# Patient Record
Sex: Male | Born: 1950 | Race: Black or African American | Hispanic: No | Marital: Single | Smoking: Former smoker
Health system: Southern US, Community
[De-identification: ages and names within clinical notes are randomized; demographics above are authoritative.]

## PROBLEM LIST (undated history)

## (undated) DIAGNOSIS — I10 Essential (primary) hypertension: Secondary | ICD-10-CM

## (undated) DIAGNOSIS — I739 Peripheral vascular disease, unspecified: Secondary | ICD-10-CM

## (undated) DIAGNOSIS — M1990 Unspecified osteoarthritis, unspecified site: Secondary | ICD-10-CM

## (undated) HISTORY — DX: Peripheral vascular disease, unspecified: I73.9

## (undated) HISTORY — PX: OTHER SURGICAL HISTORY: SHX169

---

## 2002-03-24 ENCOUNTER — Emergency Department (HOSPITAL_COMMUNITY): Admission: EM | Admit: 2002-03-24 | Discharge: 2002-03-24 | Payer: Self-pay | Admitting: Emergency Medicine

## 2002-03-25 ENCOUNTER — Emergency Department (HOSPITAL_COMMUNITY): Admission: EM | Admit: 2002-03-25 | Discharge: 2002-03-25 | Payer: Self-pay | Admitting: Emergency Medicine

## 2003-09-23 ENCOUNTER — Inpatient Hospital Stay (HOSPITAL_COMMUNITY): Admission: EM | Admit: 2003-09-23 | Discharge: 2003-09-28 | Payer: Self-pay | Admitting: Emergency Medicine

## 2003-10-10 ENCOUNTER — Encounter: Admission: RE | Admit: 2003-10-10 | Discharge: 2003-10-31 | Payer: Self-pay | Admitting: General Surgery

## 2004-01-09 ENCOUNTER — Emergency Department (HOSPITAL_COMMUNITY): Admission: EM | Admit: 2004-01-09 | Discharge: 2004-01-09 | Payer: Self-pay | Admitting: Emergency Medicine

## 2004-01-19 ENCOUNTER — Ambulatory Visit (HOSPITAL_COMMUNITY): Admission: RE | Admit: 2004-01-19 | Discharge: 2004-01-19 | Payer: Self-pay | Admitting: Family Medicine

## 2004-01-30 ENCOUNTER — Encounter: Admission: RE | Admit: 2004-01-30 | Discharge: 2004-04-29 | Payer: Self-pay | Admitting: Family Medicine

## 2004-04-01 ENCOUNTER — Encounter: Admission: RE | Admit: 2004-04-01 | Discharge: 2004-04-01 | Payer: Self-pay | Admitting: Family Medicine

## 2004-06-16 ENCOUNTER — Encounter: Admission: RE | Admit: 2004-06-16 | Discharge: 2004-09-14 | Payer: Self-pay | Admitting: Family Medicine

## 2004-06-24 ENCOUNTER — Encounter: Admission: RE | Admit: 2004-06-24 | Discharge: 2004-06-24 | Payer: Self-pay | Admitting: Family Medicine

## 2004-08-05 ENCOUNTER — Ambulatory Visit: Payer: Self-pay | Admitting: Family Medicine

## 2004-12-08 ENCOUNTER — Emergency Department (HOSPITAL_COMMUNITY): Admission: EM | Admit: 2004-12-08 | Discharge: 2004-12-08 | Payer: Self-pay | Admitting: *Deleted

## 2004-12-16 ENCOUNTER — Ambulatory Visit (HOSPITAL_COMMUNITY): Payer: Self-pay | Admitting: Psychiatry

## 2005-09-15 ENCOUNTER — Ambulatory Visit (HOSPITAL_COMMUNITY): Payer: Self-pay | Admitting: Psychiatry

## 2005-12-12 ENCOUNTER — Emergency Department (HOSPITAL_COMMUNITY): Admission: EM | Admit: 2005-12-12 | Discharge: 2005-12-12 | Payer: Self-pay | Admitting: Emergency Medicine

## 2006-04-06 ENCOUNTER — Encounter: Admission: RE | Admit: 2006-04-06 | Discharge: 2006-04-06 | Payer: Self-pay | Admitting: Pulmonary Disease

## 2006-04-28 ENCOUNTER — Encounter: Admission: RE | Admit: 2006-04-28 | Discharge: 2006-04-28 | Payer: Self-pay | Admitting: Pulmonary Disease

## 2006-05-11 ENCOUNTER — Encounter: Admission: RE | Admit: 2006-05-11 | Discharge: 2006-06-03 | Payer: Self-pay | Admitting: Family Medicine

## 2006-07-19 ENCOUNTER — Ambulatory Visit (HOSPITAL_COMMUNITY): Admission: RE | Admit: 2006-07-19 | Discharge: 2006-07-19 | Payer: Self-pay | Admitting: Orthopaedic Surgery

## 2007-01-12 ENCOUNTER — Emergency Department (HOSPITAL_COMMUNITY): Admission: EM | Admit: 2007-01-12 | Discharge: 2007-01-12 | Payer: Self-pay | Admitting: Emergency Medicine

## 2007-02-08 ENCOUNTER — Ambulatory Visit (HOSPITAL_COMMUNITY): Admission: RE | Admit: 2007-02-08 | Discharge: 2007-02-09 | Payer: Self-pay | Admitting: Orthopaedic Surgery

## 2008-09-03 ENCOUNTER — Encounter: Admission: RE | Admit: 2008-09-03 | Discharge: 2008-09-03 | Payer: Self-pay | Admitting: Orthopaedic Surgery

## 2008-10-10 ENCOUNTER — Ambulatory Visit (HOSPITAL_COMMUNITY): Admission: RE | Admit: 2008-10-10 | Discharge: 2008-10-11 | Payer: Self-pay | Admitting: Orthopaedic Surgery

## 2010-02-11 ENCOUNTER — Emergency Department (HOSPITAL_COMMUNITY): Admission: EM | Admit: 2010-02-11 | Discharge: 2010-02-11 | Payer: Self-pay | Admitting: Emergency Medicine

## 2010-02-22 ENCOUNTER — Emergency Department (HOSPITAL_COMMUNITY): Admission: EM | Admit: 2010-02-22 | Discharge: 2010-02-22 | Payer: Self-pay | Admitting: Emergency Medicine

## 2010-07-20 LAB — BASIC METABOLIC PANEL
CO2: 31 mEq/L (ref 19–32)
GFR calc Af Amer: >60 mL/min (ref >60)
Potassium: 4.6 mEq/L (ref 3.5–5.1)
Sodium: 137 mEq/L (ref 135–145)

## 2010-07-20 LAB — CBC
MCHC: 34.3 g/dL (ref 30.0–36.0)
MCV: 93.9 fL (ref 78.0–100.0)
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
## 2010-08-25 NOTE — Op Note (Signed)
NAME:  Calvin Mckee, Calvin Mckee NO.:  [id number]   MEDICAL RECORD NO.:  [id number]          PATIENT TYPE:  AMB   LOCATION:  SDS                          FACILITY:  [facility]   PHYSICIAN:  Mark C. Ophelia Charter, M.D.    DATE OF BIRTH:  10/22/1950   DATE OF PROCEDURE:  02/08/2007  DATE OF DISCHARGE:                               OPERATIVE REPORT      Mark C. Ophelia Charter, M.D.  Electronically Signed     MCY/MEDQ  D:  02/08/2007  T:  02/08/2007  Job:  [id number]

## 2010-08-25 NOTE — Op Note (Signed)
NAME:  Calvin Mckee, Calvin Mckee NO.:  [id number]   MEDICAL RECORD NO.:  [id number]          PATIENT TYPE:  OIB   LOCATION:  5020                         FACILITY:  [facility]   PHYSICIAN:  Mark C. Ophelia Charter, M.D.    DATE OF BIRTH:  11-02-1950   DATE OF PROCEDURE:  10/10/2008  DATE OF DISCHARGE:                               OPERATIVE REPORT   PREOPERATIVE DIAGNOSIS:  Status post 2-level anterior cervical fusion  with C6-7 pseudoarthrosis, painful.   POSTOPERATIVE DIAGNOSIS:  Status post 2-level anterior cervical fusion  with C6-7 pseudoarthrosis, painful.   PROCEDURE:  C6-C7 posterior cervical diskectomy and fusion, iliac  aspirate and Vitoss with spinous process wiring.   SURGEON:  Mark C. Ophelia Charter, MD   ANESTHESIA:  General.   ESTIMATED BLOOD LOSS:  200 mL.   PROCEDURE IN DETAIL:  After induction of general anesthesia, orotracheal  intubation, standard prep and draping, iliac crest and posterior  cervical spine with horseshoe head holder in prone position, arms tucked  aside with pads over the ulnar nerve.  Surgical time-out procedure was  completed.  Betadine Vi-Drape was used and preoperative antibiotics were  given.  Midline incision was made, subperiosteal dissection down and  initially C6 spinous process was rather large.  Clamps were placed at C5-  6, C4-5, and C3-4 confirmed with cross-table lateral x-ray.  Subperiosteal dissection on the lamina of C7 and C6 and the second x-ray  was taken putting one more clamp between C6-7, burning it out, so we can  see where the clamp went and this confirmed with appropriate level  counting down from the top.  Grabbing the spinous processes, there was  trace motion at C6-7 level, no motion at C5-6 as expected, and regular  motion at C4-5.   McCullough retractor was placed, 24-gauge wire was twisted into triple  strand.  Iliac aspirate was obtained with 10 mL Vitoss drip, 10 mL  moving around the iliac crest to maximize ostial  progenitor cells.  Wire  was passed, decortication on the lamina at C6-C7.  Vitoss split in half  and packed on each side.  Deep fascia was closed with 0 and #1 Vicryl, 2-  0 Vicryl in subcutaneous tissue and subcuticular skin closure.  Tincture  of Benzoin, Steri-Strips, Marcaine infiltration, postop dressing.  Soft  collar and transferred to the recovery in stable condition.  Instrument  count and needle count was correct.  Surgical time-out and checklist was  completed.      Mark C. Ophelia Charter, M.D.  Electronically Signed     MCY/MEDQ  D:  10/10/2008  T:  10/11/2008  Job:  [id number]
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
## 2010-09-01 ENCOUNTER — Emergency Department (HOSPITAL_COMMUNITY)
Admission: EM | Admit: 2010-09-01 | Discharge: 2010-09-01 | Disposition: A | Discharge status: Home / Self Care | Payer: Medicare Other | Attending: Emergency Medicine | Admitting: Emergency Medicine

## 2010-09-01 DIAGNOSIS — I10 Essential (primary) hypertension: Secondary | ICD-10-CM | POA: Insufficient documentation

## 2010-09-01 DIAGNOSIS — E7800 Pure hypercholesterolemia, unspecified: Secondary | ICD-10-CM | POA: Insufficient documentation

## 2010-09-01 DIAGNOSIS — K047 Periapical abscess without sinus: Secondary | ICD-10-CM | POA: Insufficient documentation

## 2010-09-01 DIAGNOSIS — K0401 Reversible pulpitis: Secondary | ICD-10-CM | POA: Insufficient documentation

## 2010-09-01 DIAGNOSIS — K089 Disorder of teeth and supporting structures, unspecified: Secondary | ICD-10-CM | POA: Insufficient documentation

## 2010-09-23 ENCOUNTER — Other Ambulatory Visit: Payer: Self-pay

## 2010-09-23 ENCOUNTER — Emergency Department (HOSPITAL_COMMUNITY)
Admission: EM | Admit: 2010-09-23 | Discharge: 2010-09-23 | Disposition: A | Discharge status: Home / Self Care | Payer: Medicare Other | Attending: Emergency Medicine | Admitting: Emergency Medicine

## 2010-09-23 DIAGNOSIS — F4310 Post-traumatic stress disorder, unspecified: Secondary | ICD-10-CM

## 2010-09-23 DIAGNOSIS — E7800 Pure hypercholesterolemia, unspecified: Secondary | ICD-10-CM | POA: Insufficient documentation

## 2010-09-23 DIAGNOSIS — Z139 Encounter for screening, unspecified: Secondary | ICD-10-CM | POA: Insufficient documentation

## 2010-09-23 DIAGNOSIS — I10 Essential (primary) hypertension: Secondary | ICD-10-CM | POA: Insufficient documentation

## 2010-09-23 DIAGNOSIS — IMO0002 Reserved for concepts with insufficient information to code with codable children: Secondary | ICD-10-CM | POA: Insufficient documentation

## 2010-09-23 DIAGNOSIS — F1910 Other psychoactive substance abuse, uncomplicated: Secondary | ICD-10-CM | POA: Insufficient documentation

## 2010-09-23 LAB — DIFFERENTIAL
Basophils Absolute: 0 10*3/uL (ref 0.0–0.1)
Basophils Relative: 1 % (ref 0–1)
Eosinophils Relative: 2 % (ref 0–5)
Lymphocytes Relative: 59 % — ABNORMAL HIGH (ref 12–46)
Monocytes Absolute: 0.3 10*3/uL (ref 0.1–1.0)
Neutro Abs: 2.2 10*3/uL (ref 1.7–7.7)

## 2010-09-23 LAB — RAPID URINE DRUG SCREEN, HOSP PERFORMED
Barbiturates: NOT DETECTED
Cocaine: POSITIVE — AB
Tetrahydrocannabinol: NOT DETECTED

## 2010-09-23 LAB — BASIC METABOLIC PANEL
BUN: 5 mg/dL — ABNORMAL LOW (ref 6–23)
CO2: 25 mEq/L (ref 19–32)
Calcium: 9.1 mg/dL (ref 8.4–10.5)
GFR calc Af Amer: >60 mL/min (ref >60)
GFR calc non Af Amer: >60 mL/min (ref >60)

## 2010-09-23 LAB — URINALYSIS, ROUTINE W REFLEX MICROSCOPIC
Glucose, UA: NEGATIVE mg/dL
Leukocytes, UA: NEGATIVE
Nitrite: NEGATIVE
Specific Gravity, Urine: 1.009 (ref 1.005–1.030)

## 2010-09-23 LAB — CBC
MCH: 31.8 pg (ref 26.0–34.0)
MCV: 87.3 fL (ref 78.0–100.0)
Platelets: 264 10*3/uL (ref 150–400)
RBC: 4.81 MIL/uL (ref 4.22–5.81)
WBC: 6.4 10*3/uL (ref 4.0–10.5)

## 2010-10-09 ENCOUNTER — Emergency Department (HOSPITAL_COMMUNITY)
Admission: EM | Admit: 2010-10-09 | Discharge: 2010-10-09 | Disposition: A | Discharge status: Home / Self Care | Payer: Medicare Other | Attending: Emergency Medicine | Admitting: Emergency Medicine

## 2010-10-09 DIAGNOSIS — R002 Palpitations: Secondary | ICD-10-CM | POA: Insufficient documentation

## 2010-10-09 DIAGNOSIS — Z7982 Long term (current) use of aspirin: Secondary | ICD-10-CM | POA: Insufficient documentation

## 2010-10-09 DIAGNOSIS — E7800 Pure hypercholesterolemia, unspecified: Secondary | ICD-10-CM | POA: Insufficient documentation

## 2010-10-09 DIAGNOSIS — G8929 Other chronic pain: Secondary | ICD-10-CM | POA: Insufficient documentation

## 2010-10-09 DIAGNOSIS — M549 Dorsalgia, unspecified: Secondary | ICD-10-CM | POA: Insufficient documentation

## 2010-10-09 DIAGNOSIS — I10 Essential (primary) hypertension: Secondary | ICD-10-CM | POA: Insufficient documentation

## 2010-10-09 DIAGNOSIS — Z79899 Other long term (current) drug therapy: Secondary | ICD-10-CM | POA: Insufficient documentation

## 2010-10-09 LAB — RAPID URINE DRUG SCREEN, HOSP PERFORMED
Barbiturates: NOT DETECTED
Opiates: NOT DETECTED
Tetrahydrocannabinol: NOT DETECTED

## 2010-10-09 LAB — URINALYSIS, ROUTINE W REFLEX MICROSCOPIC
Hgb urine dipstick: NEGATIVE
Leukocytes, UA: NEGATIVE
Nitrite: NEGATIVE
pH: 6.5 (ref 5.0–8.0)

## 2010-10-09 LAB — POCT I-STAT, CHEM 8
BUN: 7 mg/dL (ref 6–23)
Calcium, Ion: 1.2 mmol/L (ref 1.12–1.32)
Chloride: 99 mEq/L (ref 96–112)
Creatinine, Ser: 1 mg/dL (ref 0.50–1.35)
Hemoglobin: 15.3 g/dL (ref 13.0–17.0)
Potassium: 3.7 mEq/L (ref 3.5–5.1)
TCO2: 30 mmol/L (ref 0–100)

## 2011-01-20 LAB — DIFFERENTIAL
Basophils Relative: 1
Lymphocytes Relative: 52 — ABNORMAL HIGH
Lymphs Abs: 2.7
Monocytes Absolute: 0.4

## 2011-01-20 LAB — COMPREHENSIVE METABOLIC PANEL
ALT: 27
AST: 27
Albumin: 4
CO2: 30
GFR calc non Af Amer: >60
Total Bilirubin: 0.6
Total Protein: 7.3

## 2011-01-20 LAB — PROTIME-INR
INR: 1
Prothrombin Time: 13.1

## 2011-01-20 LAB — URINALYSIS, ROUTINE W REFLEX MICROSCOPIC
Glucose, UA: NEGATIVE
Hgb urine dipstick: NEGATIVE
Ketones, ur: NEGATIVE
Nitrite: NEGATIVE
Specific Gravity, Urine: 1.017
Urobilinogen, UA: 1
pH: 6.5

## 2011-01-20 LAB — CBC
HCT: 41.4
Hemoglobin: 14.1
Platelets: 292
RBC: 4.56
WBC: 5.2

## 2011-01-20 LAB — APTT: aPTT: 32

## 2011-01-21 LAB — I-STAT 8, (EC8 V) (CONVERTED LAB)
Acid-Base Excess: 3 — ABNORMAL HIGH
BUN: 6
Bicarbonate: 31.2 — ABNORMAL HIGH
Chloride: 104
Glucose, Bld: 85
HCT: 45
Hemoglobin: 15.3
Operator id: 146091
Potassium: 4.3
Sodium: 140
TCO2: 33
pCO2, Ven: 64.6 — ABNORMAL HIGH
pH, Ven: 7.292

## 2011-01-21 LAB — POCT CARDIAC MARKERS
CKMB, poc: 1.1
Myoglobin, poc: 109
Operator id: 161631
Troponin i, poc: <0.05

## 2011-01-21 LAB — POCT I-STAT CREATININE
Creatinine, Ser: 1
Operator id: 146091

## 2011-01-21 LAB — D-DIMER, QUANTITATIVE: D-Dimer, Quant: <0.22

## 2014-07-20 ENCOUNTER — Encounter (HOSPITAL_COMMUNITY): Payer: Self-pay | Admitting: *Deleted

## 2014-07-20 ENCOUNTER — Emergency Department (HOSPITAL_COMMUNITY)
Admission: EM | Admit: 2014-07-20 | Discharge: 2014-07-20 | Disposition: A | Discharge status: Home / Self Care | Payer: Medicare Other | Attending: Emergency Medicine | Admitting: Emergency Medicine

## 2014-07-20 DIAGNOSIS — K047 Periapical abscess without sinus: Secondary | ICD-10-CM | POA: Diagnosis not present

## 2014-07-20 DIAGNOSIS — I10 Essential (primary) hypertension: Secondary | ICD-10-CM | POA: Insufficient documentation

## 2014-07-20 DIAGNOSIS — Z720 Tobacco use: Secondary | ICD-10-CM | POA: Insufficient documentation

## 2014-07-20 DIAGNOSIS — K088 Other specified disorders of teeth and supporting structures: Secondary | ICD-10-CM | POA: Diagnosis present

## 2014-07-20 DIAGNOSIS — K0889 Other specified disorders of teeth and supporting structures: Secondary | ICD-10-CM

## 2014-07-20 DIAGNOSIS — K029 Dental caries, unspecified: Secondary | ICD-10-CM | POA: Insufficient documentation

## 2014-07-20 HISTORY — DX: Essential (primary) hypertension: I10

## 2014-07-20 MED ORDER — CLINDAMYCIN HCL 150 MG PO CAPS: 450.0000 mg | ORAL_CAPSULE | Freq: Three times a day (TID) | ORAL | Status: DC

## 2014-07-20 NOTE — ED Provider Notes (Signed)
CSN: [id number]     Arrival date & time 07/20/14  1016 History   First MD Initiated Contact with Patient 07/20/14 1022     Chief Complaint  Patient presents with  . Dental Problem     (Consider location/radiation/quality/duration/timing/severity/associated sxs/prior Treatment) The history is provided by the patient. No language interpreter was used.  Calvin Mckee is a 64 year old male with past medical history of hypertension presenting to the ED with dental pain that is been ongoing for approximately one week. Patient reports he believes that he has an abscess to his right mandibular jawline with radiation of discomfort to the right side of the neck described as a soreness. Patient reported that he wears partials and stated that the partial has been irritating his gumline resulting in an abscess formation. Reported that there is no pain at this time, states that every once in a while he has a sharp pain, worse with wearing his partials. Stated that he's been using peroxide for cleaning the mouth. Reported that he normally goes to the [place]. Reported that his last dental visit was in 2011. Denied pus drainage, bleeding, fever, chills, chest pain, shortness of breath, difficulty breathing, facial swelling, sore throat, difficulty swallowing, cough, tongue swelling, blurred vision, sudden loss of vision, numbness, tingling, neck pain, neck stiffness, throat closing sensation[facility]  Past [facility] History  Diagnosis Date  . Hypertension    History reviewed. No pertinent past surgical history. History reviewed. No pertinent family history. History  Substance Use Topics  . Smoking status: Current Every Day Smoker    Types: Cigarettes  . Smokeless tobacco: Never Used  . Alcohol Use: No    Review of Systems  Constitutional: Negative for fever and chills.  HENT: Positive for dental problem. Negative for facial swelling, sore throat and trouble swallowing.   Eyes: Negative for visual  disturbance.  Respiratory: Negative for chest tightness and shortness of breath.   Cardiovascular: Negative for chest pain.  Neurological: Negative for dizziness, weakness, numbness and headaches.      Allergies  Review of patient's allergies indicates not on file.  Home Medications   Prior to Admission medications   Medication Sig Start Date End Date Taking? Authorizing Provider  clindamycin (CLEOCIN) 150 MG capsule Take 3 capsules (450 mg total) by mouth 3 (three) times daily. 07/20/14   Breion Novacek, PA-C   BP 151/82 mmHg  Pulse 92  Temp(Src) 97.6 F (36.4 C) (Oral)  Resp 20  Ht 5' 8.5" (1.74 m)  Wt 183 lb (83.008 kg)  BMI 27.42 kg/m2  SpO2 99% Physical Exam  Constitutional: He is oriented to person, place, and time. He appears well-developed and well-nourished. No distress.  HENT:  Head: Normocephalic and atraumatic.  Mouth/Throat: Oropharynx is clear and moist. He has dentures (Partial). No oral lesions. No trismus in the jaw. Normal dentition. Dental abscesses and dental caries present. No uvula swelling or lacerations. No oropharyngeal exudate.    Poor dentition identified with numerous teeth missing. Mild area of swelling identified to the right mandibular jawline, near the rightcanine-negative erythema, active drainage or bleeding noted. Negative drainable abscess noted at this time. Tenderness upon palpation to this region. Negative diagramming of tooth or decaying of tooth. Uvula midline with symmetrical elevation. Negative uvula swelling. Negative deviation. Negative trismus. Negative sublingual lesions.  Eyes: Conjunctivae and EOM are normal. Pupils are equal, round, and reactive to light. Right eye exhibits no discharge. Left eye exhibits no discharge.  Neck: Normal range of motion.  Neck supple. No tracheal deviation present.    Negative neck stiffness Negative nuchal rigidity Negative cervical lymphadenopathy Negative meningeal signs Negative crepitus upon  palpation  Tenderness upon palpation to the right side of the neck with negative changes to skin color, warmth, erythema, abnormalities.  Cardiovascular: Normal rate, regular rhythm and normal heart sounds.  Exam reveals no friction rub.   No murmur heard. Pulses:      Radial pulses are 2+ on the right side, and 2+ on the left side.  Pulmonary/Chest: Effort normal and breath sounds normal. No respiratory distress. He has no wheezes. He has no rales.  Patient is able to speak in full sentences without difficulty Negative use of accessory muscles Negative stridor  Musculoskeletal: Normal range of motion.  Lymphadenopathy:    He has no cervical adenopathy.  Neurological: He is alert and oriented to person, place, and time. No cranial nerve deficit. He exhibits normal muscle tone. Coordination normal.  Cranial nerves III-XII grossly intact Patient follow commands well Patient responds to questions appropriately Negative facial droop Negative slurred speech Negative aphasia  Skin: Skin is warm and dry. No rash noted. He is not diaphoretic. No erythema.  Psychiatric: He has a normal mood and affect. His behavior is normal. Thought content normal.  Nursing note and vitals reviewed.   ED Course  Procedures (including critical care time) Labs Review Labs Reviewed - No data to display  Imaging Review No results found.   EKG Interpretation None      MDM   Final diagnoses:  Pain, dental    Medications - No data to display  Filed Vitals:   07/20/14 1021  BP: 151/82  Pulse: 92  Temp: 97.6 F (36.4 C)  TempSrc: Oral  Resp: 20  Height: 5' 8.5" (1.74 m)  Weight: 183 lb (83.008 kg)  SpO2: 99%   Patient presenting to the ED with what appears to be a dental abscess localized to the right mandibular canine. Negative active drainage or bleeding noted at this time-no drainable abscess noted at this time. Mild tenderness upon palpation with negative erythema. Mild swelling noted.  Negative lymphadenopathy identified. Mild discomfort upon palpation to the right side of the neck with negative swelling or skin changes. Patient wears partials. Negative findings of sublingual lesions-doubt Ludwig's angina. This provider recommended CT soft tissue neck with contrast to be performed throughout possible abscess or infection in the neck - patient refusing examination and lab workup. Patient reported that he is fine and only wanted antibiotics. This provider had a long conversation with patient regarding possible risk of infection in the neck or worsening symptoms, patient continued to refuse labs and imaging. This provider discussed with patient possible fatality if there is an ongoing infection, patient understood and continued to decline imaging. Patient is medically capable of making decisions. Patient stable, afebrile. Patient not septic appearing. Negative signs of respiratory distress. Patient to be signed Sharon. Patient prescribed antibiotics. Referral to [facility] wellness [facility] and dentist given. Discussed with patient to apply warm compressions. Discussed with patient to avoid using partial since the partial's results in increased pain and could be the reason for irritation. Discussed with patient to closely monitor symptoms and if symptoms are to worsen or change to report back to the ED - strict return instructions given.  Patient agreed to plan of care, understood, all questions answered.   Jamse Mead, PA-C 07/20/14 [address] PA-C 07/20/14 [place], MD 07/20/14 [phone]

## 2014-07-20 NOTE — Discharge Instructions (Signed)
Please call your doctor for a followup appointment within 24-48 hours. When you talk to your doctor please let them know that you were seen in the emergency department and have them acquire all of your records so that they can discuss the findings with you and formulate a treatment plan to fully care for your new and ongoing problems. Please follow-up with health and wellness Center Please follow-up with dentist and oral surgeon Please take antibiotics as prescribed and on a full stomach Please apply warm compressions and massage to relieve swelling Please continue to monitor symptoms closely and if symptoms are to worsen or change (fever greater than 101, chills, sweating, nausea, vomiting, chest pain, shortness of breathe, difficulty breathing, weakness, numbness, tingling, worsening or changes to pain pattern, facial swelling, changes to skin colored, neck pain, neck stiffness, neck swelling, difficulty swallowing, throat closing sensation, tongue swelling, swelling underneath the tongue) please report back to the Emergency Department immediately.    Dental Pain A tooth ache may be caused by cavities (tooth decay). Cavities expose the nerve of the tooth to air and hot or cold temperatures. It may come from an infection or abscess (also called a boil or furuncle) around your tooth. It is also often caused by dental caries (tooth decay). This causes the pain you are having. DIAGNOSIS  Your caregiver can diagnose this problem by exam. TREATMENT   If caused by an infection, it may be treated with medications which kill germs (antibiotics) and pain medications as prescribed by your caregiver. Take medications as directed.  Only take over-the-counter or prescription medicines for pain, discomfort, or fever as directed by your caregiver.  Whether the tooth ache today is caused by infection or dental disease, you should see your dentist as soon as possible for further care. SEEK MEDICAL CARE IF: The  exam and treatment you received today has been provided on an emergency basis only. This is not a substitute for complete medical or dental care. If your problem worsens or new problems (symptoms) appear, and you are unable to meet with your dentist, call or return to this location. SEEK IMMEDIATE MEDICAL CARE IF:   You have a fever.  You develop redness and swelling of your face, jaw, or neck.  You are unable to open your mouth.  You have severe pain uncontrolled by pain medicine. MAKE SURE YOU:   Understand these instructions.  Will watch your condition.  Will get help right away if you are not doing well or get worse. Document Released: 03/29/2005 Document Revised: 06/21/2011 Document Reviewed: 11/15/2007 [facility] Patient Information 2015 [place], [place]. This information is not intended to replace advice given to you by your health care provider. Make sure you discuss any questions you have with your health care provider.   Emergency Department Resource Guide 1) Find a Doctor and Pay Out of Pocket Although you won't have to find out who is covered by your insurance plan, it is a good idea to ask around and get recommendations. You will then need to call the office and see if the doctor you have chosen will accept you as a new patient and what types of options they offer for patients who are self-pay. Some doctors offer discounts or will set up payment plans for their patients who do not have insurance, but you will need to ask so you aren't surprised when you get to your appointment.  2) Contact Your Local Health Department Not all health departments have doctors that can see patients  for sick visits, but many do, so it is worth a call to see if yours does. If you don't know where your local health department is, you can check in your phone book. The CDC also has a tool to help you locate your state's health department, and many state websites also have listings of all of their local  health departments.  3) Find a [facility] If your illness is not likely to be very severe or complicated, you may want to try a walk in clinic. These are popping up all over the country in pharmacies, drugstores, and shopping centers. They're usually staffed by nurse practitioners or physician assistants that have been trained to treat common illnesses and complaints. They're usually fairly quick and inexpensive. However, if you have serious medical issues or chronic medical problems, these are probably not your best option.  No Primary Care Doctor: - Call Health Connect at  [phone] - they can help you locate a primary care doctor that  accepts your insurance, provides certain services, etc. - Physician Referral Service- [phone]  Chronic Pain Problems: Organization         Address  Phone   Notes  [facility]  [phone] Patients need to be referred by their primary care doctor.   Medication Assistance: Organization         Address  Phone   Notes  [facility] [address] [place], [place] [address] [phone] --Must be a resident of [facility] -- Must have NO insurance coverage whatsoever (no Medicaid/ Medicare, etc.) -- The pt. MUST have a primary care doctor that directs their care regularly and follows them in the community   MedAssist  [phone]   [employer]  [phone]    Agencies that provide inexpensive medical care: Organization         Address  Phone   Notes  [place]  [phone]   [facility]    [phone]   [facility] [place], [place] [address] [phone]   [place] [address], [place] [phone]   Planned Parenthood    [phone]   [facility]    [phone]   [place] [address], [place] Phone:   [phone], Fax:  [phone] Hours of Operation:  9 am - 6 pm, M-F.  Also accepts Medicaid/Medicare and self-pay.  [facility] for [place] [address], [address], [place] Phone: [phone], Fax: [phone]. Hours of Operation:  8:30 am - 5:30 pm, M-F.  Also accepts Medicaid and self-pay.  [facility] [place] [address], [place] Phone: [phone]   [place], [place], [place] [phone], Ext. [phone] Mondays & Thursdays: 7-9 AM.  First 15 patients are seen on a first come, first serve basis.    [place] Providers:  Organization         Address  Phone   Notes  [facility] [address], [place] [phone] Also accepts self-pay patients.  [place], [place]  [phone]   [place], [address], [place] [phone]   [facility] [address], [place] [phone])  [place] [address] [place]   [phone] Only accepts [place] patients after they have their name applied to their card.   Self-Pay (no insurance) in [facility]:  Organization         Address  Phone   Notes  Sickle Cell Patients, [facility] [place] [phone]   [facility] [place] [phone]   [facility] [place]  [place], [place], [place] [phone]   [facility]/Dr. Osei-Bonsu  [address] [place] or [address], [place] [phone] Phone number for both [place] and [place] locations is the same.  [facility] [address], [place] [phone]   [facility] [address], [place] or [address] [phone]   [facility] [address] [place] [phone], phone; [phone], fax Sees patients 1st and 3rd Saturday of every month.  Must not qualify for public or private insurance (i.e. Medicaid, Medicare, [place] Health Choice, Veterans' Benefits)  Household income should be no more than 200% of the poverty level The clinic cannot treat you if you are pregnant or think you are pregnant  Sexually transmitted diseases are not treated at the clinic.    Dental Care: Organization         Address  Phone  Notes  [facility] [place] [phone] Accepts children up to age 28 who are enrolled in [place] or [place]; pregnant women with a Medicaid card; and children who have applied for Medicaid or [place] Health Choice, but were declined, whose parents can pay a reduced fee at time of service.  [facility]  [address], [place] [phone] Accepts children up to age 24 who are enrolled in [place] or [place]; pregnant women with a Medicaid card; and children who have applied for Medicaid or [place] Health Choice, but were declined, whose parents can pay a reduced fee at time of service.  [facility] Adult [facility] Access PROGRAM  [place] [phone] Patients are seen by appointment only. Walk-ins are not accepted. [place] will see patients 70 years of age and older. Monday - Tuesday (8am-5pm) Most Wednesdays (8:30-5pm) $30 per visit, cash only  [facility] Adult Dental Access PROGRAM  [address], [facility] [phone] Patients are seen by appointment only. Walk-ins are not accepted. [place] will see patients 67 years of age and older. One Wednesday Evening (Monthly: Volunteer Based).  $30 per visit, cash only  [place]  [phone] for adults;  Children under age 59, call Graduate Pediatric Dentistry at [phone]. Children aged 13-14, please call [phone] to request a pediatric application.  Dental services are provided in all areas of dental care including fillings, crowns and bridges, complete and partial dentures, implants, gum treatment, root canals, and extractions. Preventive care is also provided. Treatment is provided to both adults and children. Patients are selected via a lottery and there is often a waiting list.   [facility] [address], [place]  [phone] [email or web address], [place],  [place] [phone], Ext. [phone] Second and Fourth Thursday of each month, opens at 6:30 AM; Clinic ends at 9 AM.  Patients are seen on a first-come first-served basis, and a limited number are seen during each clinic.   [facility]  [address] [place] Danker [place], [place] [phone]   Eligibility Requirements You must have lived in [place], [place], or [place] counties for at least the last three months.   You cannot be eligible for state or federal sponsored [employer], including [employer], [place], or [employer].   You generally cannot be eligible for healthcare insurance through your employer.    How to apply: Eligibility screenings are held every Tuesday and Wednesday afternoon from 1:00 pm until 4:00 pm. You do not need an appointment for the interview!  [facility] [address] [place], [place]   [place]  [place] Department  [place]  [phone]    Behavioral Health Resources in the Community: Intensive Outpatient Programs Organization         Address  Phone  Notes  [place] [place]. [address], [place], [place] [phone]   [facility] Outpatient [address] [place], [place]   ADS: Alcohol & Drug Svcs [address] [place], [place]   [place] [address]  [place], [place] or [phone]   Substance Abuse Resources Organization         Address  Phone  Notes  Alcohol and Drug Services  [phone]   [place]  [phone]   The [place]   Chinita Pester  [phone]   Residential & Outpatient Substance Abuse Program  [phone]   Psychological Services Organization         Address  Phone  Notes  [facility]  [place]  [phone]   [place] [address] [place] or [phone]    Mobile Crisis Teams Organization         Address  Phone  Notes  [facility], Mobile Crisis Care Unit  [phone]   [facility]  [address] [place], [place]   [place] [address] [place] [phone]    Self-Help/Support Groups Organization         Address  Phone             Notes  [place]. of [place] - variety of support groups  [place] Call for more information  Narcotics Anonymous (NA), [facility] [address], [employer] [place]  2 meetings at this location   Special educational needs teacher         Address  Phone  Notes  [facility]AP Residential Treatment [place],    [place]  [phone]   [facility]  [address], [place] [id number], [place], [place]   [place], [place] [phone] Admissions: 8am-3pm M-F  Incentives Substance [place] [address],    [place], [place] [phone]   [facility] [address] Jadene Pierini [place], [place]   [place].,  [place], [place] - Intensive  Outpatient [address] Kristeen [address], [place], [place]   [place] (  [facility] [place].,  [place], [place] [phone] or [phone]   [facility]idential Treatment Services (RTS) [address], [place], [place] Accepts Medicaid  [facility] [place] [address],  [place] [phone] Substance Abuse/Addiction Treatment   [facility] Organization         Address  Phone  Notes  [facility]  [phone]   Domenic Schwab, PhD [address] [place], [place]   [phone] or [phone]   [place], [place] [phone]   [address], [place], [place] [phone] Insurance/Medicaid/sponsorship through [facility] [place] and Families [address], [address] [place]                                    [place], [place] [phone] [place] [address], [place] [phone]    Dr. Adele Schilder  [phone]   [facility] Dept. 1) [address], [place] 2) [place] 3)  [address], [place] [phone]  [phone]   [place] [phone] or [phone] (After Hours)

## 2014-07-20 NOTE — ED Notes (Signed)
Declined W/C at D/C and was escorted to lobby by RN. 

## 2014-07-20 NOTE — ED Notes (Signed)
Pt refuses CT

## 2014-07-20 NOTE — ED Notes (Signed)
Pt reports his gum is abscessed on RT lower.

## 2014-09-18 ENCOUNTER — Emergency Department (HOSPITAL_COMMUNITY): Payer: Medicare Other

## 2014-09-18 ENCOUNTER — Encounter (HOSPITAL_COMMUNITY): Payer: Self-pay | Admitting: Emergency Medicine

## 2014-09-18 ENCOUNTER — Emergency Department (HOSPITAL_COMMUNITY)
Admission: EM | Admit: 2014-09-18 | Discharge: 2014-09-18 | Disposition: A | Discharge status: Home / Self Care | Payer: Medicare Other | Attending: Emergency Medicine | Admitting: Emergency Medicine

## 2014-09-18 DIAGNOSIS — R52 Pain, unspecified: Secondary | ICD-10-CM

## 2014-09-18 DIAGNOSIS — Z792 Long term (current) use of antibiotics: Secondary | ICD-10-CM | POA: Insufficient documentation

## 2014-09-18 DIAGNOSIS — M654 Radial styloid tenosynovitis [de Quervain]: Secondary | ICD-10-CM

## 2014-09-18 DIAGNOSIS — M1990 Unspecified osteoarthritis, unspecified site: Secondary | ICD-10-CM | POA: Insufficient documentation

## 2014-09-18 DIAGNOSIS — I10 Essential (primary) hypertension: Secondary | ICD-10-CM | POA: Diagnosis not present

## 2014-09-18 DIAGNOSIS — Z720 Tobacco use: Secondary | ICD-10-CM | POA: Insufficient documentation

## 2014-09-18 DIAGNOSIS — M79641 Pain in right hand: Secondary | ICD-10-CM | POA: Diagnosis present

## 2014-09-18 HISTORY — DX: Unspecified osteoarthritis, unspecified site: M19.90

## 2014-09-18 MED ORDER — KETOROLAC TROMETHAMINE 60 MG/2ML IM SOLN
30.0000 mg | Freq: Once | INTRAMUSCULAR | Status: AC
  Administered 2014-09-18: 30 mg via INTRAMUSCULAR
  Filled 2014-09-18: qty 2

## 2014-09-18 MED ORDER — IBUPROFEN 400 MG PO TABS: 400.0000 mg | ORAL_TABLET | Freq: Four times a day (QID) | ORAL | Status: DC | PRN

## 2014-09-18 NOTE — ED Notes (Signed)
Right hand pain x 1 month. Pt goes to [place] for primary care.

## 2014-09-18 NOTE — Discharge Instructions (Signed)
Return to the emergency room with worsening of symptoms, new symptoms or with symptoms that are concerning, especially fevers, redness, swelling, numbness, tingling, weakness, unable to move hand. RICE: Rest, Ice (three cycles of 20 mins on, 63mins off at least twice a day), compression/brace, elevation. Heating pad works well for back pain. Ibuprofen 400mg  (2 tablets 200mg ) every 5-6 hours for 3-5 days. Call to make follow-up appointment with orthopedics. Wear your hand brace at night. Read below information and follow recommendations.  De Quervain's Tenosynovitis De Quervain's tenosynovitis involves inflammation of one or two tendon linings (sheaths) or strain of one or two tendons to the thumb: extensor pollicis brevis (EPB), or abductor pollicis longus (APL). This causes pain on the side of the wrist and base of the thumb. Tendon sheaths secrete a fluid that lubricates the tendon, allowing the tendon to move smoothly. When the sheath becomes inflamed, the tendon cannot move freely in the sheath. Both the EPB and APL tendons are important for proper use of the hand. The EPB tendon is important for straightening the thumb. The APL tendon is important for moving the thumb away from the index finger (abducting). The two tendons pass through a small tube (canal) in the wrist, near the base of the thumb. When the tendons become inflamed, pain is usually felt in this area. SYMPTOMS   Pain, tenderness, swelling, warmth, or redness over the base of the thumb and thumb side of the wrist.  Pain that gets worse when straightening the thumb.  Pain that gets worse when moving the thumb away from the index finger, against resistance.  Pain with pinching or gripping.  Locking or catching of the thumb.  Limited motion of the thumb.  Crackling sound (crepitation) when the tendon or thumb is moved or touched.  Fluid-filled cyst in the area of the base of the thumb. CAUSES   Tenosynovitis is often linked  with overuse of the wrist.  Tenosynovitis may be caused by repeated injury to the thumb muscle and tendon units, and with repeated motions of the hand and wrist, due to friction of the tendon within the lining (sheath).  Tenosynovitis may also be due to a sudden increase in activity or change in activity. RISK INCREASES WITH:  Sports that involve repeated hand and wrist motions (golf, bowling, tennis, squash, racquetball).  Heavy labor.  Poor physical wrist strength and flexibility.  Failure to warm up properly before practice or play.  Male gender.  New mothers who hold their baby's head for long periods or lift infants with thumbs in the infant's armpit (axilla). PREVENTION  Warm up and stretch properly before practice or competition.  Allow enough time for rest and recovery between practices and competition.  Maintain appropriate conditioning:  Cardiovascular fitness.  Forearm, wrist, and hand flexibility.  Muscle strength and endurance.  Use proper exercise technique. PROGNOSIS  This condition is usually curable within 6 weeks, if treated properly with non-surgical treatment and resting of the affected area.  RELATED COMPLICATIONS   Longer healing time if not properly treated or if not given enough time to heal.  Chronic inflammation, causing recurring symptoms of tenosynovitis. Permanent pain or restriction of movement.  Risks of surgery: infection, bleeding, injury to nerves (numbness of the thumb), continued pain, incomplete release of the tendon sheath, recurring symptoms, cutting of the tendons, tendons sliding out of position, weakness of the thumb, thumb stiffness. TREATMENT  First, treatment involves the use of medicine and ice, to reduce pain and inflammation. Patients are  encouraged to stop or modify activities that aggravate the injury. Stretching and strengthening exercises may be advised. Exercises may be completed at home or with a therapist. You may be  fitted with a brace or splint, to limit motion and allow the injury to heal. Your caregiver may also choose to give you a corticosteroid injection, to reduce the pain and inflammation. If non-surgical treatment is not successful, surgery may be needed. Most tenosynovitis surgeries are done as outpatient procedures (you go home the same day). Surgery may involve local, regional (whole arm), or general anesthesia.  MEDICATION   If pain medicine is needed, nonsteroidal anti-inflammatory medicines (aspirin and ibuprofen), or other minor pain relievers (acetaminophen), are often advised.  Do not take pain medicine for 7 days before surgery.  Prescription pain relievers are often prescribed only after surgery. Use only as directed and only as much as you need.  Corticosteroid injections may be given if your caregiver thinks they are needed. There is a limited number of times these injections may be given. COLD THERAPY   Cold treatment (icing) should be applied for 10 to 15 minutes every 2 to 3 hours for inflammation and pain, and immediately after activity that aggravates your symptoms. Use ice packs or an ice massage. SEEK MEDICAL CARE IF:   Symptoms get worse or do not improve in 2 to 4 weeks, despite treatment.  You experience pain, numbness, or coldness in the hand.  Blue, gray, or dark color appears in the fingernails.  Any of the following occur after surgery: increased pain, swelling, redness, drainage of fluids, bleeding in the affected area, or signs of infection.  New, unexplained symptoms develop. (Drugs used in treatment may produce side effects.) Document Released: 03/29/2005 Document Revised: 06/21/2011 Document Reviewed: 07/11/2008 [facility] Patient Information 2015 [place], [place]. This information is not intended to replace advice given to you by your health care provider. Make sure you discuss any questions you have with your health care provider.

## 2014-09-18 NOTE — ED Notes (Signed)
Declined W/C at D/C and was escorted to lobby by RN. 

## 2014-09-18 NOTE — ED Provider Notes (Signed)
CSN: [id number]     Arrival date & time 09/18/14  6967 History  This chart was scribed for non-physician practitioner, Al Corpus, PA-C working with Francine Graven, DO by Rayna Sexton, ED scribe. This patient was seen in room TR07C/TR07C and the patient's care was started at 10:46 AM.   Chief Complaint  Patient presents with  . Hand Pain    The history is provided by the patient. No language interpreter was used.    HPI Comments: Calvin Mckee is a 64 y.o. male, with a history of arthritis, who presents to the Emergency Department complaining of constant, mild, burning right hand pain with onset 3 weeks ago. Pt notes worsening symptoms when using the hand and confirms associated swelling and loss of ROM. He notes taking OTC pain medications for symptoms with no relief. Pt denies fever or chills. No numbness, tingling, weakness.   Past Medical History  Diagnosis Date  . Hypertension   . Arthritis    Past Surgical History  Procedure Laterality Date  . Cervical fusion     No family history on file. History  Substance Use Topics  . Smoking status: Current Every Day Smoker    Types: Cigarettes  . Smokeless tobacco: Never Used  . Alcohol Use: No    Review of Systems  Constitutional: Negative for fever and chills.  Musculoskeletal: Positive for myalgias, joint swelling and arthralgias.  Neurological: Negative for weakness and numbness.      Allergies  Review of patient's allergies indicates no known allergies.  Home Medications   Prior to Admission medications   Medication Sig Start Date End Date Taking? Authorizing Provider  clindamycin (CLEOCIN) 150 MG capsule Take 3 capsules (450 mg total) by mouth 3 (three) times daily. 07/20/14   Marissa Sciacca, PA-C  ibuprofen (ADVIL,MOTRIN) 400 MG tablet Take 1 tablet (400 mg total) by mouth every 6 (six) hours as needed. 09/18/14   Al Corpus, PA-C   BP 147/71 mmHg  Pulse 80  Temp(Src) 98.1 F (36.7 C) (Oral)  Resp  16  Ht 5\' 8"  (1.727 m)  Wt 178 lb (80.74 kg)  BMI 27.07 kg/m2  SpO2 98% Physical Exam  Constitutional: He appears well-developed and well-nourished. No distress.  HENT:  Head: Normocephalic and atraumatic.  Eyes: Conjunctivae are normal. Right eye exhibits no discharge. Left eye exhibits no discharge.  Cardiovascular: Normal rate, regular rhythm and normal heart sounds.   2+ radial pulses bilaterally   Pulmonary/Chest: Effort normal. No respiratory distress.  Musculoskeletal:  Positive Finkelstein's test Negative Tinel's and Phalen's signs Full ROM of right lower extremity without swelling or erythema 5/5 strength in bilateral upper extremity. Sensation intact.   Neurological: He is alert. Coordination normal.  Skin: He is not diaphoretic.  Psychiatric: He has a normal mood and affect. His behavior is normal.  Nursing note and vitals reviewed.   ED Course  Procedures  DIAGNOSTIC STUDIES: Oxygen Saturation is 98% on RA, normal by my interpretation.    COORDINATION OF CARE: 10:51 AM Discussed treatment plan with pt at bedside and pt agreed to plan.  Labs Review Labs Reviewed - No data to display  Imaging Review Dg Wrist Complete Right  09/18/2014   CLINICAL DATA:  64 year old male with wrist pain and swelling for 1 month with no known injury. Initial encounter.  EXAM: RIGHT WRIST - COMPLETE 3+ VIEW  COMPARISON:  None.  FINDINGS: Bone mineralization is within normal limits. Distal radius and ulna intact. No definite radiocarpal joint space loss. Carpal  bone alignment within normal limits. Scaphoid appears intact. No carpal joint space loss identified there are advanced degenerative changes at the first MTP joint partially visualized. Visible metacarpals appear intact, possible healed fracture at the proximal fifth metacarpal.  IMPRESSION: No acute osseous abnormality identified about the right wrist. First MTP osteoarthritis.   Electronically Signed   By: Genevie Ann M.D.   On:  09/18/2014 10:08     EKG Interpretation None      MDM   Final diagnoses:  Pain  De Quervain's tenosynovitis, right   Patient presenting with history of osteoarthritis without injury to right hand with 3 weeks of pain. Neurovascularly intact. No significant swelling. X-ray with evidence of osteoarthritis to first right MCP joint. Patient with positive Finkelstein's test. Patient diagnosed with de Quervain's tendinitis and advised on NSAIDs as well as wearing a brace at night. Follow-up with orthopedics.  Discussed return precautions with patient. Discussed all results and patient verbalizes understanding and agrees with plan.  I personally performed the services described in this documentation, which was scribed in my presence. The recorded information has been reviewed and is accurate.    Al Corpus, PA-C 09/18/14 [place], DO 09/19/14 1549

## 2015-03-20 ENCOUNTER — Emergency Department (HOSPITAL_COMMUNITY): Payer: No Typology Code available for payment source

## 2015-03-20 ENCOUNTER — Emergency Department (HOSPITAL_COMMUNITY)
Admission: EM | Admit: 2015-03-20 | Discharge: 2015-03-20 | Disposition: A | Discharge status: Home / Self Care | Payer: No Typology Code available for payment source | Attending: Emergency Medicine | Admitting: Emergency Medicine

## 2015-03-20 ENCOUNTER — Encounter (HOSPITAL_COMMUNITY): Payer: Self-pay | Admitting: *Deleted

## 2015-03-20 DIAGNOSIS — M1990 Unspecified osteoarthritis, unspecified site: Secondary | ICD-10-CM | POA: Diagnosis not present

## 2015-03-20 DIAGNOSIS — Z792 Long term (current) use of antibiotics: Secondary | ICD-10-CM | POA: Diagnosis not present

## 2015-03-20 DIAGNOSIS — F17210 Nicotine dependence, cigarettes, uncomplicated: Secondary | ICD-10-CM | POA: Diagnosis not present

## 2015-03-20 DIAGNOSIS — Z9889 Other specified postprocedural states: Secondary | ICD-10-CM | POA: Diagnosis not present

## 2015-03-20 DIAGNOSIS — I10 Essential (primary) hypertension: Secondary | ICD-10-CM | POA: Diagnosis not present

## 2015-03-20 DIAGNOSIS — S161XXA Strain of muscle, fascia and tendon at neck level, initial encounter: Secondary | ICD-10-CM | POA: Diagnosis not present

## 2015-03-20 DIAGNOSIS — Y9389 Activity, other specified: Secondary | ICD-10-CM | POA: Diagnosis not present

## 2015-03-20 DIAGNOSIS — Y998 Other external cause status: Secondary | ICD-10-CM | POA: Diagnosis not present

## 2015-03-20 DIAGNOSIS — S199XXA Unspecified injury of neck, initial encounter: Secondary | ICD-10-CM | POA: Diagnosis present

## 2015-03-20 DIAGNOSIS — Y92410 Unspecified street and highway as the place of occurrence of the external cause: Secondary | ICD-10-CM | POA: Insufficient documentation

## 2015-03-20 NOTE — Discharge Instructions (Signed)
Cervical Sprain A cervical sprain is when the tissues (ligaments) that hold the neck bones in place stretch or tear. HOME CARE   Put ice on the injured area.  Put ice in a plastic bag.  Place a towel between your skin and the bag.  Leave the ice on for 15-20 minutes, 3-4 times a day.  You may have been given a collar to wear. This collar keeps your neck from moving while you heal.  Do not take the collar off unless told by your doctor.  If you have long hair, keep it outside of the collar.  Ask your doctor before changing the position of your collar. You may need to change its position over time to make it more comfortable.  If you are allowed to take off the collar for cleaning or bathing, follow your doctor's instructions on how to do it safely.  Keep your collar clean by wiping it with mild soap and water. Dry it completely. If the collar has removable pads, remove them every 1-2 days to hand wash them with soap and water. Allow them to air dry. They should be dry before you wear them in the collar.  Do not drive while wearing the collar.  Only take medicine as told by your doctor.  Keep all doctor visits as told.  Keep all physical therapy visits as told.  Adjust your work station so that you have good posture while you work.  Avoid positions and activities that make your problems worse.  Warm up and stretch before being active. GET HELP IF:  Your pain is not controlled with medicine.  You cannot take less pain medicine over time as planned.  Your activity level does not improve as expected. GET HELP RIGHT AWAY IF:   You are bleeding.  Your stomach is upset.  You have an allergic reaction to your medicine.  You develop new problems that you cannot explain.  You lose feeling (become numb) or you cannot move any part of your body (paralysis).  You have tingling or weakness in any part of your body.  Your symptoms get worse. Symptoms include:  Pain,  soreness, stiffness, puffiness (swelling), or a burning feeling in your neck.  Pain when your neck is touched.  Shoulder or upper back pain.  Limited ability to move your neck.  Headache.  Dizziness.  Your hands or arms feel week, lose feeling, or tingle.  Muscle spasms.  Difficulty swallowing or chewing. MAKE SURE YOU:   Understand these instructions.  Will watch your condition.  Will get help right away if you are not doing well or get worse.   This information is not intended to replace advice given to you by your health care provider. Make sure you discuss any questions you have with your health care provider.   Document Released: 09/15/2007 Document Revised: 11/29/2012 Document Reviewed: 10/04/2012 Elsevier Interactive Patient Education [employer]. The x-ray of your neck shows that all your hardware is intact and there's been no new subluxation or fracture.  You can continue taking ibuprofen for discomfort.  He can apply warm compresses as needed and follow-up with your primary care doctor

## 2015-03-20 NOTE — ED Notes (Signed)
C-Collar placed on pt
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
## 2016-01-24 ENCOUNTER — Emergency Department (HOSPITAL_COMMUNITY)
Admission: EM | Admit: 2016-01-24 | Discharge: 2016-01-24 | Disposition: A | Discharge status: Home / Self Care | Payer: Medicare Other | Attending: Emergency Medicine | Admitting: Emergency Medicine

## 2016-01-24 ENCOUNTER — Encounter (HOSPITAL_COMMUNITY): Payer: Self-pay | Admitting: *Deleted

## 2016-01-24 DIAGNOSIS — I10 Essential (primary) hypertension: Secondary | ICD-10-CM | POA: Insufficient documentation

## 2016-01-24 DIAGNOSIS — S8991XA Unspecified injury of right lower leg, initial encounter: Secondary | ICD-10-CM | POA: Diagnosis present

## 2016-01-24 DIAGNOSIS — S8011XA Contusion of right lower leg, initial encounter: Secondary | ICD-10-CM | POA: Insufficient documentation

## 2016-01-24 DIAGNOSIS — Y929 Unspecified place or not applicable: Secondary | ICD-10-CM | POA: Diagnosis not present

## 2016-01-24 DIAGNOSIS — F17210 Nicotine dependence, cigarettes, uncomplicated: Secondary | ICD-10-CM | POA: Insufficient documentation

## 2016-01-24 DIAGNOSIS — W2203XA Walked into furniture, initial encounter: Secondary | ICD-10-CM | POA: Diagnosis not present

## 2016-01-24 DIAGNOSIS — M79604 Pain in right leg: Secondary | ICD-10-CM

## 2016-01-24 DIAGNOSIS — Y999 Unspecified external cause status: Secondary | ICD-10-CM | POA: Diagnosis not present

## 2016-01-24 DIAGNOSIS — Y939 Activity, unspecified: Secondary | ICD-10-CM | POA: Insufficient documentation

## 2016-01-24 LAB — D-DIMER, QUANTITATIVE: D-Dimer, Quant: <0.27 ug/mL-FEU (ref 0.00–0.50)

## 2016-01-24 MED ORDER — ACETAMINOPHEN 500 MG PO TABS
500.0000 mg | ORAL_TABLET | Freq: Once | ORAL | Status: AC
  Administered 2016-01-24: 500 mg via ORAL
  Filled 2016-01-24: qty 1

## 2016-01-24 NOTE — ED Provider Notes (Addendum)
Complains of pain at right calf after he fell asleep with his knee flexed and resting on a block of wood one week ago. Pain is worse with lying still improved with walking. He also feels numbness on the plantar surface of his right foot for a week. On exam patient is in no distress left lower extremity is not swollen red or tender. DP pulses 2+. Bi laterally Sensation is intact to light touch diffusely. Good capillary refill   Calvin Dakin, MD 01/24/16 [place], MD 01/24/16 1240

## 2016-01-24 NOTE — ED Notes (Signed)
Patient states he ran out of his oxycodone about 2 days again that he takes for his arthritis. Pt is requesting a refill until the first of next month.

## 2016-01-24 NOTE — ED Triage Notes (Signed)
Patient presents from home where he lives alone, with c/o of right posterior calf pain and swelling x2 weeks.  Patient states this started after he sat in a chair with his right leg under him for an hour or so 2 weeks ago. Patient states his leg was against a hard board on chair.  On exam, patient has questionable right calf swelling with no redness or warmth.  Patient denies SOB and long distance travel.  Patient also stated he has run out of his 5 mg Percocet.  He takes that for degenerative disc disease and it was originally prescribed by [place] in [place].  Patient still sees [place] doctors, but doesn't have an upcoming appt.  He states his next rx is due at the first of next month.

## 2016-01-24 NOTE — Discharge Instructions (Signed)
The lab results have been normal. [facility] You make take tylenol and ibuprofen for pain. Use heat and ice. Please return to the ED if symptoms worsen.

## 2016-02-02 NOTE — ED Provider Notes (Signed)
[facility] Provider Note   CSN: [id number] Arrival date & time: 01/24/16  1026     History   Chief Complaint Chief Complaint  Patient presents with  . Leg Pain    HPI Calvin Mckee is a 65 y.o. male.  HPI 65 yo AA male with pmh sig for htn and arthritis presents from home where he lives alone, with c/o of right posterior calf pain and swelling x2 weeks.  Patient states this started after he sat in a chair with his right leg under him for an hour or so 2 weeks ago. Patient states his leg was against a hard board on chair. Patietn complains of right calf tenderness and swelling. Denies any erythema or warmth. Keeping his leg still makes the pain worse. Movement makes the pain better. He also reports numbness on the plantar surface of his right foot for a week. Patient denies any cp, SOB and long distance travel. Denies any fever, chills, ha, vision changes, abd pain, nauseas, emesis, urinary symptoms, change in bowel habits.  Patient also stated he has run out of his 5 mg Percocet.  He takes that for degenerative disc disease and it was originally prescribed by [place] in [place].  Patient still sees [place] doctors, but doesn't have an upcoming appt.  He states his next rx is due at the first of next month. HE would like a refill of his medication.  Past Medical History:  Diagnosis Date  . Arthritis   . Hypertension     There are no active problems to display for this patient.   Past Surgical History:  Procedure Laterality Date  . CERVICAL FUSION         Home Medications    Prior to Admission medications   Medication Sig Start Date End Date Taking? Authorizing Provider  cholecalciferol (VITAMIN D) 1000 units tablet Take 1,000 Units by mouth 2 (two) times daily.   Yes Historical Provider, MD  Multiple Vitamin (MULTIVITAMIN WITH MINERALS) TABS tablet Take 1 tablet by mouth daily.   Yes Historical Provider, MD    Family History No family history on  file.  Social History Social History  Substance Use Topics  . Smoking status: Current Every Day Smoker    Packs/day: 0.50    Types: Cigarettes  . Smokeless tobacco: Never Used  . Alcohol use No     Allergies   Review of patient's allergies indicates no known allergies.   Review of Systems Review of Systems  Constitutional: Negative for chills and fever.  HENT: Negative for congestion, ear pain, rhinorrhea and sore throat.   Eyes: Negative for pain and discharge.  Respiratory: Negative for cough and shortness of breath.   Cardiovascular: Positive for leg swelling. Negative for chest pain and palpitations.  Gastrointestinal: Negative for abdominal pain, diarrhea, nausea and vomiting.  Genitourinary: Negative for flank pain, frequency, hematuria and urgency.  Musculoskeletal: Positive for arthralgias and myalgias. Negative for neck pain.  Skin: Positive for color change.  Neurological: Negative for dizziness, syncope, weakness, light-headedness, numbness and headaches.  All other systems reviewed and are negative.    Physical Exam Updated Vital Signs BP 140/100 (BP Location: Left Arm)   Pulse 89   Temp 97.8 F (36.6 C)   Resp 17   Ht 5\' 8"  (1.727 m)   Wt 80.7 kg   SpO2 97%   BMI 27.06 kg/m   Physical Exam  Constitutional: He appears well-developed and well-nourished. No distress.  HENT:  Head: Normocephalic  and atraumatic.  Mouth/Throat: Oropharynx is clear and moist.  Eyes: Conjunctivae are normal. Right eye exhibits no discharge. Left eye exhibits no discharge. No scleral icterus.  Neck: Normal range of motion. Neck supple. No thyromegaly present.  Cardiovascular: Normal rate, regular rhythm, normal heart sounds and intact distal pulses.  Exam reveals no gallop and no friction rub.   No murmur heard. Pulmonary/Chest: Effort normal and breath sounds normal. No respiratory distress. He exhibits no tenderness.  Abdominal: Soft. Bowel sounds are normal. He exhibits  no distension. There is no tenderness. There is no rebound.  Musculoskeletal: Normal range of motion. He exhibits no edema or deformity.  Right calf with mild echymosis, no calf tenderness, no edema, no erythema. No signs of trauma or deformity. Full ROM. Cap refill normal. Sensation intact. Dp pulses are 2+ bilat.   Lymphadenopathy:    He has no cervical adenopathy.  Neurological: He is alert.  Sensation intact to light tough. Good cap refill.  Skin: Skin is warm and dry. Capillary refill takes less than 2 seconds.  Vitals reviewed.    ED Treatments / Results  Labs (all labs ordered are listed, but only abnormal results are displayed) Labs Reviewed  D-DIMER, QUANTITATIVE (NOT AT [facility])    EKG  EKG Interpretation None       Radiology No results found.  Procedures Procedures (including critical care time)  Medications Ordered in ED Medications  acetaminophen (TYLENOL) tablet 500 mg (500 mg Oral Given 01/24/16 1400)     Initial Impression / Assessment and Plan / ED Course  I have reviewed the triage vital signs and the nursing notes.  Pertinent labs & imaging results that were available during my care of the patient were reviewed by me and considered in my medical decision making (see chart for details).  Clinical Course  Patient presents to the ED with complaint of right leg pain and swelling. D dimer was negative. Low suspicion for DVT. No cp or sob. Right lower extremity with mild bruising no edema appreciated. No signs of cellulitis and with low suspicion. Patient given conservative symptomatic treatment for home. Instructed to ice and elevated leg. Discussed with patient that I would not refill his pain mediation and would need to follow up with the [facility] for refill. Patient was seen and evaluated by Dr. Winfred Leeds who is agreeable to the plan. He will be discharged home in NAD with stable vs. Hemodynamically stable and non toxic appearing. Strict return precautions given.    Final Clinical Impressions(s) / ED Diagnoses   Final diagnoses:  Right leg pain    New Prescriptions Discharge Medication List as of 01/24/2016  2:27 PM       Doristine Devoid, PA-C 02/02/16 [place], MD 02/02/16 2322

## 2016-04-16 ENCOUNTER — Ambulatory Visit (INDEPENDENT_AMBULATORY_CARE_PROVIDER_SITE_OTHER): Payer: Medicare Other | Admitting: Family

## 2016-04-16 ENCOUNTER — Encounter (INDEPENDENT_AMBULATORY_CARE_PROVIDER_SITE_OTHER): Payer: Self-pay | Admitting: Family

## 2016-04-16 DIAGNOSIS — G5761 Lesion of plantar nerve, right lower limb: Secondary | ICD-10-CM

## 2016-04-16 MED ORDER — METHYLPREDNISOLONE ACETATE 40 MG/ML IJ SUSP
40.0000 mg | INTRAMUSCULAR | Status: AC | PRN
  Administered 2016-04-16: 40 mg via INTRA_ARTICULAR

## 2016-04-16 MED ORDER — LIDOCAINE HCL 1 % IJ SOLN
1.0000 mL | INTRAMUSCULAR | Status: AC | PRN
  Administered 2016-04-16: 1 mL

## 2016-04-16 NOTE — Progress Notes (Signed)
Office Visit Note   Patient: Calvin Mckee           Date of Birth: [birth year]           MRN: [id number] Visit Date: 04/16/2016              Requested by: No referring provider defined for this encounter. PCP: Pcp Not In System   Assessment & Plan: Visit Diagnoses:  1. Morton neuroma, right     Plan: Injection today. He'll follow up in 4 weeks if continued pain. Recommended against tight toe box shoes such as the air Jordan's that he was wearing at the onset of this pain.  Follow-Up Instructions: Return in about 4 weeks (around 05/14/2016), or if symptoms worsen or fail to improve.   Orders:  Orders Placed This Encounter  Procedures  . Small Joint Injection/Arthrocentesis  . Small Joint Injection/Arthrocentesis   No orders of the defined types were placed in this encounter.     Procedures: Small Joint Inj Date/Time: 04/16/2016 1:26 PM Performed by: Suzan Slick Authorized by: Dondra Prader R   Consent Given by:  Patient Site marked: the procedure site was marked   Timeout: prior to procedure the correct patient, procedure, and site was verified   Indications:  Pain and diagnostic evaluation Location:  Foot (Third webspace) Prep: patient was prepped and draped in usual sterile fashion   Needle Size:  22 G Spinal Needle: No   Approach:  Dorsal Ultrasound Guided: No   Fluoroscopic Guidance: No   Medications:  1 mL lidocaine 1 %; 40 mg methylPREDNISolone acetate 40 MG/ML Aspiration Attempted: No   Patient tolerance:  Patient tolerated the procedure well with no immediate complications     Clinical Data: No additional findings.   Subjective: Chief Complaint  Patient presents with  . Right Foot - Numbness  . Right Leg - Pain    8 weeks of posterior right calf soreness with right foot numbness. No injury, patient fell asleep in a chair for an hour with legs crossed and woke up with right leg pain.  Patient stated he did go to the ER where he was told he had  good circulation in right leg and foot. Patient is taking tylenol, hydrocodone and using cream for pain.   Calvin Mckee, RT  The patient is a 66 year old gentleman seen today for initial evaluation of the right lower extremity. Today is complaining of numbness and burning pain over the dorsum of the foot. Relates that about 8 weeks ago happened to be wearing some tight Air Jordans. Happened to fall asleep in a chair in the waiting room him with a metal bar up against his popliteal fossa. Feels that since then him his symptoms have been present. Initially had some bruising and calf pain which has resolved now. No difficulty with ambulation. No aggravating factors. States that he does use an ice bag that he baths which provided relief for a little while. Is currently taking Tylenol and hydrocodone which provide minimal relief.    Review of Systems  Constitutional: Negative for chills and fever.     Objective: Vital Signs: There were no vitals taken for this visit.  Physical Exam  Constitutional: He is oriented to person, place, and time. He appears well-developed and well-nourished.  Pulmonary/Chest: Effort normal.  Musculoskeletal:  Tender to palpation of the third webspace right foot. Palpation reproduces symptoms across the dorsum of the foot. No pain with lateral compression of metatarsal  heads however. Flexor and extensor mechanism intact to the foot. Does have a palpable dorsalis pedis pulse.   Ankle ligaments are nontender anterior drawer is stable. Calf is nontender to palpation.   Neurological: He is alert and oriented to person, place, and time.  Psychiatric: He has a normal mood and affect.  Nursing note reviewed.   Ortho Exam  Specialty Comments:  No specialty comments available.  Imaging: No results found.   PMFS History: There are no active problems to display for this patient.  Past Medical History:  Diagnosis Date  . Arthritis   . Hypertension     No  family history on file.  Past Surgical History:  Procedure Laterality Date  . CERVICAL FUSION     Social History   Occupational History  . Not on file.   Social History Main Topics  . Smoking status: Current Every Day Smoker    Packs/day: 0.50    Types: Cigarettes  . Smokeless tobacco: Never Used  . Alcohol use No  . Drug use: No  . Sexual activity: Not on file

## 2016-04-22 ENCOUNTER — Ambulatory Visit: Payer: Medicare Other | Admitting: Podiatry

## 2016-05-06 ENCOUNTER — Ambulatory Visit (HOSPITAL_COMMUNITY)
Admission: RE | Admit: 2016-05-06 | Discharge: 2016-05-06 | Disposition: A | Discharge status: Home / Self Care | Payer: Medicare Other | Attending: Psychiatry | Admitting: Psychiatry

## 2016-05-06 ENCOUNTER — Encounter (HOSPITAL_COMMUNITY): Payer: Self-pay | Admitting: Emergency Medicine

## 2016-05-06 DIAGNOSIS — F1110 Opioid abuse, uncomplicated: Secondary | ICD-10-CM | POA: Diagnosis not present

## 2016-05-06 DIAGNOSIS — F1490 Cocaine use, unspecified, uncomplicated: Secondary | ICD-10-CM | POA: Diagnosis not present

## 2016-05-06 DIAGNOSIS — F329 Major depressive disorder, single episode, unspecified: Secondary | ICD-10-CM | POA: Diagnosis not present

## 2016-05-06 DIAGNOSIS — F1010 Alcohol abuse, uncomplicated: Secondary | ICD-10-CM | POA: Diagnosis not present

## 2016-05-06 DIAGNOSIS — F17210 Nicotine dependence, cigarettes, uncomplicated: Secondary | ICD-10-CM | POA: Insufficient documentation

## 2016-05-06 NOTE — BH Assessment (Addendum)
Tele Assessment Note  Pt presents voluntarily to [facility] for assessment. He is cooperative and oriented x 4. Pt is drowsy at start of assessment. Pt denies SI currently or at any time in the past. Pt denies any history of suicide attempts, or of self-mutilation. Pt denies homicidal thoughts or physical aggression. Pt denies having access to firearms. Pt denies having any legal problems at this time. Pt is calm and cooperative during assessment. Pt denies hallucinations. Pt does not appear to be responding to internal stimuli and exhibits no delusional thought. Pt's reality testing appears to be intact.   Pt does not appear to be intoxicated or in withdrawal at this time. He reports he had been clean and sober for 5 yrs until he relapsed two mos ago. Pt sts for past two mos he has used crack cocaine, percocet, and etoh daily. Pt sts he uses approx 30 mg percocet daily and approx $100 crack daily. Pt sts he last used all three substances last night. He reports he has been inpatient for detox as [facility] and also stayed at [facility] he was in [place] for 4 yrs. Pt reports strong social support system. He sts he is prescribed Zoloft by MD at [facility] for depression. He endorses insomnia (hasn't slept in 2 days), weight loss, anhedonia, isolating and fatigue. He says, "I got to get myself together." Pt also says, "I'm burnout right now." Pt denies any medical issues. He says his R foot is sore because his sneakers are a little tight. He says he is prescribed percocet but he also buys the drug off the street.   COLSON MARCILLE is an 66 y.o. male.   Diagnosis: Alcohol Abuse Disorder, Severe Cocaine Use Disorder, Severe Opioid Use Disorder, Severe Unspecified Depressive Disorder  Past Medical History:  Past Medical History:  Diagnosis Date  . Arthritis   . Hypertension     Past Surgical History:  Procedure Laterality Date  . CERVICAL FUSION      Family History: No family  history on file.  Social History:  reports that he has been smoking Cigarettes.  He has been smoking about 0.50 packs per day. He has never used smokeless tobacco. He reports that he drinks alcohol. He reports that he uses drugs, including "Crack" cocaine and Other-see comments.  Additional Social History:  Alcohol / Drug Use Pain Medications: pt reports abusing percocets for past 2 mos Prescriptions: pt reports abusing percocets for past 2 mos Over the Counter: none History of alcohol / drug use?: No history of alcohol / drug abuse Longest period of sobriety (when/how long): 5 yrs (until  2 mos)  Negative Consequences of Use: Financial, Personal relationships Substance #1 Name of Substance 1: crack cocaine 1 - Duration: daily for past 2 mos 1 - Last Use / Amount: 05/05/16 - $100 worth Substance #2 Name of Substance 2: etoh 2 - Age of First Use: 12 2 - Amount (size/oz): varies 2 - Frequency: daily 2 - Duration: for past 2 mos 2 - Last Use / Amount: 05/05/16 - margaritas Substance #3 Name of Substance 3: opioids - percocet - swallows them 3 - Age of First Use: unknown  3 - Amount (size/oz): 30 mg 3 - Frequency: daily 3 - Duration: for past 2 mos 3 - Last Use / Amount: 05/05/16 - 30 mg  CIWA:   COWS:    PATIENT STRENGTHS: (choose at least two) Average or above average intelligence Capable of independent living Communication  skills Physical Health Supportive family/friends  Allergies: No Known Allergies  Home Medications:  (Not in a hospital admission)  OB/GYN Status:  No LMP for male patient.  General Assessment Data Location of Assessment: [facility] Assessment Services TTS Assessment: In system Is this a Tele or Face-to-Face Assessment?: Face-to-Face Is this an Initial Assessment or a Re-assessment for this encounter?: Initial Assessment Marital status: Single Maiden name: nonw Is patient pregnant?: No Pregnancy Status: No Living Arrangements: Alone Can pt return to  current living arrangement?: Yes Admission Status: Voluntary Is patient capable of signing voluntary admission?: Yes Referral Source: Self/Family/Friend Insurance type: medicare  Medical Screening Exam ([place]) Medical Exam completed: No Reason for MSE not completed: Patient Refused  Crisis Care Plan Living Arrangements: Alone Name of Psychiatrist: [facility] Name of Therapist: none  Education Status Is patient currently in school?: No Highest grade of school patient has completed: 12  Risk to self with the past 6 months Suicidal Ideation: No Has patient been a risk to self within the past 6 months prior to admission? : No Suicidal Intent: No Has patient had any suicidal intent within the past 6 months prior to admission? : No Is patient at risk for suicide?: No Suicidal Plan?: No Has patient had any suicidal plan within the past 6 months prior to admission? : No Access to Means: No What has been your use of drugs/alcohol within the last 12 months?: relapsed two mos ago - daily use etoh, crack cocaine & percocet Previous Attempts/Gestures: No How many times?: 0 Other Self Harm Risks: none Intentional Self Injurious Behavior: None Family Suicide History: No Recent stressful life event(s): Other (Comment) (relapse after 5 yrs clean) Persecutory voices/beliefs?: No Depression: Yes Depression Symptoms: Isolating, Fatigue, Loss of interest in usual pleasures Substance abuse history and/or treatment for substance abuse?: Yes Suicide prevention information given to non-admitted patients: Not applicable  Risk to Others within the past 6 months Homicidal Ideation: No Does patient have any lifetime risk of violence toward others beyond the six months prior to admission? : No Thoughts of Harm to Others: No Current Homicidal Intent: No Current Homicidal Plan: No Access to Homicidal Means: No Identified Victim: none History of harm to others?: No Assessment of  Violence: None Noted Violent Behavior Description: pt denies hx violence Does patient have access to weapons?: No Criminal Charges Pending?: No Does patient have a court date: No Is patient on probation?: No  Psychosis Hallucinations: None noted Delusions: None noted  Mental Status Report Appearance/Hygiene: Unremarkable (in appropriate street clothes) Eye Contact: Fair Motor Activity: Freedom of movement Speech: Logical/coherent Level of Consciousness: Drowsy Mood: Depressed, Sad, Anhedonia Anxiety Level: None Thought Processes: Coherent, Relevant Judgement: Unimpaired Orientation: Place, Person, Situation, Time Obsessive Compulsive Thoughts/Behaviors: None  Cognitive Functioning Concentration: Normal Memory: Recent Intact, Remote Intact IQ: Average Insight: Good Impulse Control: Poor Appetite: Good Weight Loss:  (yes, unknown amt of weight lost) Sleep: Decreased Total Hours of Sleep: 0 (pt sts hasn't slept in 2 sYA) Vegetative Symptoms: None  ADLScreening [facility] Assessment Services) Patient's cognitive ability adequate to safely complete daily activities?: Yes Patient able to express need for assistance with ADLs?: Yes Independently performs ADLs?: Yes (appropriate for developmental age)  Prior Inpatient Therapy Prior Inpatient Therapy: Yes Prior Therapy Dates: pt doesn't remember dates Prior Therapy Facilty/Provider(s): [facility], Lowanda Foster  Reason for Treatment: substance abuse, depression  Prior Outpatient Therapy Prior Outpatient Therapy: Yes Prior Therapy Dates: currently Prior Therapy Facilty/Provider(s): [facility] [place] Reason for Treatment: med management  Does patient have an ACCT team?: No Does patient have Intensive In-House Services?  : No Does patient have [facility] services? : No Does patient have P4CC services?: No  ADL Screening (condition at time of admission) Patient's cognitive ability adequate to safely complete daily activities?:  Yes Is the patient deaf or have difficulty hearing?: No Does the patient have difficulty seeing, even when wearing glasses/contacts?: No Does the patient have difficulty concentrating, remembering, or making decisions?: No Patient able to express need for assistance with ADLs?: Yes Does the patient have difficulty dressing or bathing?: No Independently performs ADLs?: Yes (appropriate for developmental age) Does the patient have difficulty walking or climbing stairs?: No Weakness of Legs: None Weakness of Arms/Hands: None  Home Assistive Devices/Equipment Home Assistive Devices/Equipment: Dentures (specify type)    Abuse/Neglect Assessment (Assessment to be complete while patient is alone) Physical Abuse: Denies Verbal Abuse: Denies Sexual Abuse: Denies Exploitation of patient/patient's resources: Denies Self-Neglect: Denies     Regulatory affairs officer (For Healthcare) Does Patient Have a Medical Advance Directive?: No Would patient like information on creating a medical advance directive?: No - Patient declined    Additional Information 1:1 In Past 12 Months?: No CIRT Risk: No Elopement Risk: No Does patient have medical clearance?: No     Disposition:  Disposition Initial Assessment Completed for this Encounter: Yes Disposition of Patient: Outpatient treatment   At first, pt expresses interest in detox. Writer explains that pt doesn't meet [facility] criteria for inpatient treatment. Pt expresses desire to go to his car, so he could smoke. Writer explains that pt can't leave the building until assessment is completed. Pt gains access to lobby and Probation officer asks pt to return to assessment room. Pt refuses and sts he has to go smoke now and says that he "will come back later." He also refuses to sign MSE decline form. Pt leaves the building. At time of assessment, the [facility] provider wasn't available yet. 25 Writer called and left voicemail asking pt to call writer back.   Antinette Keough  P 05/06/2016 8:57 AM

## 2016-05-12 ENCOUNTER — Encounter (HOSPITAL_COMMUNITY): Payer: Self-pay

## 2016-05-12 ENCOUNTER — Emergency Department (HOSPITAL_COMMUNITY)
Admission: EM | Admit: 2016-05-12 | Discharge: 2016-05-12 | Disposition: A | Discharge status: Home / Self Care | Payer: Medicare Other | Attending: Physician Assistant | Admitting: Physician Assistant

## 2016-05-12 ENCOUNTER — Emergency Department (HOSPITAL_COMMUNITY): Payer: Medicare Other

## 2016-05-12 DIAGNOSIS — F17210 Nicotine dependence, cigarettes, uncomplicated: Secondary | ICD-10-CM | POA: Insufficient documentation

## 2016-05-12 DIAGNOSIS — I10 Essential (primary) hypertension: Secondary | ICD-10-CM | POA: Diagnosis not present

## 2016-05-12 DIAGNOSIS — Z79899 Other long term (current) drug therapy: Secondary | ICD-10-CM | POA: Diagnosis not present

## 2016-05-12 DIAGNOSIS — L03032 Cellulitis of left toe: Secondary | ICD-10-CM | POA: Diagnosis not present

## 2016-05-12 DIAGNOSIS — R200 Anesthesia of skin: Secondary | ICD-10-CM | POA: Diagnosis not present

## 2016-05-12 LAB — BASIC METABOLIC PANEL
Anion gap: 15 (ref 5–15)
CALCIUM: 9.2 mg/dL (ref 8.9–10.3)
CO2: 22 mmol/L (ref 22–32)
CREATININE: 0.75 mg/dL (ref 0.61–1.24)
Chloride: 97 mmol/L — ABNORMAL LOW (ref 101–111)
GFR calc Af Amer: >60 mL/min (ref >60)
GLUCOSE: 89 mg/dL (ref 65–99)
Potassium: 3.7 mmol/L (ref 3.5–5.1)
Sodium: 134 mmol/L — ABNORMAL LOW (ref 135–145)

## 2016-05-12 LAB — CBC WITH DIFFERENTIAL/PLATELET
Basophils Absolute: 0 10*3/uL (ref 0.0–0.1)
Basophils Relative: 1 %
EOS PCT: 3 %
Eosinophils Absolute: 0.2 10*3/uL (ref 0.0–0.7)
HCT: 41.2 % (ref 39.0–52.0)
Hemoglobin: 14.4 g/dL (ref 13.0–17.0)
LYMPHS PCT: 44 %
Lymphs Abs: 2.7 10*3/uL (ref 0.7–4.0)
MCH: 30.8 pg (ref 26.0–34.0)
MCHC: 35 g/dL (ref 30.0–36.0)
MCV: 88 fL (ref 78.0–100.0)
MONO ABS: 0.5 10*3/uL (ref 0.1–1.0)
Monocytes Relative: 8 %
Neutro Abs: 2.7 10*3/uL (ref 1.7–7.7)
Neutrophils Relative %: 44 %
PLATELETS: 313 10*3/uL (ref 150–400)
RBC: 4.68 MIL/uL (ref 4.22–5.81)
RDW: 14.7 % (ref 11.5–15.5)
WBC: 6.1 10*3/uL (ref 4.0–10.5)

## 2016-05-12 LAB — CBG MONITORING, ED: Glucose-Capillary: 114 mg/dL — ABNORMAL HIGH (ref 65–99)

## 2016-05-12 MED ORDER — ACETAMINOPHEN 500 MG PO TABS
1000.0000 mg | ORAL_TABLET | Freq: Once | ORAL | Status: AC
  Administered 2016-05-12: 1000 mg via ORAL
  Filled 2016-05-12: qty 2

## 2016-05-12 MED ORDER — CEPHALEXIN 500 MG PO CAPS: 500.0000 mg | ORAL_CAPSULE | Freq: Four times a day (QID) | ORAL | 0 refills | Status: DC

## 2016-05-12 NOTE — Discharge Instructions (Signed)
Is important to take your medications as prescribed. Keep your wound clean and dry. Please follow-up with your orthopedist tomorrow for a wound recheck. You may take Tylenol for pain. Return to ED for new or worsening symptoms as we discussed.

## 2016-05-12 NOTE — ED Triage Notes (Signed)
Pt complaining of numbness to R foot x 6 months. Pt with wound to medial side of R 2nd toe. Pt with redness and swelling to entire R 2nd toe. Pt afebrile at [place].

## 2016-05-12 NOTE — ED Provider Notes (Signed)
[facility] DEPT Provider Note   CSN: [id number] Arrival date & time: 05/12/16  1626     History   Chief Complaint Chief Complaint  Patient presents with  . Toe Injury    HPI Calvin Mckee is a 66 y.o. male.  HPI history arthritis and hypertension here for evaluation of right foot second toe injury. Patient reports he has had numbness in his toe for the past 6 months, but noticed on Saturday he developed a sore on the inside of his toe that has gradually gotten worse. He has a follow-up plan with the orthopedist tomorrow. He does report redness to the toe He denies injury, fevers, chills, sweats or nocturnal symptoms  Past Medical History:  Diagnosis Date  . Arthritis   . Hypertension     There are no active problems to display for this patient.   Past Surgical History:  Procedure Laterality Date  . CERVICAL FUSION         Home Medications    Prior to Admission medications   Medication Sig Start Date End Date Taking? Authorizing Provider  cephALEXin (KEFLEX) 500 MG capsule Take 1 capsule (500 mg total) by mouth 4 (four) times daily. 05/12/16   Comer Locket, PA-C  cholecalciferol (VITAMIN D) 1000 units tablet Take 1,000 Units by mouth 2 (two) times daily.    Historical Provider, MD  Multiple Vitamin (MULTIVITAMIN WITH MINERALS) TABS tablet Take 1 tablet by mouth daily.    Historical Provider, MD    Family History History reviewed. No pertinent family history.  Social History Social History  Substance Use Topics  . Smoking status: Current Every Day Smoker    Packs/day: 0.50    Types: Cigarettes  . Smokeless tobacco: Never Used  . Alcohol use Yes     Allergies   Patient has no known allergies.   Review of Systems Review of Systems A 10 point review of systems was completed and was negative except for pertinent positives and negatives as mentioned in the history of present illness   Physical Exam Updated Vital Signs BP 143/76   Pulse 91    Temp 98.5 F (36.9 C) (Oral)   Resp 16   SpO2 98%   Physical Exam  Constitutional: He appears well-developed. No distress.  Awake, alert and nontoxic in appearance  HENT:  Head: Normocephalic and atraumatic.  Right Ear: External ear normal.  Left Ear: External ear normal.  Mouth/Throat: Oropharynx is clear and moist.  Eyes: Conjunctivae and EOM are normal. Pupils are equal, round, and reactive to light.  Neck: Normal range of motion. No JVD present.  Cardiovascular: Normal rate, regular rhythm and normal heart sounds.   Pulmonary/Chest: Effort normal and breath sounds normal. No stridor.  Abdominal: Soft. There is no tenderness.  Musculoskeletal: Normal range of motion.  Neurological:  Awake, alert, cooperative and aware of situation; motor strength bilaterally; sensation normal to light touch bilaterally; no facial asymmetry; tongue midline; major cranial nerves appear intact;  baseline gait without new ataxia.  Skin: No rash noted. He is not diaphoretic.  Right foot: Second toe redness mildly erythemic, warm. He has an open ulceration to the medial proximal phalanx  Psychiatric: He has a normal mood and affect. His behavior is normal. Thought content normal.  Nursing note and vitals reviewed.    ED Treatments / Results  Labs (all labs ordered are listed, but only abnormal results are displayed) Labs Reviewed  BASIC METABOLIC PANEL - Abnormal; Notable for the following:  Result Value   Sodium 134 (*)    Chloride 97 (*)    BUN <5 (*)    All other components within normal limits  CBC WITH DIFFERENTIAL/PLATELET    EKG  EKG Interpretation None       Radiology Dg Foot Complete Right  Result Date: 05/12/2016 CLINICAL DATA:  Right foot numbness. Soft tissue defect between the great toe and second toe. EXAM: RIGHT FOOT COMPLETE - 3+ VIEW COMPARISON:  None. FINDINGS: No evidence of fracture or dislocation. Mild hallux valgus deformity. No sign of osteomyelitis. No  radiopaque foreign object. IMPRESSION: Mild hallux valgus deformity.  No other finding. Electronically Signed   By: Nelson Chimes M.D.   On: 05/12/2016 18:57    Procedures Procedures (including critical care time)  Medications Ordered in ED Medications  acetaminophen (TYLENOL) tablet 1,000 mg (not administered)     Initial Impression / Assessment and Plan / ED Course  I have reviewed the triage vital signs and the nursing notes.  Pertinent labs & imaging results that were available during my care of the patient were reviewed by me and considered in my medical decision making (see chart for details).    CBG:114 Pt with cellulitis and ulcer on L foot toe. No evidence of osteomyelitis. Will place on empiric antibiotics, Keflex. Wound dressed and wrapped in ER. To follow-up with orthopedist tomorrow for recheck. Return precautions discussed  Final Clinical Impressions(s) / ED Diagnoses   Final diagnoses:  Cellulitis of toe of left foot    New Prescriptions New Prescriptions   CEPHALEXIN (KEFLEX) 500 MG CAPSULE    Take 1 capsule (500 mg total) by mouth 4 (four) times daily.     Comer Locket, PA-C 05/12/16 2044    [place], MD 05/12/16 [phone]

## 2016-05-21 ENCOUNTER — Ambulatory Visit (INDEPENDENT_AMBULATORY_CARE_PROVIDER_SITE_OTHER): Payer: Medicare Other | Admitting: Family

## 2016-06-18 DIAGNOSIS — L539 Erythematous condition, unspecified: Secondary | ICD-10-CM | POA: Diagnosis not present

## 2016-06-18 DIAGNOSIS — F17210 Nicotine dependence, cigarettes, uncomplicated: Secondary | ICD-10-CM | POA: Diagnosis not present

## 2016-06-18 DIAGNOSIS — M79671 Pain in right foot: Secondary | ICD-10-CM | POA: Diagnosis not present

## 2016-06-30 DIAGNOSIS — G8918 Other acute postprocedural pain: Secondary | ICD-10-CM | POA: Diagnosis not present

## 2016-06-30 DIAGNOSIS — M79674 Pain in right toe(s): Secondary | ICD-10-CM | POA: Diagnosis not present

## 2016-06-30 DIAGNOSIS — I10 Essential (primary) hypertension: Secondary | ICD-10-CM | POA: Diagnosis not present

## 2016-06-30 DIAGNOSIS — R7981 Abnormal blood-gas level: Secondary | ICD-10-CM | POA: Diagnosis not present

## 2016-06-30 DIAGNOSIS — Z89421 Acquired absence of other right toe(s): Secondary | ICD-10-CM | POA: Diagnosis not present

## 2016-06-30 DIAGNOSIS — F17200 Nicotine dependence, unspecified, uncomplicated: Secondary | ICD-10-CM | POA: Diagnosis not present

## 2016-07-06 ENCOUNTER — Encounter (INDEPENDENT_AMBULATORY_CARE_PROVIDER_SITE_OTHER): Payer: Self-pay | Admitting: Orthopaedic Surgery

## 2016-07-06 ENCOUNTER — Telehealth (INDEPENDENT_AMBULATORY_CARE_PROVIDER_SITE_OTHER): Payer: Self-pay | Admitting: Orthopaedic Surgery

## 2016-07-06 ENCOUNTER — Ambulatory Visit (INDEPENDENT_AMBULATORY_CARE_PROVIDER_SITE_OTHER): Payer: Medicare Other | Admitting: Orthopaedic Surgery

## 2016-07-06 DIAGNOSIS — Z89421 Acquired absence of other right toe(s): Secondary | ICD-10-CM | POA: Diagnosis not present

## 2016-07-06 DIAGNOSIS — IMO0002 Reserved for concepts with insufficient information to code with codable children: Secondary | ICD-10-CM

## 2016-07-06 MED ORDER — IBUPROFEN 800 MG PO TABS: 800.0000 mg | ORAL_TABLET | Freq: Three times a day (TID) | ORAL | 0 refills | Status: DC | PRN

## 2016-07-06 MED ORDER — DOXYCYCLINE HYCLATE 100 MG PO CAPS: 100.0000 mg | ORAL_CAPSULE | Freq: Two times a day (BID) | ORAL | 0 refills | Status: DC

## 2016-07-06 NOTE — Progress Notes (Signed)
   Office Visit Note   Patient: Calvin Mckee           Date of Birth: 01-04-1951           MRN: [id number] Visit Date: 07/06/2016              Requested by: No referring provider defined for this encounter. PCP: Pcp Not In System   Assessment & Plan: Visit Diagnoses:  1. Toe amputation status, right ([place])     Plan: Continue wet-to-dry dressings. Doxycycline twice a day. Counseled patient about smoking cessation. Follow-up in 2 weeks for wound check. Home health nurse ordered.  Follow-Up Instructions: Return in about 2 weeks (around 07/20/2016).   Orders:  No orders of the defined types were placed in this encounter.  Meds ordered this encounter  Medications  . doxycycline (VIBRAMYCIN) 100 MG capsule    Sig: Take 1 capsule (100 mg total) by mouth 2 (two) times daily.    Dispense:  40 capsule    Refill:  0  . ibuprofen (ADVIL,MOTRIN) 800 MG tablet    Sig: Take 1 tablet (800 mg total) by mouth every 8 (eight) hours as needed.    Dispense:  30 tablet    Refill:  0      Procedures: No procedures performed   Clinical Data: No additional findings.   Subjective: Chief Complaint  Patient presents with  . Right Foot - Pain    Right 2nd Toe Amputation, done at [facility]     Patient is a 66 year old gentleman who underwent right second toe amputation at the [facility] on March 6 comes in today for follow-up. He recently moved [place]. He denies any constitutional symptoms. He endorses pain in the right foot. He does smoke cigarettes.    Review of Systems  Constitutional: Negative.   All other systems reviewed and are negative.    Objective: Vital Signs: There were no vitals taken for this visit.  Physical Exam  Constitutional: He is oriented to person, place, and time. He appears well-developed and well-nourished.  Pulmonary/Chest: Effort normal.  Abdominal: Soft.  Neurological: He is alert and oriented to person, place, and time.  Skin: Skin is warm.    Psychiatric: He has a normal mood and affect. His behavior is normal. Judgment and thought content normal.  Nursing note and vitals reviewed.   Ortho Exam Right foot exam shows status post second toe amputation with superficial wound dehiscence. He also has a pressure ulcer on the lateral aspect of the great toe without exposure of any deep structures. Specialty Comments:  No specialty comments available.  Imaging: No results found.   PMFS History: Patient Active Problem List   Diagnosis Date Noted  . Toe amputation status, right ([place]) 07/06/2016   Past Medical History:  Diagnosis Date  . Arthritis   . Hypertension     History reviewed. No pertinent family history.  Past Surgical History:  Procedure Laterality Date  . CERVICAL FUSION     Social History   Occupational History  . Not on file.   Social History Main Topics  . Smoking status: Current Every Day Smoker    Packs/day: 0.50    Types: Cigarettes  . Smokeless tobacco: Never Used  . Alcohol use Yes  . Drug use: Yes    Types: "Crack" cocaine, Other-see comments     Comment: percocet   . Sexual activity: Not on file

## 2016-07-06 NOTE — Telephone Encounter (Signed)
Calvin Mckee called needing the last few doctors notes to be faxed over. Thank you. Fax # [phone]

## 2016-07-07 NOTE — Telephone Encounter (Signed)
Faxed note.

## 2016-07-10 ENCOUNTER — Inpatient Hospital Stay (HOSPITAL_COMMUNITY): Admit: 2016-07-10 | Payer: Medicare Other

## 2016-07-10 ENCOUNTER — Inpatient Hospital Stay (HOSPITAL_COMMUNITY)
Admission: EM | Admit: 2016-07-10 | Discharge: 2016-07-17 | DRG: 603 | Disposition: A | Discharge status: Skilled Nursing Facility | Payer: Medicare Other | Attending: Internal Medicine | Admitting: Internal Medicine

## 2016-07-10 ENCOUNTER — Encounter (HOSPITAL_COMMUNITY): Payer: Self-pay

## 2016-07-10 ENCOUNTER — Inpatient Hospital Stay (HOSPITAL_COMMUNITY): Payer: Medicare Other

## 2016-07-10 ENCOUNTER — Emergency Department (HOSPITAL_COMMUNITY): Payer: Medicare Other

## 2016-07-10 DIAGNOSIS — F1910 Other psychoactive substance abuse, uncomplicated: Secondary | ICD-10-CM | POA: Diagnosis not present

## 2016-07-10 DIAGNOSIS — Z789 Other specified health status: Secondary | ICD-10-CM

## 2016-07-10 DIAGNOSIS — L0390 Cellulitis, unspecified: Secondary | ICD-10-CM | POA: Diagnosis not present

## 2016-07-10 DIAGNOSIS — I10 Essential (primary) hypertension: Secondary | ICD-10-CM | POA: Diagnosis present

## 2016-07-10 DIAGNOSIS — L03031 Cellulitis of right toe: Secondary | ICD-10-CM | POA: Diagnosis not present

## 2016-07-10 DIAGNOSIS — R000 Tachycardia, unspecified: Secondary | ICD-10-CM | POA: Diagnosis not present

## 2016-07-10 DIAGNOSIS — F1410 Cocaine abuse, uncomplicated: Secondary | ICD-10-CM | POA: Diagnosis present

## 2016-07-10 DIAGNOSIS — L97519 Non-pressure chronic ulcer of other part of right foot with unspecified severity: Secondary | ICD-10-CM | POA: Diagnosis present

## 2016-07-10 DIAGNOSIS — Z89421 Acquired absence of other right toe(s): Secondary | ICD-10-CM | POA: Diagnosis not present

## 2016-07-10 DIAGNOSIS — F17210 Nicotine dependence, cigarettes, uncomplicated: Secondary | ICD-10-CM | POA: Diagnosis present

## 2016-07-10 DIAGNOSIS — E876 Hypokalemia: Secondary | ICD-10-CM

## 2016-07-10 DIAGNOSIS — L02611 Cutaneous abscess of right foot: Secondary | ICD-10-CM | POA: Diagnosis not present

## 2016-07-10 DIAGNOSIS — L089 Local infection of the skin and subcutaneous tissue, unspecified: Secondary | ICD-10-CM | POA: Diagnosis not present

## 2016-07-10 DIAGNOSIS — F17200 Nicotine dependence, unspecified, uncomplicated: Secondary | ICD-10-CM

## 2016-07-10 DIAGNOSIS — M79671 Pain in right foot: Secondary | ICD-10-CM | POA: Diagnosis not present

## 2016-07-10 DIAGNOSIS — E871 Hypo-osmolality and hyponatremia: Secondary | ICD-10-CM | POA: Diagnosis not present

## 2016-07-10 DIAGNOSIS — R509 Fever, unspecified: Secondary | ICD-10-CM

## 2016-07-10 DIAGNOSIS — R52 Pain, unspecified: Secondary | ICD-10-CM

## 2016-07-10 DIAGNOSIS — F1090 Alcohol use, unspecified, uncomplicated: Secondary | ICD-10-CM

## 2016-07-10 DIAGNOSIS — F1123 Opioid dependence with withdrawal: Secondary | ICD-10-CM | POA: Diagnosis not present

## 2016-07-10 DIAGNOSIS — Z7289 Other problems related to lifestyle: Secondary | ICD-10-CM

## 2016-07-10 DIAGNOSIS — F4323 Adjustment disorder with mixed anxiety and depressed mood: Secondary | ICD-10-CM | POA: Diagnosis not present

## 2016-07-10 DIAGNOSIS — Z79891 Long term (current) use of opiate analgesic: Secondary | ICD-10-CM | POA: Diagnosis not present

## 2016-07-10 DIAGNOSIS — M79661 Pain in right lower leg: Secondary | ICD-10-CM | POA: Diagnosis not present

## 2016-07-10 DIAGNOSIS — Z79899 Other long term (current) drug therapy: Secondary | ICD-10-CM | POA: Diagnosis not present

## 2016-07-10 DIAGNOSIS — R6510 Systemic inflammatory response syndrome (SIRS) of non-infectious origin without acute organ dysfunction: Secondary | ICD-10-CM | POA: Diagnosis present

## 2016-07-10 DIAGNOSIS — M7989 Other specified soft tissue disorders: Secondary | ICD-10-CM | POA: Diagnosis not present

## 2016-07-10 DIAGNOSIS — J9811 Atelectasis: Secondary | ICD-10-CM | POA: Diagnosis not present

## 2016-07-10 DIAGNOSIS — M869 Osteomyelitis, unspecified: Secondary | ICD-10-CM | POA: Diagnosis not present

## 2016-07-10 LAB — CBC WITH DIFFERENTIAL/PLATELET
BASOS ABS: 0 10*3/uL (ref 0.0–0.1)
BASOS PCT: 0 %
EOS ABS: 0.1 10*3/uL (ref 0.0–0.7)
Eosinophils Relative: 1 %
HEMATOCRIT: 30.9 % — AB (ref 39.0–52.0)
Hemoglobin: 10.9 g/dL — ABNORMAL LOW (ref 13.0–17.0)
Lymphocytes Relative: 15 %
Lymphs Abs: 2 10*3/uL (ref 0.7–4.0)
MCH: 30.7 pg (ref 26.0–34.0)
MCHC: 35.3 g/dL (ref 30.0–36.0)
MCV: 87 fL (ref 78.0–100.0)
MONOS PCT: 12 %
Monocytes Absolute: 1.6 10*3/uL — ABNORMAL HIGH (ref 0.1–1.0)
NEUTROS ABS: 10 10*3/uL — AB (ref 1.7–7.7)
NEUTROS PCT: 72 %
Platelets: 417 10*3/uL — ABNORMAL HIGH (ref 150–400)
RBC: 3.55 MIL/uL — AB (ref 4.22–5.81)
RDW: 13.2 % (ref 11.5–15.5)
WBC: 13.7 10*3/uL — AB (ref 4.0–10.5)

## 2016-07-10 LAB — BASIC METABOLIC PANEL
ANION GAP: 13 (ref 5–15)
BUN: 10 mg/dL (ref 6–20)
CO2: 23 mmol/L (ref 22–32)
CREATININE: 0.69 mg/dL (ref 0.61–1.24)
Calcium: 9.3 mg/dL (ref 8.9–10.3)
Chloride: 92 mmol/L — ABNORMAL LOW (ref 101–111)
GFR calc non Af Amer: >60 mL/min (ref >60)
Glucose, Bld: 79 mg/dL (ref 65–99)
Potassium: 3 mmol/L — ABNORMAL LOW (ref 3.5–5.1)
SODIUM: 128 mmol/L — AB (ref 135–145)

## 2016-07-10 LAB — URINALYSIS, ROUTINE W REFLEX MICROSCOPIC
BILIRUBIN URINE: NEGATIVE
Glucose, UA: NEGATIVE mg/dL
KETONES UR: NEGATIVE mg/dL
LEUKOCYTES UA: NEGATIVE
NITRITE: NEGATIVE
PH: 5 (ref 5.0–8.0)
Protein, ur: NEGATIVE mg/dL
SQUAMOUS EPITHELIAL / LPF: NONE SEEN
Specific Gravity, Urine: 1.008 (ref 1.005–1.030)

## 2016-07-10 LAB — RAPID URINE DRUG SCREEN, HOSP PERFORMED
Amphetamines: NOT DETECTED
BARBITURATES: NOT DETECTED
BENZODIAZEPINES: NOT DETECTED
Cocaine: NOT DETECTED
Opiates: POSITIVE — AB
Tetrahydrocannabinol: NOT DETECTED

## 2016-07-10 LAB — SEDIMENTATION RATE: SED RATE: 95 mm/h — AB (ref 0–16)

## 2016-07-10 LAB — C-REACTIVE PROTEIN: CRP: 20.6 mg/dL — AB (ref <1.0)

## 2016-07-10 MED ORDER — VITAMIN B-1 100 MG PO TABS
100.0000 mg | ORAL_TABLET | Freq: Every day | ORAL | Status: DC
  Administered 2016-07-10 – 2016-07-17 (×8): 100 mg via ORAL
  Filled 2016-07-10 (×8): qty 1

## 2016-07-10 MED ORDER — TRAMADOL HCL 50 MG PO TABS
50.0000 mg | ORAL_TABLET | Freq: Four times a day (QID) | ORAL | Status: DC | PRN
  Administered 2016-07-11 – 2016-07-17 (×10): 50 mg via ORAL
  Filled 2016-07-10 (×10): qty 1

## 2016-07-10 MED ORDER — DEXTROSE 5 % IV SOLN
2.0000 g | INTRAVENOUS | Status: DC
  Administered 2016-07-10: 2 g via INTRAVENOUS
  Filled 2016-07-10: qty 2

## 2016-07-10 MED ORDER — HYDROMORPHONE HCL 1 MG/ML IJ SOLN
1.0000 mg | Freq: Once | INTRAMUSCULAR | Status: AC
  Administered 2016-07-10: 1 mg via INTRAVENOUS
  Filled 2016-07-10: qty 1

## 2016-07-10 MED ORDER — FOLIC ACID 1 MG PO TABS
1.0000 mg | ORAL_TABLET | Freq: Every day | ORAL | Status: DC
  Administered 2016-07-10 – 2016-07-17 (×8): 1 mg via ORAL
  Filled 2016-07-10 (×8): qty 1

## 2016-07-10 MED ORDER — SODIUM CHLORIDE 0.9 % IV SOLN
1500.0000 mg | Freq: Two times a day (BID) | INTRAVENOUS | Status: DC
  Administered 2016-07-11 – 2016-07-13 (×6): 1500 mg via INTRAVENOUS
  Filled 2016-07-10 (×8): qty 1500

## 2016-07-10 MED ORDER — NICOTINE 14 MG/24HR TD PT24
14.0000 mg | MEDICATED_PATCH | Freq: Every day | TRANSDERMAL | Status: DC
  Administered 2016-07-10 – 2016-07-15 (×6): 14 mg via TRANSDERMAL
  Filled 2016-07-10 (×6): qty 1

## 2016-07-10 MED ORDER — ONDANSETRON HCL 4 MG PO TABS: 4.0000 mg | ORAL_TABLET | Freq: Four times a day (QID) | ORAL | Status: DC | PRN

## 2016-07-10 MED ORDER — VITAMIN D3 25 MCG (1000 UNIT) PO TABS
1000.0000 [IU] | ORAL_TABLET | Freq: Two times a day (BID) | ORAL | Status: DC
  Administered 2016-07-10 – 2016-07-17 (×13): 1000 [IU] via ORAL
  Filled 2016-07-10 (×14): qty 1

## 2016-07-10 MED ORDER — LORAZEPAM 1 MG PO TABS
1.0000 mg | ORAL_TABLET | Freq: Four times a day (QID) | ORAL | Status: AC | PRN
  Administered 2016-07-11: 1 mg via ORAL
  Filled 2016-07-10: qty 1

## 2016-07-10 MED ORDER — ENOXAPARIN SODIUM 40 MG/0.4ML ~~LOC~~ SOLN
40.0000 mg | SUBCUTANEOUS | Status: DC
  Administered 2016-07-10 – 2016-07-16 (×7): 40 mg via SUBCUTANEOUS
  Filled 2016-07-10 (×7): qty 0.4

## 2016-07-10 MED ORDER — FUROSEMIDE 10 MG/ML IJ SOLN
40.0000 mg | Freq: Once | INTRAMUSCULAR | Status: AC
  Administered 2016-07-10: 40 mg via INTRAVENOUS
  Filled 2016-07-10: qty 4

## 2016-07-10 MED ORDER — POTASSIUM CHLORIDE CRYS ER 20 MEQ PO TBCR
40.0000 meq | EXTENDED_RELEASE_TABLET | Freq: Once | ORAL | Status: AC
  Administered 2016-07-10: 40 meq via ORAL
  Filled 2016-07-10: qty 2

## 2016-07-10 MED ORDER — LORAZEPAM 2 MG/ML IJ SOLN
1.0000 mg | Freq: Once | INTRAMUSCULAR | Status: AC
  Administered 2016-07-10: 1 mg via INTRAVENOUS
  Filled 2016-07-10: qty 1

## 2016-07-10 MED ORDER — METRONIDAZOLE 500 MG PO TABS
500.0000 mg | ORAL_TABLET | Freq: Three times a day (TID) | ORAL | Status: DC
  Administered 2016-07-10: 500 mg via ORAL
  Filled 2016-07-10: qty 1

## 2016-07-10 MED ORDER — LORAZEPAM 2 MG/ML IJ SOLN
1.0000 mg | Freq: Four times a day (QID) | INTRAMUSCULAR | Status: AC | PRN
  Administered 2016-07-10 – 2016-07-13 (×9): 1 mg via INTRAVENOUS
  Filled 2016-07-10 (×9): qty 1

## 2016-07-10 MED ORDER — ACETAMINOPHEN 650 MG RE SUPP: 650.0000 mg | Freq: Four times a day (QID) | RECTAL | Status: DC | PRN

## 2016-07-10 MED ORDER — DEXTROSE 5 % IV SOLN
1.0000 g | Freq: Three times a day (TID) | INTRAVENOUS | Status: DC
  Administered 2016-07-10 – 2016-07-13 (×8): 1 g via INTRAVENOUS
  Filled 2016-07-10 (×10): qty 1

## 2016-07-10 MED ORDER — BISACODYL 5 MG PO TBEC: 5.0000 mg | DELAYED_RELEASE_TABLET | Freq: Every day | ORAL | Status: DC | PRN

## 2016-07-10 MED ORDER — ONDANSETRON HCL 4 MG/2ML IJ SOLN: 4.0000 mg | Freq: Four times a day (QID) | INTRAMUSCULAR | Status: DC | PRN

## 2016-07-10 MED ORDER — ADULT MULTIVITAMIN W/MINERALS CH
1.0000 | ORAL_TABLET | Freq: Every day | ORAL | Status: DC
  Administered 2016-07-11 – 2016-07-17 (×7): 1 via ORAL
  Filled 2016-07-10 (×7): qty 1

## 2016-07-10 MED ORDER — FOLIC ACID 5 MG/ML IJ SOLN
1.0000 mg | Freq: Every day | INTRAMUSCULAR | Status: DC
  Filled 2016-07-10: qty 0.2

## 2016-07-10 MED ORDER — ACETAMINOPHEN 325 MG PO TABS
650.0000 mg | ORAL_TABLET | Freq: Four times a day (QID) | ORAL | Status: DC | PRN
  Administered 2016-07-12 – 2016-07-17 (×5): 650 mg via ORAL
  Filled 2016-07-10 (×5): qty 2

## 2016-07-10 MED ORDER — THIAMINE HCL 100 MG/ML IJ SOLN
100.0000 mg | Freq: Every day | INTRAMUSCULAR | Status: DC
  Filled 2016-07-10: qty 2

## 2016-07-10 MED ORDER — SODIUM CHLORIDE 0.9 % IV BOLUS (SEPSIS)
1000.0000 mL | Freq: Once | INTRAVENOUS | Status: AC
  Administered 2016-07-10: 1000 mL via INTRAVENOUS

## 2016-07-10 MED ORDER — MORPHINE SULFATE (PF) 4 MG/ML IV SOLN
1.0000 mg | INTRAVENOUS | Status: DC | PRN
  Administered 2016-07-10 – 2016-07-11 (×3): 1 mg via INTRAVENOUS
  Filled 2016-07-10 (×3): qty 1

## 2016-07-10 MED ORDER — ONDANSETRON HCL 4 MG/2ML IJ SOLN
4.0000 mg | Freq: Once | INTRAMUSCULAR | Status: AC
  Administered 2016-07-10: 4 mg via INTRAVENOUS
  Filled 2016-07-10: qty 2

## 2016-07-10 NOTE — Progress Notes (Signed)
Pharmacy Antibiotic Note  Calvin Mckee is a 66 y.o. 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  Pharmacy has been consulted for vancomycin and cefepime dosing.  Pt recently with toe amputation and failed outpt doxy, keflex and clinda.  Plan: Vancomycin 1500mg  IV q12h Cefepime 1gm IV q8h Follow renal function, cultures and clinical course    Temp (24hrs), Avg:98.6 F (37 C), Min:98.6 F (37 C), Max:98.6 F (37 C)   Recent Labs Lab 07/10/16 1600  WBC 13.7*  CREATININE 0.69    CrCl cannot be calculated (Unknown ideal weight.).    No Known Allergies  Antimicrobials this admission: 3/31 CTX x 1 3/31 flagyl x 1 3/31 vanc >> 3/31 cefepime >>   Microbiology results: 3/31 BCx: sent   Thank you for allowing pharmacy to be a part of this patient's care.  Dolly Rias RPh 07/10/2016, 6:29 PM Pager [phone]

## 2016-07-10 NOTE — ED Triage Notes (Signed)
He c/o right toe pain. He had a toe amputated 06-09-2016 in [place], [place]. Here today with pain in this toe and he is our of his "oxy".

## 2016-07-10 NOTE — ED Notes (Signed)
Palpable +1 dorsal pedis pulse L foot

## 2016-07-10 NOTE — ED Notes (Signed)
ED Provider at bedside. PA Harris at bedside.

## 2016-07-10 NOTE — ED Notes (Signed)
MRI tech states that pt was in too much pain to perform test. PA aware.

## 2016-07-10 NOTE — ED Notes (Signed)
Pt aware that urine specimen needs to be collected. Urinal at bedside.

## 2016-07-10 NOTE — ED Notes (Signed)
Bed: [facility]3 Expected date:  Expected time:  Means of arrival:  Comments: 

## 2016-07-10 NOTE — ED Notes (Signed)
ED Provider at bedside. Dr. Ralene Bathe at bedside.

## 2016-07-10 NOTE — ED Provider Notes (Signed)
[facility] DEPT Provider Note   CSN: [id number] Arrival date & time: 07/10/16  1430     History   Chief Complaint Chief Complaint  Patient presents with  . Toe Pain    HPI Calvin Mckee is a 66 y.o. male who presents emergency Department with chief complaint of right foot pain. Patient had a second toe amputation of the right foot 28 days ago at the [facility] in [place]. History is difficult to obtain from the patient to may be intoxicated but is acting histrionically and not very forthcoming with information. The patient states that he has been "writhing in agony" for the past 28 days since his surgery. He also denies any recent change in his foot pain but states that he is out of his oxycodone 10 mg. States that he did have follow-up in [place] recently and had his dressings changed and his foot cleaned. She denies a history of diabetes. He rates his pain at 10 out of 10 and throbbing. He denies fevers, chills, nausea, vomiting.Patient is currently on oral abx.   HPI  Past Medical History:  Diagnosis Date  . Arthritis   . Hypertension     Patient Active Problem List   Diagnosis Date Noted  . Toe amputation status, right ([place]) 07/06/2016    Past Surgical History:  Procedure Laterality Date  . CERVICAL FUSION         Home Medications    Prior to Admission medications   Medication Sig Start Date End Date Taking? Authorizing Provider  cephALEXin (KEFLEX) 500 MG capsule Take 1 capsule (500 mg total) by mouth 4 (four) times daily. Patient not taking: Reported on 07/06/2016 05/12/16   Comer Locket, PA-C  cholecalciferol (VITAMIN D) 1000 units tablet Take 1,000 Units by mouth 2 (two) times daily.    Historical Provider, MD  doxycycline (VIBRAMYCIN) 100 MG capsule Take 1 capsule (100 mg total) by mouth 2 (two) times daily. 07/06/16   Naiping Ephriam Jenkins, MD  ibuprofen (ADVIL,MOTRIN) 800 MG tablet Take 1 tablet (800 mg total) by mouth every 8 (eight) hours as needed.  07/06/16   Leandrew Koyanagi, MD  Multiple Vitamin (MULTIVITAMIN WITH MINERALS) TABS tablet Take 1 tablet by mouth daily.    Historical Provider, MD    Family History No family history on file.  Social History Social History  Substance Use Topics  . Smoking status: Current Every Day Smoker    Packs/day: 0.50    Types: Cigarettes  . Smokeless tobacco: Never Used  . Alcohol use Yes     Allergies   Patient has no known allergies.   Review of Systems Review of Systems  Ten systems reviewed and are negative for acute change, except as noted in the HPI.   Physical Exam Updated Vital Signs BP (!) 153/97 (BP Location: Left Arm)   Pulse (!) 108   Temp 98.6 F (37 C) (Oral)   Resp 16   SpO2 91%   Physical Exam  Constitutional: He appears well-developed and well-nourished. No distress.  HENT:  Head: Normocephalic and atraumatic.  Eyes: Conjunctivae are normal. No scleral icterus.  Neck: Normal range of motion. Neck supple.  Cardiovascular: Normal rate, regular rhythm and normal heart sounds.   Pulmonary/Chest: Effort normal and breath sounds normal. No respiratory distress.  Abdominal: Soft. There is no tenderness.  Musculoskeletal: He exhibits edema.  Right foot. Status post second toe amputation. The foot is edematous, erythematous and exquisitely tender to the touch. There is purulent discharge  from the surgical wound site with minor dehiscence. Unable to palpate Pulses secondary to patient's tolerance level.  Neurological: He is alert.  Skin: Skin is warm and dry. He is not diaphoretic.  Psychiatric: His behavior is normal.  Nursing note and vitals reviewed.    ED Treatments / Results  Labs (all labs ordered are listed, but only abnormal results are displayed) Labs Reviewed  CULTURE, BLOOD (ROUTINE X 2)  CULTURE, BLOOD (ROUTINE X 2)  BASIC METABOLIC PANEL  CBC WITH DIFFERENTIAL/PLATELET  SEDIMENTATION RATE  C-REACTIVE PROTEIN    EKG  EKG Interpretation None        Radiology No results found.  Procedures Procedures (including critical care time)  Medications Ordered in ED Medications  HYDROmorphone (DILAUDID) injection 1 mg (not administered)  ondansetron (ZOFRAN) injection 4 mg (not administered)  sodium chloride 0.9 % bolus 1,000 mL (not administered)     Initial Impression / Assessment and Plan / ED Course  I have reviewed the triage vital signs and the nursing notes.  Pertinent labs & imaging results that were available during my care of the patient were reviewed by me and considered in my medical decision making (see chart for details).  Clinical Course as of Jul 11 1538  Sat Jul 10, 2016  1539 Patient with recent toe amputation. I have a high suspicion for infection of the foot at this time. Labs are pending. If ordered blood cultures, patient will need a plain film to look for subcutaneous gas or potential osteomyelitis. I have ordered pain medications and fluids at this point.  [AH]    Clinical Course User Index [AH] Margarita Mail, PA-C     PATIENT WILL BE ADMITTED FOR SUSPECTED OSTEOMYELITIS UNABLE TO OBTAIN MRI AT THIS TIME DUE TO PATIENT INTOLERANCE  Final Clinical Impressions(s) / ED Diagnoses   Final diagnoses:  Foot infection  Osteomyelitis of right foot, unspecified type ([place])  Hyponatremia  Osteomyelitis [facility])    New Prescriptions New Prescriptions   No medications on file     Margarita Mail, PA-C 07/12/16 [place], MD 07/12/16 [phone]

## 2016-07-10 NOTE — ED Notes (Signed)
Patient transported to MRI 

## 2016-07-10 NOTE — H&P (Signed)
History and Physical    Calvin Mckee EXH:[id number] DOB: [birth year] DOA: 07/10/2016  PCP: Pcp Not In System   Patient coming from: Home  Chief Complaint: Right Foot Pain  HPI: Calvin Mckee is a 66 y.o. male with medical history significant of Recent Right 2nd toe Amputation (been on Abx with Keflex, Clindamycin, and Doxycycline), Tobacco Abuse, EtOH use, Polysubstance Abuse including recent Tobacco Abuse who presented with Right Foot pain that worsened over the last 2 days. Patient states that his leg started swelling 3 days ago and that he was not able to walk on it and put any pressure on it since yesterday. Patient recently had a 2nd Right Toe Amputation on March 6 at [facility] in [place] and recently saw Dr. Erlinda Hong at [facility] 07/06/16. States that he ran out of his pain meds and that toe and foot worsened over the last two days and now the pain is unbearable. No nausea, vomiting, chest pain or SOB. [facility] was called to admit patient for Cellulitis and Suspected Osteomyelitis.   ED Course: Was given a liter of fluid, given IV Abx, had bloodwork done and a Foot X-Ray suspicious for osteomyelitis.   Review of Systems: As per HPI otherwise 10 point review of systems negative. No lightheadedness or dizziness.   Past Medical History:  Diagnosis Date  . Arthritis   . Hypertension     Past Surgical History:  Procedure Laterality Date  . CERVICAL FUSION     SOCIAL HISTORY  reports that he has been smoking Cigarettes.  He has been smoking about 0.50 packs per day. He has never used smokeless tobacco. He reports that he drinks alcohol. He reports that he uses drugs, including "Crack" cocaine and Other-see comments.  No Known Allergies  FAMILY HISTORY Reviewed with the patient and is non-contributory to presenting complaint.  Prior to Admission medications   Medication Sig Start Date End Date Taking? Authorizing Provider  cholecalciferol (VITAMIN D) 1000 units  tablet Take 1,000 Units by mouth 2 (two) times daily.   Yes Historical Provider, MD  doxycycline (VIBRAMYCIN) 100 MG capsule Take 1 capsule (100 mg total) by mouth 2 (two) times daily. 07/06/16  Yes Naiping Ephriam Jenkins, MD  ibuprofen (ADVIL,MOTRIN) 800 MG tablet Take 1 tablet (800 mg total) by mouth every 8 (eight) hours as needed. 07/06/16  Yes Leandrew Koyanagi, MD  Multiple Vitamin (MULTIVITAMIN WITH MINERALS) TABS tablet Take 1 tablet by mouth daily.   Yes Historical Provider, MD  cephALEXin (KEFLEX) 500 MG capsule Take 1 capsule (500 mg total) by mouth 4 (four) times daily. Patient not taking: Reported on 07/10/2016 05/12/16   Comer Locket, PA-C   Physical Exam: Vitals:   07/10/16 1436 07/10/16 1722  BP: (!) 153/97 (!) 157/82  Pulse: (!) 108 90  Resp: 16 (!) 22  Temp: 98.6 F (37 C)   TempSrc: Oral   SpO2: 91% 94%   Constitutional: Thin AAM appears uncomfortable yelling in pain Eyes: Lids and conjunctivae normal, sclerae anicteric  ENMT: External Ears, Nose appear normal. Grossly normal hearing Neck: Appears normal, supple, no cervical masses, normal ROM, no appreciable thyromegaly, no JVD Respiratory: Clear to auscultation bilaterally, no wheezing, rales, rhonchi or crackles. Normal respiratory effort and patient is not tachypenic. No accessory muscle use.  Cardiovascular: Slightly tachycardi, no murmurs / rubs / gallops. S1 and S2 auscultated. 2+ Right Leg Lower Extremity Edema with warmth and some erythema; Difficult to palpate Right pedal pulse because of significant swelling  Abdomen: Soft, non-tender, non-distended. No masses palpated. No appreciable hepatosplenomegaly. Bowel sounds positive.  GU: Deferred. Musculoskeletal: No clubbing / cyanosis of digits/nails. Right toe ulceration with warmth and erythema and some necrosis. Skin: Right 2nd toe ulceration and swelling up to knee Neurologic: CN 2-12 grossly intact with no focal deficits.  Romberg sign cerebellar reflexes not assessed.    Psychiatric: Normal judgment and insight. Alert and oriented x 3. Normal mood and appropriate affect.   Labs on Admission: I have personally reviewed following labs and imaging studies  CBC:  Recent Labs Lab 07/10/16 1600  WBC 13.7*  NEUTROABS 10.0*  HGB 10.9*  HCT 30.9*  MCV 87.0  PLT 563*   Basic Metabolic Panel:  Recent Labs Lab 07/10/16 1600  NA 128*  K 3.0*  CL 92*  CO2 23  GLUCOSE 79  BUN 10  CREATININE 0.69  CALCIUM 9.3   GFR: CrCl cannot be calculated (Unknown ideal weight.). Liver Function Tests: No results for input(s): AST, ALT, ALKPHOS, BILITOT, PROT, ALBUMIN in the last 168 hours. No results for input(s): LIPASE, AMYLASE in the last 168 hours. No results for input(s): AMMONIA in the last 168 hours. Coagulation Profile: No results for input(s): INR, PROTIME in the last 168 hours. Cardiac Enzymes: No results for input(s): CKTOTAL, CKMB, CKMBINDEX, TROPONINI in the last 168 hours. BNP (last 3 results) No results for input(s): PROBNP in the last 8760 hours. HbA1C: No results for input(s): HGBA1C in the last 72 hours. CBG: No results for input(s): GLUCAP in the last 168 hours. Lipid Profile: No results for input(s): CHOL, HDL, LDLCALC, TRIG, CHOLHDL, LDLDIRECT in the last 72 hours. Thyroid Function Tests: No results for input(s): TSH, T4TOTAL, FREET4, T3FREE, THYROIDAB in the last 72 hours. Anemia Panel: No results for input(s): VITAMINB12, FOLATE, FERRITIN, TIBC, IRON, RETICCTPCT in the last 72 hours. Urine analysis:    Component Value Date/Time   COLORURINE STRAW (A) 07/10/2016 1803   APPEARANCEUR CLEAR 07/10/2016 1803   LABSPEC 1.008 07/10/2016 1803   PHURINE 5.0 07/10/2016 1803   GLUCOSEU NEGATIVE 07/10/2016 1803   HGBUR SMALL (A) 07/10/2016 1803   BILIRUBINUR NEGATIVE 07/10/2016 1803   KETONESUR NEGATIVE 07/10/2016 1803   PROTEINUR NEGATIVE 07/10/2016 1803   UROBILINOGEN 1.0 10/09/2010 1111   NITRITE NEGATIVE 07/10/2016 1803    LEUKOCYTESUR NEGATIVE 07/10/2016 1803   Sepsis Labs: !!!!!!!!!!!!!!!!!!!!!!!!!!!!!!!!!!!!!!!!!!!! @LABRCNTIP (procalcitonin:4,lacticidven:4) )No results found for this or any previous visit (from the past 240 hour(s)).   Radiological Exams on Admission: Ap / Lateral X-ray Right Foot  Result Date: 07/10/2016 CLINICAL DATA:  RIGHT foot pain, recent amputation of second toe in February, pain and that this toe EXAM: RIGHT FOOT - 2 VIEW COMPARISON:  05/12/2016 FINDINGS: Interval amputation of second toe through the proximal phalanx. Poorly defined osseous stump at the proximal phalanx, may related to resorption secondary to amputation though infection is not excluded with this appearance. Mild soft tissue swelling at the stump in more diffusely at the dorsum of the entire RIGHT foot. Joint space narrowing at first MTP joint. Remaining joint spaces preserved. No acute fracture, dislocation or additional bone destruction. IMPRESSION: Prior amputation of RIGHT second toe through the proximal phalanx with poor definition of the osseous stump, could represent resorption secondary to resection but osteomyelitis is not excluded. If there is persistent clinical concern for osteomyelitis or soft tissue abscess in the RIGHT foot, consider MR. Electronically Signed   By: Lavonia Dana M.D.   On: 07/10/2016 16:19   EKG: No EKG Done in  ED. Will order.   Assessment/Plan Principal Problem:   Osteomyelitis (HCC) Active Problems:   Hypokalemia   Pain and swelling of right lower leg   Alcohol use   Polysubstance abuse   Tobacco use disorder   Cellulitis and abscess of toe of right foot  Right Foot Second Toe Ulceration and Cellulitis with Concern for Right Foot Osteomyelitis  -Failed Outpatient Treatment with Keflex, Clindamycin, and Doxycycline and had operation on March 6th -Admit to Med Telemetry -Broad Spectrum Abx with IV Vanc and IV Cefepime with Pharmacy to dose -Foot X-Ray showed Prior amputation of RIGHT  second toe through the proximal phalanx with poor definition of the osseous stump, could represent resorption secondary to resection but osteomyelitis is not excluded. -Will obtain Right foot MRI -Pulse able to be found by doppler -Foot and Leg extremely swollen so will obtain Vascular Duplex to r/o DVT -Furosemide 40 mg x1 for significant Leg swelling (2+ Pitting Edema on Right) -Patient was bolused 1 liter of Fluid in the ED -WBC was 13.7; Blood Cx Sent -Repeat CBC in AM -Based on Findings of MRI will likely consult Orthopedics (Patient has seen [employer]) - Saw Orthopedics Dr. Erlinda Hong on 07/06/16 and was given Doxycycline and Ibuprofen  -Pain control with Tramadol and Morphine -Wound Care Nurse Consultation appreciated  Hypokalemia/Hyponatremia -Was bolused 1 Liter in ED -Replete K+ as it was 3.0; Low Sodium possible from [place] -Repeat CMP in AM as patient was given dose of lasix for leg swelling  Right Leg Swelling -Check Venous Doppler -Dose of 1x IV Lasix -Continue to Monitor  EtOH Use Concern for Withdrawal -Check EtOH level  -Patient states he drinks at leas 2 beers Daily -CIWA Protocol with Lorazepam -C/w MVI, Folic Acid and Thiamine  Tobacco Abuse -Nicotine Patch 14 mg TD -Smoking Cessation Counseling Given  Hx of Polysubstance Abuse -Check UDS -Has Hx of Cocaine Abuse and states he did smoked Crack Cocaine 2 weeks ago.   DVT prophylaxis: Lovenox 40 mg sq q24h; Right Leg SCD Code Status: FULL CODE Family Communication: No family present at bedside Disposition Plan: Pending further Clinical Workup Consults called: None Admission status: Inpatient Telemetry   [facility], D.O. [facility] Pager [phone]  If 7PM-7AM, please contact night-coverage [email or web address] Password [facility]  07/10/2016, 6:53 PM

## 2016-07-10 NOTE — Progress Notes (Signed)
Pt repeatedly attempting to get out of bed due to pain in RLE. Pt also sitting on edge of bed and falling backwards, rocking back and forth, leaning over edge of bed. He is turning over in the bed, kneeling on the bed, and will not lay still, causing his bed alarm to repeatedly go off. Pt is a high fall risk due to medication received and not being compliant with the nursing staff's requests. Safety sitter placed at bedside. Will continue to monitor pt closely. Carnella [place] I

## 2016-07-10 NOTE — ED Notes (Signed)
Report to Hinton Dyer, Therapist, sports on 4E

## 2016-07-11 ENCOUNTER — Encounter (HOSPITAL_COMMUNITY): Payer: Self-pay | Admitting: Nurse Practitioner

## 2016-07-11 DIAGNOSIS — F1123 Opioid dependence with withdrawal: Secondary | ICD-10-CM

## 2016-07-11 LAB — CBC WITH DIFFERENTIAL/PLATELET
Basophils Absolute: 0 10*3/uL (ref 0.0–0.1)
Basophils Relative: 0 %
EOS PCT: 0 %
Eosinophils Absolute: 0 10*3/uL (ref 0.0–0.7)
HCT: 35.2 % — ABNORMAL LOW (ref 39.0–52.0)
HEMOGLOBIN: 12.4 g/dL — AB (ref 13.0–17.0)
LYMPHS ABS: 1.7 10*3/uL (ref 0.7–4.0)
LYMPHS PCT: 11 %
MCH: 30.8 pg (ref 26.0–34.0)
MCHC: 35.2 g/dL (ref 30.0–36.0)
MCV: 87.6 fL (ref 78.0–100.0)
MONOS PCT: 10 %
Monocytes Absolute: 1.4 10*3/uL — ABNORMAL HIGH (ref 0.1–1.0)
NEUTROS PCT: 79 %
Neutro Abs: 11.7 10*3/uL — ABNORMAL HIGH (ref 1.7–7.7)
PLATELETS: 523 10*3/uL — AB (ref 150–400)
RBC: 4.02 MIL/uL — AB (ref 4.22–5.81)
RDW: 13.4 % (ref 11.5–15.5)
WBC: 14.8 10*3/uL — AB (ref 4.0–10.5)

## 2016-07-11 LAB — COMPREHENSIVE METABOLIC PANEL
ALT: 20 U/L (ref 17–63)
AST: 26 U/L (ref 15–41)
Albumin: 3.3 g/dL — ABNORMAL LOW (ref 3.5–5.0)
Alkaline Phosphatase: 71 U/L (ref 38–126)
Anion gap: 12 (ref 5–15)
BUN: 10 mg/dL (ref 6–20)
CHLORIDE: 94 mmol/L — AB (ref 101–111)
CO2: 26 mmol/L (ref 22–32)
CREATININE: 0.73 mg/dL (ref 0.61–1.24)
Calcium: 9.3 mg/dL (ref 8.9–10.3)
GFR calc Af Amer: >60 mL/min (ref >60)
GFR calc non Af Amer: >60 mL/min (ref >60)
Glucose, Bld: 73 mg/dL (ref 65–99)
Potassium: 3.5 mmol/L (ref 3.5–5.1)
SODIUM: 132 mmol/L — AB (ref 135–145)
Total Bilirubin: 1 mg/dL (ref 0.3–1.2)
Total Protein: 8.1 g/dL (ref 6.5–8.1)

## 2016-07-11 LAB — TSH: TSH: 0.615 u[IU]/mL (ref 0.350–4.500)

## 2016-07-11 LAB — GLUCOSE, CAPILLARY: GLUCOSE-CAPILLARY: 86 mg/dL (ref 65–99)

## 2016-07-11 LAB — HIV ANTIBODY (ROUTINE TESTING W REFLEX): HIV Screen 4th Generation wRfx: NONREACTIVE

## 2016-07-11 LAB — MAGNESIUM: Magnesium: 1.8 mg/dL (ref 1.7–2.4)

## 2016-07-11 LAB — PHOSPHORUS: PHOSPHORUS: 3.7 mg/dL (ref 2.5–4.6)

## 2016-07-11 MED ORDER — OXYCODONE HCL 5 MG PO TABS: 5.0000 mg | ORAL_TABLET | Freq: Four times a day (QID) | ORAL | Status: DC | PRN

## 2016-07-11 MED ORDER — HYDROMORPHONE HCL 1 MG/ML IJ SOLN
1.0000 mg | Freq: Once | INTRAMUSCULAR | Status: AC
  Administered 2016-07-11: 1 mg via INTRAVENOUS
  Filled 2016-07-11: qty 1

## 2016-07-11 MED ORDER — HYDROCODONE-ACETAMINOPHEN 5-325 MG PO TABS: 1.0000 | ORAL_TABLET | Freq: Four times a day (QID) | ORAL | Status: DC | PRN

## 2016-07-11 MED ORDER — SENNOSIDES-DOCUSATE SODIUM 8.6-50 MG PO TABS
1.0000 | ORAL_TABLET | Freq: Two times a day (BID) | ORAL | Status: DC
  Administered 2016-07-11 – 2016-07-17 (×12): 1 via ORAL
  Filled 2016-07-11 (×12): qty 1

## 2016-07-11 MED ORDER — MORPHINE SULFATE (PF) 4 MG/ML IV SOLN
2.0000 mg | INTRAVENOUS | Status: DC | PRN
  Administered 2016-07-11 – 2016-07-17 (×17): 2 mg via INTRAVENOUS
  Filled 2016-07-11 (×18): qty 1

## 2016-07-11 MED ORDER — POLYETHYLENE GLYCOL 3350 17 G PO PACK
17.0000 g | PACK | Freq: Two times a day (BID) | ORAL | Status: DC
  Administered 2016-07-11 – 2016-07-17 (×12): 17 g via ORAL
  Filled 2016-07-11 (×12): qty 1

## 2016-07-11 MED ORDER — OXYCODONE-ACETAMINOPHEN 5-325 MG PO TABS
1.0000 | ORAL_TABLET | Freq: Three times a day (TID) | ORAL | Status: DC | PRN
  Administered 2016-07-11 – 2016-07-17 (×17): 1 via ORAL
  Filled 2016-07-11 (×19): qty 1

## 2016-07-11 NOTE — Progress Notes (Signed)
Cellulitis present right foot.  Second toe incision looks reasonable. Need MRI scan to assess further infection within the foot.  No fluctuance is present but erythema and cellulitis is present.

## 2016-07-11 NOTE — Progress Notes (Signed)
MD Alfredia Ferguson paged to be made aware pt had 1-2 mins of bigeminy PVCs. Pt asymtomatic, VSS. Will continue to monitor.

## 2016-07-11 NOTE — Progress Notes (Signed)
PROGRESS NOTE    Calvin Mckee  HCW:[id number] DOB: 1950/06/21 DOA: 07/10/2016 PCP: Pcp Not In System  Brief Narrative:  Calvin Mckee is a 66 y.o. male with medical history significant of Recent Right 2nd toe Amputation (been on Abx with Keflex, Clindamycin, and Doxycycline), Tobacco Abuse, EtOH use, Polysubstance Abuse including recent Tobacco Abuse who presented with Right Foot pain that worsened over the last 2 days. Patient states that his leg started swelling 3 days ago and that he was not able to walk on it and put any pressure on it since yesterday. Patient recently had a 2nd Right Toe Amputation on March 6 at [facility] in [place] and recently saw Dr. Erlinda Hong at [facility] 07/06/16. States that he ran out of his pain meds and that toe and foot worsened over the last two days and now the pain is unbearable. No nausea, vomiting, chest pain or SOB. TRH was called to admit patient for Cellulitis and Suspected Osteomyelitis. Consulted Orthopedics and Awaiting MRI still to be done as patient not able to sit still to do examination yet.   Assessment & Plan:   Principal Problem:   Osteomyelitis ([place]) Active Problems:   Hypokalemia   Pain and swelling of right lower leg   Alcohol use   Polysubstance abuse   Tobacco use disorder   Cellulitis and abscess of toe of right foot  Right Foot Second Toe Ulceration and Cellulitis with Concern for Right Foot Osteomyelitis  -Failed Outpatient Treatment with Keflex, Clindamycin, and Doxycycline and had operation on March 6th -Admitted to Med Telemetry -C/w [employer] Spectrum Abx with IV Vanc and IV Cefepime with Pharmacy to dose -Foot X-Ray showed Prior amputation of RIGHT second toe through the proximal phalanx with poor definition of the osseous stump, could represent resorption secondary to resection but osteomyelitis is not excluded. -Will obtain Right foot MRI ordered and pending to be done -Pulse able to be found by doppler -Foot  and Leg extremely swollen so will obtain Vascular Duplex to r/o DVT -Furosemide 40 mg x1 for significant Leg swelling (2+ Pitting Edema on Right) -> Improved -Patient was bolused 1 liter of Fluid in the ED -WBC went from 13.7 -> 14.8; Blood Cx Sent and show NGTD -Repeat CBC in AM -Consulted Orthopedics (Patient has seen [employer]) - Saw Orthopedics Dr. Erlinda Hong on 07/06/16 and was given Doxycycline and Ibuprofen; Awaiting for MRI to be done -Pain control with Tramadol 50 mg po q6hprn and Morphine 2 mg IV q4hprn; Added Oxycodone-Acetaminophen 1 tab po q8hprn -Wound Care Nurse Consultation appreciated  Hypokalemia/Hyponatremia, improving -Was bolused 1 Liter in ED -Replete K+ as it was 3.0 and now 3.5; Low Sodium possible from [facility] and improving -Repeat CMP in AM as patient was given dose of lasix for leg swelling  Right Leg Swelling, improved -Check Venous Doppler ordered and still pending -Dose of 1x IV Lasix and improved -Continue to Monitor  EtOH Use Concern for Withdrawal of Alcohol and Opiates -Patient states he drinks at leas 2 beers Daily -CIWA Protocol with Lorazepam -C/w MVI, Folic Acid and Thiamine -C/w Percocet q8hprn and Morphine q4hprn  Tobacco Abuse -Nicotine Patch 14 mg TD -Smoking Cessation Counseling Given  Hx of Polysubstance Abuse -Checked UDS and was positive for Opiates -Has Hx of Cocaine Abuse and states he did smoked Crack Cocaine 2 weeks ago.   DVT prophylaxis: Lovenox 40 mg sq Code Status: FULL CODE Family Communication: No Family present at Disposition Plan: Pending PT Evaluation  and improvement of clinical symptoms  Consultants:   Orthopedic Surgery Dr. Marlou Sa    Procedures: MRI of Right Foot still pending   Antimicrobials:  Anti-infectives    Start     Dose/Rate Route Frequency Ordered Stop   07/10/16 2200  ceFEPIme (MAXIPIME) 1 g in dextrose 5 % 50 mL IVPB     1 g 100 mL/hr over 30 Minutes Intravenous Every 8 hours  07/10/16 1830     07/10/16 1830  vancomycin (VANCOCIN) 1,500 mg in sodium chloride 0.9 % 500 mL IVPB     1,500 mg 250 mL/hr over 120 Minutes Intravenous Every 12 hours 07/10/16 1826     07/10/16 1700  cefTRIAXone (ROCEPHIN) 2 g in dextrose 5 % 50 mL IVPB  Status:  Discontinued     2 g 100 mL/hr over 30 Minutes Intravenous Every 24 hours 07/10/16 1655 07/10/16 1826   07/10/16 1700  metroNIDAZOLE (FLAGYL) tablet 500 mg  Status:  Discontinued     500 mg Oral Every 8 hours 07/10/16 1655 07/10/16 1826     Subjective: Seen and examined and was kneeling in bed agitated complaining of extreme foot pain. No nausea or vomiting. Had no CP or SOB and wanted better pain control. No other concerns or complaints.   Objective: Vitals:   07/10/16 1908 07/10/16 2213 07/11/16 0447 07/11/16 0448  BP: (!) 173/110 (!) 163/99  (!) 177/77  Pulse: 93 (!) 109  (!) 108  Resp: 18 20  20   Temp: 98.7 F (37.1 C) 99.6 F (37.6 C)  99.1 F (37.3 C)  TempSrc: Oral Oral  Oral  SpO2: 95% 100%  98%  Weight: 74.5 kg (164 lb 3.9 oz)  74.6 kg (164 lb 7.4 oz)   Height: 5\' 8"  (1.727 m)       Intake/Output Summary (Last 24 hours) at 07/11/16 0756 Last data filed at 07/11/16 0600  Gross per 24 hour  Intake             1200 ml  Output             2475 ml  Net            -1275 ml   Filed Weights   07/10/16 1908 07/11/16 0447  Weight: 74.5 kg (164 lb 3.9 oz) 74.6 kg (164 lb 7.4 oz)   Examination: Physical Exam:  Constitutional: Thin AAM appears uncomfortable yelling in pain and bending over in the bed in pain Eyes: Lids and conjunctivae normal, sclerae anicteric  ENMT: External Ears, Nose appear normal. Grossly normal hearing Neck: Appears normal, supple, no cervical masses, normal ROM, no appreciable thyromegaly, no JVD Respiratory: Clear to auscultation bilaterally, no wheezing, rales, rhonchi or crackles. Normal respiratory effort and patient is not tachypenic. No accessory muscle use.  Cardiovascular:  Slightly tachycardic, no murmurs / rubs / gallops. S1 and S2 auscultated. Trace1+ Right Leg Lower Extremity Edema improved warmth and some erythema; Difficult to palpate Right pedal pulse because of swelling  Abdomen: Soft, non-tender, non-distended. No masses palpated. No appreciable hepatosplenomegaly. Bowel sounds positive.  GU: Deferred. Musculoskeletal: No clubbing / cyanosis of digits/nails. Right toe ulceration with warmth and erythema and some necrosis. Skin: Right 2nd toe ulceration and pedal swelling Neurologic: CN 2-12 grossly intact with no focal deficits.  Romberg sign cerebellar reflexes not assessed.  Psychiatric: Normal judgment and insight. Alert and awake. Agitated and Anxious mood and inappropriate affect.   Labs on Admission: I have personally reviewed following labs and imaging studies  Data Reviewed: I have personally reviewed following labs and imaging studies  CBC:  Recent Labs Lab 07/10/16 1600 07/11/16 0346  WBC 13.7* 14.8*  NEUTROABS 10.0* 11.7*  HGB 10.9* 12.4*  HCT 30.9* 35.2*  MCV 87.0 87.6  PLT 417* 008*   Basic Metabolic Panel:  Recent Labs Lab 07/10/16 1600 07/11/16 0346  NA 128* 132*  K 3.0* 3.5  CL 92* 94*  CO2 23 26  GLUCOSE 79 73  BUN 10 10  CREATININE 0.69 0.73  CALCIUM 9.3 9.3  MG  --  1.8  PHOS  --  3.7   GFR: Estimated Creatinine Clearance: 91.4 mL/min (by C-G formula based on SCr of 0.73 mg/dL). Liver Function Tests:  Recent Labs Lab 07/11/16 0346  AST 26  ALT 20  ALKPHOS 71  BILITOT 1.0  PROT 8.1  ALBUMIN 3.3*   No results for input(s): LIPASE, AMYLASE in the last 168 hours. No results for input(s): AMMONIA in the last 168 hours. Coagulation Profile: No results for input(s): INR, PROTIME in the last 168 hours. Cardiac Enzymes: No results for input(s): CKTOTAL, CKMB, CKMBINDEX, TROPONINI in the last 168 hours. BNP (last 3 results) No results for input(s): PROBNP in the last 8760 hours. HbA1C: No results for  input(s): HGBA1C in the last 72 hours. CBG: No results for input(s): GLUCAP in the last 168 hours. Lipid Profile: No results for input(s): CHOL, HDL, LDLCALC, TRIG, CHOLHDL, LDLDIRECT in the last 72 hours. Thyroid Function Tests:  Recent Labs  07/11/16 0346  TSH 0.615   Anemia Panel: No results for input(s): VITAMINB12, FOLATE, FERRITIN, TIBC, IRON, RETICCTPCT in the last 72 hours. Sepsis Labs: No results for input(s): PROCALCITON, LATICACIDVEN in the last 168 hours.  No results found for this or any previous visit (from the past 240 hour(s)).   Radiology Studies: Ap / Lateral X-ray Right Foot  Result Date: 07/10/2016 CLINICAL DATA:  RIGHT foot pain, recent amputation of second toe in February, pain and that this toe EXAM: RIGHT FOOT - 2 VIEW COMPARISON:  05/12/2016 FINDINGS: Interval amputation of second toe through the proximal phalanx. Poorly defined osseous stump at the proximal phalanx, may related to resorption secondary to amputation though infection is not excluded with this appearance. Mild soft tissue swelling at the stump in more diffusely at the dorsum of the entire RIGHT foot. Joint space narrowing at first MTP joint. Remaining joint spaces preserved. No acute fracture, dislocation or additional bone destruction. IMPRESSION: Prior amputation of RIGHT second toe through the proximal phalanx with poor definition of the osseous stump, could represent resorption secondary to resection but osteomyelitis is not excluded. If there is persistent clinical concern for osteomyelitis or soft tissue abscess in the RIGHT foot, consider MR. Electronically Signed   By: Lavonia Dana M.D.   On: 07/10/2016 16:19   Scheduled Meds: . ceFEPime (MAXIPIME) IV  1 g Intravenous Q8H  . cholecalciferol  1,000 Units Oral BID  . enoxaparin (LOVENOX) injection  40 mg Subcutaneous Q24H  . folic acid  1 mg Oral Daily  . multivitamin with minerals  1 tablet Oral Daily  . nicotine  14 mg Transdermal Daily  .  thiamine  100 mg Oral Daily   Or  . thiamine  100 mg Intravenous Daily  . vancomycin  1,500 mg Intravenous Q12H   Continuous Infusions:   LOS: 1 day   Kerney Elbe, DO [facility] Pager [phone]  If 7PM-7AM, please contact night-coverage [email or web address] Password [facility] 07/11/2016, 7:56 AM

## 2016-07-11 NOTE — Progress Notes (Signed)
Patient continues to be restless and anxious, attempting to get OOB multiple times unassisted this shift. Pt attempted sliding through raised siderails many times. Also stood OOB unassisted and was extremely unsteady on his feet, turning circles and getting tangled in tubes and equipment attached to him, kneeling on the bed and stating he needs to "go home to get my Percocet 20." Pt states he takes Percocet at home. When asked how many at a time, pt states "as many as I can get." Pt states, "If you'd give me some Percocet, I wouldn't be in this position." Pt stated multiple times this AM he wants to leave AMA so he can go get some Percocet and come back again- Hospitalist made aware and ordered 1x Dilaudid. Pt agreed to try the Dilaudid instead of leaving AMA. PRN IV Ativan and IV Dilaudid effective for a short time. Pt now requesting Percocet again, stating he cannot take Vicodin because it makes him nauseated. Hospitalist paged to be made aware. RN at bedside to prevent pt from getting OOB unassisted. Bed alarm on sensitive setting, bed in lowest position.

## 2016-07-12 ENCOUNTER — Inpatient Hospital Stay (HOSPITAL_COMMUNITY): Payer: Medicare Other

## 2016-07-12 ENCOUNTER — Encounter (HOSPITAL_COMMUNITY): Payer: Self-pay

## 2016-07-12 LAB — COMPREHENSIVE METABOLIC PANEL
ALK PHOS: 61 U/L (ref 38–126)
ALT: 18 U/L (ref 17–63)
AST: 24 U/L (ref 15–41)
Albumin: 2.9 g/dL — ABNORMAL LOW (ref 3.5–5.0)
Anion gap: 8 (ref 5–15)
BILIRUBIN TOTAL: 0.7 mg/dL (ref 0.3–1.2)
BUN: 9 mg/dL (ref 6–20)
CALCIUM: 8.9 mg/dL (ref 8.9–10.3)
CO2: 28 mmol/L (ref 22–32)
CREATININE: 0.62 mg/dL (ref 0.61–1.24)
Chloride: 97 mmol/L — ABNORMAL LOW (ref 101–111)
Glucose, Bld: 107 mg/dL — ABNORMAL HIGH (ref 65–99)
Potassium: 3.6 mmol/L (ref 3.5–5.1)
Sodium: 133 mmol/L — ABNORMAL LOW (ref 135–145)
Total Protein: 7 g/dL (ref 6.5–8.1)

## 2016-07-12 LAB — CBC WITH DIFFERENTIAL/PLATELET
BASOS ABS: 0 10*3/uL (ref 0.0–0.1)
Basophils Relative: 0 %
EOS PCT: 0 %
Eosinophils Absolute: 0 10*3/uL (ref 0.0–0.7)
HEMATOCRIT: 31 % — AB (ref 39.0–52.0)
HEMOGLOBIN: 11 g/dL — AB (ref 13.0–17.0)
LYMPHS ABS: 2.1 10*3/uL (ref 0.7–4.0)
LYMPHS PCT: 16 %
MCH: 31.2 pg (ref 26.0–34.0)
MCHC: 35.5 g/dL (ref 30.0–36.0)
MCV: 87.8 fL (ref 78.0–100.0)
Monocytes Absolute: 1.7 10*3/uL — ABNORMAL HIGH (ref 0.1–1.0)
Monocytes Relative: 13 %
NEUTROS ABS: 9.2 10*3/uL — AB (ref 1.7–7.7)
Neutrophils Relative %: 71 %
Platelets: 389 10*3/uL (ref 150–400)
RBC: 3.53 MIL/uL — AB (ref 4.22–5.81)
RDW: 13.6 % (ref 11.5–15.5)
WBC: 13.1 10*3/uL — AB (ref 4.0–10.5)

## 2016-07-12 LAB — GLUCOSE, CAPILLARY: Glucose-Capillary: 125 mg/dL — ABNORMAL HIGH (ref 65–99)

## 2016-07-12 LAB — URINE CULTURE: Culture: NO GROWTH

## 2016-07-12 LAB — PHOSPHORUS: PHOSPHORUS: 2.6 mg/dL (ref 2.5–4.6)

## 2016-07-12 LAB — HEMOGLOBIN A1C
HEMOGLOBIN A1C: 6.2 % — AB (ref 4.8–5.6)
MEAN PLASMA GLUCOSE: 131 mg/dL

## 2016-07-12 LAB — MAGNESIUM: MAGNESIUM: 1.8 mg/dL (ref 1.7–2.4)

## 2016-07-12 MED ORDER — AMLODIPINE BESYLATE 10 MG PO TABS
10.0000 mg | ORAL_TABLET | Freq: Every day | ORAL | Status: DC
  Administered 2016-07-12 – 2016-07-17 (×6): 10 mg via ORAL
  Filled 2016-07-12 (×6): qty 1

## 2016-07-12 MED ORDER — LORAZEPAM 2 MG/ML IJ SOLN
2.0000 mg | Freq: Once | INTRAMUSCULAR | Status: AC
  Administered 2016-07-12: 2 mg via INTRAVENOUS
  Filled 2016-07-12: qty 1

## 2016-07-12 MED ORDER — HYDRALAZINE HCL 20 MG/ML IJ SOLN
10.0000 mg | INTRAMUSCULAR | Status: DC | PRN
  Filled 2016-07-12: qty 1

## 2016-07-12 MED ORDER — FUROSEMIDE 10 MG/ML IJ SOLN
20.0000 mg | Freq: Once | INTRAMUSCULAR | Status: AC
  Administered 2016-07-12: 20 mg via INTRAVENOUS
  Filled 2016-07-12: qty 2

## 2016-07-12 NOTE — Progress Notes (Signed)
Awaiting MRI scan of foot for further evaluation

## 2016-07-12 NOTE — Care Management Note (Signed)
Case Management Note  Patient Details  Name: Calvin Mckee MRN: [id number] Date of Birth: Dec 20, 1950  Subjective/Objective: 66 y/o m admitted w/Osteomy. From home. Await MRI foot. PT cons-await recc.                   Action/Plan:d/c plan home.   Expected Discharge Date:                  Expected Discharge Plan:  [place]  In-House Referral:     Discharge planning Services  CM Consult  Post Acute Care Choice:    Choice offered to:     DME Arranged:    DME Agency:     HH Arranged:    [place] Agency:     Status of Service:  In process, will continue to follow  If discussed at Long Length of Stay Meetings, dates discussed:    Additional Comments:  Dessa Phi, RN 07/12/2016, 3:52 PM

## 2016-07-12 NOTE — Evaluation (Signed)
Occupational Therapy Evaluation Patient Details Name: Calvin Mckee MRN: [id number] DOB: 1951-01-05 Today's Date: 07/12/2016    History of Present Illness Pt with osteomyelitis in RLE; PMHx of R second toe amputation on 06/15/16, tobacco abuse, EtOH abuse, and polysubtance abuse   Clinical Impression   Pt is a 66 y/o M who presents with the above symptoms. Pt with decreased safety awareness and slight impulsivity due to pain in R LE. Pt will benefit from continued acute OT services to increase safety and independence during ADL and functional mobility tasks. Goals have been set for MinA to [employer].     Follow Up Recommendations  SNF;Supervision/Assistance - 24 hour    Equipment Recommendations  Other (TBD at next venue)            Precautions / Restrictions Precautions Precautions: Fall Restrictions Weight Bearing Restrictions: Yes Other Position/Activity Restrictions: Currently keeping Pt NWB RLE until further clarification/MRI results      Mobility Bed Mobility Overal bed mobility: Needs Assistance Bed Mobility: Supine to Sit     Supine to sit: Min guard;HOB elevated        Transfers Overall transfer level: Needs assistance Equipment used: Rolling walker (2 wheeled) Transfers: Sit to/from [facility] Sit to Stand: Min guard Stand pivot transfers: Min assist       General transfer comment: Verbal cues for stand pivot and sit to stand transfers for proper hand placement, verbal cues for sequencing use of RW during stand-pivot transfer while remaining NWB of RLE    Balance Overall balance assessment: Needs assistance Sitting-balance support: Bilateral upper extremity supported;Feet supported (LLE supported) Sitting balance-Leahy Scale: Fair     Standing balance support: Bilateral upper extremity supported Standing balance-Leahy Scale: Poor                             ADL either performed or assessed with clinical judgement    ADL Overall ADL's : Needs assistance/impaired Eating/Feeding: Independent   Grooming: Wash/dry hands;Sitting;Supervision/safety   Upper Body Bathing: Min guard;Cueing for safety;Sitting   Lower Body Bathing: Minimal assistance;Cueing for safety;Sit to/from stand   Upper Body Dressing : Min guard;Sitting   Lower Body Dressing: Moderate assistance;Cueing for safety;Cueing for sequencing;Sit to/from stand   Toilet Transfer: Minimal assistance;Stand-pivot;BSC;Cueing for safety   Toileting- Clothing Manipulation and Hygiene: Moderate assistance;Cueing for safety;Sit to/from stand   Tub/ Shower Transfer: Minimal assistance;Stand-pivot;3 in 1;Cueing for safety   Functional mobility during ADLs: Minimal assistance;Cueing for safety;Rolling walker;Cueing for sequencing General ADL Comments: Pt requires verbal cues during ADL tasks to ensure safety, proper hand placement during sit/stand transfers and for sequence of stand-pivot transfers.                          Pertinent Vitals/Pain Pain Assessment: Faces Faces Pain Scale: Hurts whole lot Pain Location: R foot  Pain Descriptors / Indicators: Constant Pain Intervention(s): Limited activity within patient's tolerance;Monitored during session;Premedicated before session;Patient requesting pain meds-RN notified;Repositioned          Extremity/Trunk Assessment Upper Extremity Assessment Upper Extremity Assessment: Overall WFL for tasks assessed                Cognition Arousal/Alertness: Awake/alert Behavior During Therapy: Restless;Impulsive Overall Cognitive Status: Impaired/Different from baseline Area of Impairment: Problem solving  Problem Solving: Slow processing General Comments: Pt required repetition throughout session for following directions; appeared restless due to pain in R foot                      Home Living Family/patient expects to be discharged to::  Skilled nursing facility                                 Additional Comments: Prior to admission, Pt reports he was staying with his sister. Questionable whether he would have assistance 24/7.       Prior Functioning/Environment Level of Independence: Needs assistance        Comments: Reports he was previously staying with his sister and receiving assistance from her. Unclear as to how much assistance needed.         OT Problem List: Decreased strength;Impaired balance (sitting and/or standing);Decreased safety awareness;Decreased knowledge of use of DME or AE;Pain      OT Treatment/Interventions: Self-care/ADL training;Therapeutic exercise;Energy conservation;DME and/or AE instruction;Therapeutic activities;Patient/family education;Balance training    OT Goals(Current goals can be found in the care plan section) Acute Rehab OT Goals Patient Stated Goal: To be more independent.  OT Goal Formulation: With patient Time For Goal Achievement: 07/19/16 Potential to Achieve Goals: Good ADL Goals Pt Will Perform Lower Body Dressing: with min guard assist;sit to/from stand Pt Will Transfer to Toilet: with min guard assist;ambulating;bedside commode Pt Will Perform Toileting - Clothing Manipulation and hygiene: with min guard assist;sit to/from stand Additional ADL Goal #1: Pt will complete grooming standing at sink with Min Guard assist.   OT Frequency: Min 2X/week                             End of Session Equipment Utilized During Treatment: Gait belt;Rolling walker Nurse Communication: Patient requests pain meds  Activity Tolerance: Patient tolerated treatment well Patient left: in bed;with call bell/phone within reach;with bed alarm set  OT Visit Diagnosis: Unsteadiness on feet (R26.81);Muscle weakness (generalized) (M62.81)                Time: 6468-0321 OT Time Calculation (min): 28 min Charges:  OT General Charges $OT Visit: 1 Procedure OT  Evaluation $OT Eval Low Complexity: 1 Procedure OT Treatments $Self Care/Home Management : 8-22 mins G-Codes:     Lou Cal, OT Pager [phone] 07/12/2016  Raymondo Band 07/12/2016, 4:35 PM

## 2016-07-12 NOTE — Progress Notes (Signed)
PROGRESS NOTE    Calvin Mckee  CBU:[id number] DOB: [birth year] DOA: 07/10/2016 PCP: Pcp Not In System  Brief Narrative:  Calvin Mckee is a 66 y.o. male with medical history significant of Recent Right 2nd toe Amputation (been on Abx with Keflex, Clindamycin, and Doxycycline), Tobacco Abuse, EtOH use, Polysubstance Abuse including recent Tobacco Abuse who presented with Right Foot pain that worsened over the last 2 days. Patient states that his leg started swelling 3 days ago and that he was not able to walk on it and put any pressure on it since yesterday. Patient recently had a 2nd Right Toe Amputation on March 6 at [facility] in [place] and recently saw Dr. Erlinda Hong at [facility] 07/06/16. States that he ran out of his pain meds and that toe and foot worsened over the last two days and now the pain is unbearable. No nausea, vomiting, chest pain or SOB. [facility] was called to admit patient for Cellulitis and Suspected Osteomyelitis. Consulted Orthopedics Dr. Marlou Sa who is Awaiting MRI to evaluate foot for further evaluation. This is still to be done as patient not able to sit still to do examination and was brought down today and unable to get diagnostic images.   Assessment & Plan:   Principal Problem:   Osteomyelitis ([place]) Active Problems:   Hypokalemia   Pain and swelling of right lower leg   Alcohol use   Polysubstance abuse   Tobacco use disorder   Cellulitis and abscess of toe of right foot  Right Foot Second Toe Ulceration and Cellulitis with Concern for Right Foot Osteomyelitis  -Failed Outpatient Treatment with Keflex, Clindamycin, and Doxycycline and had operation on March 6th -Admitted to Med Telemetry -C/w Broad Spectrum Abx with IV Vanc and IV Cefepime with Pharmacy to dose -Foot X-Ray showed Prior amputation of RIGHT second toe through the proximal phalanx with poor definition of the osseous stump, could represent resorption secondary to resection but osteomyelitis  is not excluded. -Will obtain Right foot MRI ordered and pending to be done if patient can sit still  -Pulse able to be found by doppler -Foot and Leg extremely swollen so will obtain Vascular Duplex to r/o DVT -Furosemide 40 mg x1 for significant Leg swelling (2+ Pitting Edema on Right) -> Improved; Will give another dose of IV 20 mg of Lasix -Patient was bolused 1 liter of Fluid in the ED -WBC went from 13.7 -> 14.8 -> 13.1; Blood Cx Sent and show NGTD at 2 days -Patient Remains Febrile, and Tachycardic -Repeat CBC in AM -Consulted Orthopedics Dr. Marlou Sa (Patient has seen [facility]) - Saw Orthopedics Dr. Erlinda Hong on 07/06/16 and was given Doxycycline and Ibuprofen; Awaiting for MRI to be done to further evaluate but patient can't sit still -Pain control with Tramadol 50 mg po q6hprn and Morphine 2 mg IV q4hprn; Added Oxycodone-Acetaminophen 1 tab po q8hprn -Wound Care Nurse Consultation appreciated  Hypokalemia/Hyponatremia, improving -Was bolused 1 Liter in ED -Replete K+ as it was 3.0 and now 3.6; Low Sodium possible from [facility] and improving to 133 -Repeat CMP in AM as patient was given dose of lasix for leg swelling  Right Leg Swelling, improved -Check Venous Doppler ordered and still pending -Dose of 1x IV Lasix and improved; Will order 1 more dose of IV lasix of 20 mg  -Continue to Monitor  EtOH Use Concern for Withdrawal of Alcohol and Opiates -Patient states he drinks at leas 2 beers Daily -CIWA Protocol with Lorazepam -C/w MVI, Folic  Acid and Thiamine -C/w Percocet q8hprn and Morphine q4hprn  Tobacco Abuse -Nicotine Patch 14 mg TD -Smoking Cessation Counseling Given  Hx of Polysubstance Abuse -Checked UDS and was positive for Opiates -Has Hx of Cocaine Abuse and states he did smoked Crack Cocaine 2 weeks ago.   Accelerated Hypertension -Likely from Pain and Agitation and mild Fever -Will Add Amlodipine 10 mg po Daily -IV Hydralazine 10 mg q4hprn for  SBP >180 or DBP >100 -Continue Pain Control and Acetaminophen for Fever  DVT prophylaxis: Lovenox 40 mg sq Code Status: FULL CODE Family Communication: No Family present at Disposition Plan: Pending PT Evaluation and improvement of clinical symptoms; OT Recommending SNF  Consultants:   Orthopedic Surgery Dr. Marlou Sa    Procedures: MRI of Right Foot still pending   Antimicrobials:  Anti-infectives    Start     Dose/Rate Route Frequency Ordered Stop   07/10/16 2200  ceFEPIme (MAXIPIME) 1 g in dextrose 5 % 50 mL IVPB     1 g 100 mL/hr over 30 Minutes Intravenous Every 8 hours 07/10/16 1830     07/10/16 1830  vancomycin (VANCOCIN) 1,500 mg in sodium chloride 0.9 % 500 mL IVPB     1,500 mg 250 mL/hr over 120 Minutes Intravenous Every 12 hours 07/10/16 1826     07/10/16 1700  cefTRIAXone (ROCEPHIN) 2 g in dextrose 5 % 50 mL IVPB  Status:  Discontinued     2 g 100 mL/hr over 30 Minutes Intravenous Every 24 hours 07/10/16 1655 07/10/16 1826   07/10/16 1700  metroNIDAZOLE (FLAGYL) tablet 500 mg  Status:  Discontinued     500 mg Oral Every 8 hours 07/10/16 1655 07/10/16 1826     Subjective: Seen and examined and was getting out of bed agitated. Foot pain improved but patient still was not able to sit still. No Nausea or vomiting. No other concerns or complaints at this time. Discussed with patient if he could sit still for MRI and he stated he could unfortunately when he went down for procedure was not able too because he complained of too much pain. Was also wanting to go outside.   Objective: Vitals:   07/11/16 1157 07/11/16 2139 07/12/16 0522 07/12/16 1437  BP: (!) 173/91 (!) 183/91 (!) 180/102 (!) 164/84  Pulse: (!) 112 (!) 105 (!) 117 (!) 106  Resp: 20 18 16 16   Temp: (!) 100.5 F (38.1 C) 99.5 F (37.5 C) 100.1 F (37.8 C) 100.2 F (37.9 C)  TempSrc: Oral Oral Oral Oral  SpO2: 97% 96% 96% 95%  Weight:   76 kg (167 lb 8.8 oz)   Height:        Intake/Output Summary (Last 24  hours) at 07/12/16 2044 Last data filed at 07/12/16 1700  Gross per 24 hour  Intake             1250 ml  Output              900 ml  Net              350 ml   Filed Weights   07/10/16 1908 07/11/16 0447 07/12/16 0522  Weight: 74.5 kg (164 lb 3.9 oz) 74.6 kg (164 lb 7.4 oz) 76 kg (167 lb 8.8 oz)   Examination: Physical Exam:  Constitutional: Thin AAM appears calmer today but still in pain Eyes: Lids and conjunctivae normal, sclerae anicteric  ENMT: External Ears, Nose appear normal. Grossly normal hearing Neck: Appears normal, supple, no cervical  masses, normal ROM, no appreciable thyromegaly, no JVD Respiratory: Clear to auscultation bilaterally, no wheezing, rales, rhonchi or crackles. Normal respiratory effort and patient is not tachypenic. No accessory muscle use.  Cardiovascular: Slightly tachycardic, no murmurs / rubs / gallops. S1 and S2 auscultated. Trace1+ Right Leg Lower Extremity Edema improved warmth and some erythema but still significant Pedal Edema; Difficult to palpate Right pedal pulse because of swelling  Abdomen: Soft, non-tender, non-distended. No masses palpated. No appreciable hepatosplenomegaly. Bowel sounds positive.  GU: Deferred. Musculoskeletal: No clubbing / cyanosis of digits/nails. Right toe ulceration with warmth and erythema and some necrosis. Skin: Right 2nd toe ulceration and pedal swelling Neurologic: CN 2-12 grossly intact with no focal deficits.  Romberg sign cerebellar reflexes not assessed.  Psychiatric: Normal judgment and insight. Alert and awake. Agitated and Anxious mood and inappropriate affect.   Labs on Admission: I have personally reviewed following labs and imaging studies  Data Reviewed: I have personally reviewed following labs and imaging studies  CBC:  Recent Labs Lab 07/10/16 1600 07/11/16 0346 07/12/16 0450  WBC 13.7* 14.8* 13.1*  NEUTROABS 10.0* 11.7* 9.2*  HGB 10.9* 12.4* 11.0*  HCT 30.9* 35.2* 31.0*  MCV 87.0 87.6 87.8   PLT 417* 523* 503   Basic Metabolic Panel:  Recent Labs Lab 07/10/16 1600 07/11/16 0346 07/12/16 0450  NA 128* 132* 133*  K 3.0* 3.5 3.6  CL 92* 94* 97*  CO2 23 26 28   GLUCOSE 79 73 107*  BUN 10 10 9   CREATININE 0.69 0.73 0.62  CALCIUM 9.3 9.3 8.9  MG  --  1.8 1.8  PHOS  --  3.7 2.6   GFR: Estimated Creatinine Clearance: 91.4 mL/min (by C-G formula based on SCr of 0.62 mg/dL). Liver Function Tests:  Recent Labs Lab 07/11/16 0346 07/12/16 0450  AST 26 24  ALT 20 18  ALKPHOS 71 61  BILITOT 1.0 0.7  PROT 8.1 7.0  ALBUMIN 3.3* 2.9*   No results for input(s): LIPASE, AMYLASE in the last 168 hours. No results for input(s): AMMONIA in the last 168 hours. Coagulation Profile: No results for input(s): INR, PROTIME in the last 168 hours. Cardiac Enzymes: No results for input(s): CKTOTAL, CKMB, CKMBINDEX, TROPONINI in the last 168 hours. BNP (last 3 results) No results for input(s): PROBNP in the last 8760 hours. HbA1C:  Recent Labs  07/11/16 0346  HGBA1C 6.2*   CBG:  Recent Labs Lab 07/11/16 0847 07/12/16 0732  GLUCAP 86 125*   Lipid Profile: No results for input(s): CHOL, HDL, LDLCALC, TRIG, CHOLHDL, LDLDIRECT in the last 72 hours. Thyroid Function Tests:  Recent Labs  07/11/16 0346  TSH 0.615   Anemia Panel: No results for input(s): VITAMINB12, FOLATE, FERRITIN, TIBC, IRON, RETICCTPCT in the last 72 hours. Sepsis Labs: No results for input(s): PROCALCITON, LATICACIDVEN in the last 168 hours.  Recent Results (from the past 240 hour(s))  Blood Cultures x 2 sites     Status: None (Preliminary result)   Collection Time: 07/10/16  3:50 PM  Result Value Ref Range Status   Specimen Description BLOOD RIGHT ANTECUBITAL  Final   Special Requests BOTTLES DRAWN AEROBIC AND ANAEROBIC 10CC  Final   Culture   Final    NO GROWTH 2 DAYS Performed at [facility], [address] [place], [place] [address]    Report Status PENDING  Incomplete  Blood  Cultures x 2 sites     Status: None (Preliminary result)   Collection Time: 07/10/16  3:51  PM  Result Value Ref Range Status   Specimen Description BLOOD LEFT FOREARM  Final   Special Requests BOTTLES DRAWN AEROBIC AND ANAEROBIC 5ML  Final   Culture   Final    NO GROWTH 2 DAYS Performed at [facility], [address] [place], [place] [address]    Report Status PENDING  Incomplete  Urine culture     Status: None   Collection Time: 07/10/16  7:15 PM  Result Value Ref Range Status   Specimen Description URINE, CLEAN CATCH  Final   Special Requests NONE  Final   Culture   Final    NO GROWTH Performed at [facility], [address], [place],  [address]    Report Status 07/12/2016 FINAL  Final     Radiology Studies: No results found. Scheduled Meds: . ceFEPime (MAXIPIME) IV  1 g Intravenous Q8H  . cholecalciferol  1,000 Units Oral BID  . enoxaparin (LOVENOX) injection  40 mg Subcutaneous Q24H  . folic acid  1 mg Oral Daily  . multivitamin with minerals  1 tablet Oral Daily  . nicotine  14 mg Transdermal Daily  . polyethylene glycol  17 g Oral BID  . senna-docusate  1 tablet Oral BID  . thiamine  100 mg Oral Daily   Or  . thiamine  100 mg Intravenous Daily  . vancomycin  1,500 mg Intravenous Q12H   Continuous Infusions:   LOS: 2 days   Kerney Elbe, DO [facility] Pager [phone]  If 7PM-7AM, please contact night-coverage [email or web address] Password [facility] 07/12/2016, 8:44 PM

## 2016-07-12 NOTE — Progress Notes (Signed)
Pt was brought down to MRI for the second time in the last two days. Pt is still unable to hold still for his scan. Pt was placed in the bore of the scanner. The entire time pt was in MRI he was moaning and yelling. Pt in severe pain. Unable to get any diagnostic images.

## 2016-07-13 ENCOUNTER — Inpatient Hospital Stay (HOSPITAL_COMMUNITY): Payer: Medicare Other

## 2016-07-13 DIAGNOSIS — L0390 Cellulitis, unspecified: Secondary | ICD-10-CM

## 2016-07-13 LAB — CBC WITH DIFFERENTIAL/PLATELET
Basophils Absolute: 0 10*3/uL (ref 0.0–0.1)
Basophils Relative: 0 %
EOS ABS: 0.1 10*3/uL (ref 0.0–0.7)
Eosinophils Relative: 1 %
HCT: 32.7 % — ABNORMAL LOW (ref 39.0–52.0)
HEMOGLOBIN: 11.4 g/dL — AB (ref 13.0–17.0)
LYMPHS ABS: 2.1 10*3/uL (ref 0.7–4.0)
LYMPHS PCT: 17 %
MCH: 30.6 pg (ref 26.0–34.0)
MCHC: 34.9 g/dL (ref 30.0–36.0)
MCV: 87.7 fL (ref 78.0–100.0)
MONOS PCT: 11 %
Monocytes Absolute: 1.4 10*3/uL — ABNORMAL HIGH (ref 0.1–1.0)
NEUTROS PCT: 71 %
Neutro Abs: 9 10*3/uL — ABNORMAL HIGH (ref 1.7–7.7)
Platelets: 424 10*3/uL — ABNORMAL HIGH (ref 150–400)
RBC: 3.73 MIL/uL — ABNORMAL LOW (ref 4.22–5.81)
RDW: 13.5 % (ref 11.5–15.5)
WBC: 12.6 10*3/uL — ABNORMAL HIGH (ref 4.0–10.5)

## 2016-07-13 LAB — COMPREHENSIVE METABOLIC PANEL
ALT: 22 U/L (ref 17–63)
ANION GAP: 10 (ref 5–15)
AST: 23 U/L (ref 15–41)
Albumin: 3 g/dL — ABNORMAL LOW (ref 3.5–5.0)
Alkaline Phosphatase: 63 U/L (ref 38–126)
BILIRUBIN TOTAL: 0.6 mg/dL (ref 0.3–1.2)
BUN: 8 mg/dL (ref 6–20)
CO2: 26 mmol/L (ref 22–32)
Calcium: 9.1 mg/dL (ref 8.9–10.3)
Chloride: 93 mmol/L — ABNORMAL LOW (ref 101–111)
Creatinine, Ser: 0.58 mg/dL — ABNORMAL LOW (ref 0.61–1.24)
Glucose, Bld: 112 mg/dL — ABNORMAL HIGH (ref 65–99)
Potassium: 3.7 mmol/L (ref 3.5–5.1)
SODIUM: 129 mmol/L — AB (ref 135–145)
TOTAL PROTEIN: 7.7 g/dL (ref 6.5–8.1)

## 2016-07-13 LAB — CREATININE, SERUM
Creatinine, Ser: 0.64 mg/dL (ref 0.61–1.24)
GFR calc non Af Amer: >60 mL/min (ref >60)

## 2016-07-13 LAB — VANCOMYCIN, TROUGH: VANCOMYCIN TR: 13 ug/mL — AB (ref 15–20)

## 2016-07-13 LAB — MAGNESIUM: MAGNESIUM: 1.8 mg/dL (ref 1.7–2.4)

## 2016-07-13 LAB — GLUCOSE, CAPILLARY: Glucose-Capillary: 120 mg/dL — ABNORMAL HIGH (ref 65–99)

## 2016-07-13 LAB — PHOSPHORUS: PHOSPHORUS: 3.6 mg/dL (ref 2.5–4.6)

## 2016-07-13 MED ORDER — MORPHINE SULFATE (PF) 4 MG/ML IV SOLN
2.0000 mg | Freq: Once | INTRAVENOUS | Status: AC
  Administered 2016-07-13: 2 mg via INTRAVENOUS

## 2016-07-13 MED ORDER — VANCOMYCIN HCL IN DEXTROSE 1-5 GM/200ML-% IV SOLN
1000.0000 mg | Freq: Three times a day (TID) | INTRAVENOUS | Status: DC
  Administered 2016-07-14 (×3): 1000 mg via INTRAVENOUS
  Filled 2016-07-13 (×3): qty 200

## 2016-07-13 MED ORDER — DEXTROSE 5 % IV SOLN
1.0000 g | Freq: Three times a day (TID) | INTRAVENOUS | Status: AC
  Administered 2016-07-13: 1 g via INTRAVENOUS
  Filled 2016-07-13: qty 1

## 2016-07-13 MED ORDER — LORAZEPAM 2 MG/ML IJ SOLN: 2.0000 mg | Freq: Once | INTRAMUSCULAR | Status: DC

## 2016-07-13 MED ORDER — SODIUM CHLORIDE 0.9 % IV SOLN
INTRAVENOUS | Status: DC
  Administered 2016-07-13 – 2016-07-14 (×2): via INTRAVENOUS

## 2016-07-13 MED ORDER — DEXTROSE 5 % IV SOLN
2.0000 g | Freq: Two times a day (BID) | INTRAVENOUS | Status: DC
  Administered 2016-07-13 – 2016-07-17 (×8): 2 g via INTRAVENOUS
  Filled 2016-07-13 (×9): qty 2

## 2016-07-13 MED ORDER — LORAZEPAM 2 MG/ML IJ SOLN
2.0000 mg | Freq: Once | INTRAMUSCULAR | Status: AC | PRN
  Administered 2016-07-15: 2 mg via INTRAVENOUS
  Filled 2016-07-13: qty 1

## 2016-07-13 NOTE — Progress Notes (Signed)
Pharmacy Antibiotic Note  Calvin Mckee is a 66 y.o. male admitted on 07/10/2016 with right foot ulceration and cellulitis with concern for osteomyelitis.  Patient failed outpatient keflex, clinda, doxycycline therapies.  Pharmacy has been consulted for vancomycin and cefepime dosing.    Day #4 Vancomycin and Cefepime.  MRI to rule out OM remains pending.  SCr stable.    Vancomycin trough level = 70mcg/ml (goal 15-20) so will adjust regimen after next dose  Plan: - Give Vancomycin 1500mg  scheduled dose now then change regimen to 1gm IV q8h.   - Cefepime 2g IV q12h.  Height: 5\' 8"  (172.7 cm) Weight: 161 lb 13.1 oz (73.4 kg) IBW/kg (Calculated) : 68.4  Temp (24hrs), Avg:99.7 F (37.6 C), Min:98.4 F (36.9 C), Max:101 F (38.3 C)   Recent Labs Lab 07/10/16 1600 07/11/16 0346 07/12/16 0450 07/13/16 0552 07/13/16 1721  WBC 13.7* 14.8* 13.1* 12.6*  --   CREATININE 0.69 0.73 0.62 0.58*  0.64  --   VANCOTROUGH  --   --   --   --  13*    Estimated Creatinine Clearance: 91.4 mL/min (by C-G formula based on SCr of 0.64 mg/dL).    No Known Allergies  Antimicrobials this admission:  3/31 CTX x 1 3/31 Flagyl x 1 3/31 Cefepime >>  4/1 Vancomycin >>   Dose adjustments this admission:  4/3 1700 VT: 13 mcg/ml on 1500 mg q12h ==> change to 1gm q8h  Microbiology results:  3/31 BCx: ngtd 3/31 UCx: NGF  Thank you for allowing pharmacy to be a part of this patient's care.  Everette Rank, PharmD 07/13/2016 7:03 PM

## 2016-07-13 NOTE — Progress Notes (Signed)
Taking over care of patient agree previous RN assessment. Denies any needs at this time. Will continue to monitor.

## 2016-07-13 NOTE — Progress Notes (Signed)
Pharmacy Antibiotic Note  Calvin Mckee is a 66 y.o. male admitted on 07/10/2016 with right foot ulceration and cellulitis with concern for osteomyelitis.  Patient failed outpatient keflex, clinda, doxycycline therapies.  Pharmacy has been consulted for vancomycin and cefepime dosing.    Day #4 Vancomycin and Cefepime.  MRI to rule out OM remains pending.  SCr stable.    Plan: Continue Vancomycin 1500 mg IV q12h. Check VT this evening. Cefepime 2g IV q12h.  Height: 5\' 8"  (172.7 cm) Weight: 161 lb 13.1 oz (73.4 kg) IBW/kg (Calculated) : 68.4  Temp (24hrs), Avg:99.8 F (37.7 C), Min:98.6 F (37 C), Max:100.9 F (38.3 C)   Recent Labs Lab 07/10/16 1600 07/11/16 0346 07/12/16 0450 07/13/16 0552  WBC 13.7* 14.8* 13.1* 12.6*  CREATININE 0.69 0.73 0.62 0.58*  0.64    Estimated Creatinine Clearance: 91.4 mL/min (by C-G formula based on SCr of 0.64 mg/dL).    No Known Allergies  Antimicrobials this admission:  3/31 CTX x 1 3/31 Flagyl x 1 3/31 Cefepime >>  4/1 Vancomycin >>   Dose adjustments this admission:  4/3 1700 VT: ____ on 1500 mg q12h  Microbiology results:  3/31 BCx: ngtd 3/31 UCx: NGF  Thank you for allowing pharmacy to be a part of this patient's care.  Calvin Mckee 07/13/2016 12:51 PM

## 2016-07-13 NOTE — Progress Notes (Signed)
PT Cancellation Note  Patient Details Name: COLBURN ASPER MRN: [id number] DOB: February 06, 1951   Cancelled Treatment:    Reason Eval/Treat Not Completed: Pain limiting ability to participate (paint moaning. has a Air cabin crew. MRI pending to R/O osteo but unable to be completed. will check back another time for patient's ability to participate.)   Marcelino Freestone PT [phone]  07/13/2016, 8:23 AM

## 2016-07-13 NOTE — Progress Notes (Signed)
PROGRESS NOTE    [name]  QIW:[id number] DOB: May 05, 1950 DOA: 07/10/2016 PCP: Pcp Not In System  Brief Narrative:  Calvin Mckee is a 66 y.o. male with medical history significant of Recent Right 2nd toe Amputation (been on Abx with Keflex, Clindamycin, and Doxycycline), Tobacco Abuse, EtOH use, Polysubstance Abuse including recent Tobacco Abuse who presented with Right Foot pain that worsened over the last 2 days. Patient states that his leg started swelling 3 days ago and that he was not able to walk on it and put any pressure on it since yesterday. Patient recently had a 2nd Right Toe Amputation on March 6 at [facility] in [place] and recently saw Dr. Erlinda Hong at [facility] 07/06/16. States that he ran out of his pain meds and that toe and foot worsened over the last two days and now the pain is unbearable. No nausea, vomiting, chest pain or SOB. TRH was called to admit patient for Cellulitis and Suspected Osteomyelitis. Consulted Orthopedics Dr. Marlou Sa who is Awaiting MRI to evaluate foot for further evaluation. This is still to be done as patient not able to sit still to do examination and was brought down yesterday and unable to get diagnostic images. Patient unable to sit still today during examination and states pain is unbearable and was not able to be evaluated by PT due to pain. Given additional Morphine today and will likely need anxiolytic if MRI is attempted again.   Assessment & Plan:   Principal Problem:   Osteomyelitis ([place]) Active Problems:   Hypokalemia   Pain and swelling of right lower leg   Alcohol use   Polysubstance abuse   Tobacco use disorder   Cellulitis and abscess of toe of right foot  Right Foot Second Toe Ulceration and Cellulitis with Concern for Right Foot Osteomyelitis  -Failed Outpatient Treatment with Keflex, Clindamycin, and Doxycycline and had operation on March 6th -Admitted to Med Telemetry -Patient was Febrile today at 101 and  Tachycardic and Tachypenic; WBC Stable -C/w Broad Spectrum Abx with IV Vanc and IV Cefepime with Pharmacy to dose -Foot X-Ray showed Prior amputation of RIGHT second toe through the proximal phalanx with poor definition of the osseous stump, could represent resorption secondary to resection but osteomyelitis is not excluded. -Will obtain Right foot MRI; ordered and pending to be done if patient can sit still  -Pulse able to be found by doppler -Foot and Leg extremely swollen so obtained Vascular Duplex to r/o DVT and showed that the RLE is Negative for DIVT and no evidence of Right Bakers Cyst but there was an incidental finding of a hetrerogenous area of the Right Groin suggestive of a possible Prominent inguinal Lymph node -[place] improved after IV Lasix (given 20 mg yesterday and 40 mg day before) -Patient was bolused 1 liter of Fluid in the ED -WBC went from 13.7 -> 14.8 -> 13.1 -> 12.6; Blood Cx Sent and show NGTD at 2 days -Patient Remains Febrile, and Tachycardic -Repeat CBC in AM -Consulted Orthopedics Dr. Marlou Sa (Patient has seen [facility]) - Saw Orthopedics Dr. Erlinda Hong on 07/06/16 and was given Doxycycline and Ibuprofen; Awaiting for MRI to be done to further evaluate but patient can't sit still -Pain control with Tramadol 50 mg po q6hprn and Morphine 2 mg IV q4hprn; Added Oxycodone-Acetaminophen 1 tab po q8hprn -Wound Care Nurse Consultation appreciated  Hypokalemia/Hyponatremia, improving -Was bolused 1 Liter in ED -Replete K+ as it was 3.0 and now 3.7; Low Sodium possible from [facility]  [place] and was now 129 -Repeat CMP in AM   Right Leg Swelling, improved -Checked Venous Doppler and Negative -S/p total of 60 mg of IV Lasix -Continue to Monitor  EtOH Use Concern for Withdrawal of Alcohol and Opiates -Patient states he drinks at leas 2 beers Daily -CIWA Protocol with Lorazepam -C/w MVI, Folic Acid and Thiamine -C/w Percocet q8hprn and Morphine 2 mg q4hprn  Tobacco  Abuse -Nicotine Patch 14 mg TD -Smoking Cessation Counseling Given  Hx of Polysubstance Abuse -Checked UDS and was positive for Opiates -Has Hx of Cocaine Abuse and states he did smoked Crack Cocaine 2 weeks ago.   Accelerated Hypertension -Likely from Pain and Agitation and mild Fever -C/w Amlodipine 10 mg po Daily -IV Hydralazine 10 mg q4hprn for SBP >180 or DBP >100 -Continue Pain Control and Acetaminophen for Fever  SIRS likely from Cellulitis r/o Osteomyelitis in the setting of Foot Pain -As above -Was tachycardic at 115, Tachyphenic at 22, Febrile at 101 and WBC with 12.6 -Will start low dose IVF to Rehydrate -Possible Pain an Agitation -? Opiate vs EtOH Withdrawal -?Psychiatric Component; May want to discuss with Psych in AM  DVT prophylaxis: Lovenox 40 mg sq Code Status: FULL CODE Family Communication: No Family present at Disposition Plan: Pending PT Evaluation as patient was in too much pain; Needs improvement of clinical symptoms; OT Recommending SNF;  Consultants:   Orthopedic Surgery Dr. Marlou Sa    Procedures: MRI of Right Foot still pending   Antimicrobials:  Anti-infectives    Start     Dose/Rate Route Frequency Ordered Stop   07/13/16 2200  ceFEPIme (MAXIPIME) 2 g in dextrose 5 % 50 mL IVPB     2 g 100 mL/hr over 30 Minutes Intravenous Every 12 hours 07/13/16 1258     07/13/16 1400  ceFEPIme (MAXIPIME) 1 g in dextrose 5 % 50 mL IVPB     1 g 100 mL/hr over 30 Minutes Intravenous Every 8 hours 07/13/16 1258 07/13/16 1359   07/10/16 2200  ceFEPIme (MAXIPIME) 1 g in dextrose 5 % 50 mL IVPB  Status:  Discontinued     1 g 100 mL/hr over 30 Minutes Intravenous Every 8 hours 07/10/16 1830 07/13/16 1258   07/10/16 1830  vancomycin (VANCOCIN) 1,500 mg in sodium chloride 0.9 % 500 mL IVPB     1,500 mg 250 mL/hr over 120 Minutes Intravenous Every 12 hours 07/10/16 1826     07/10/16 1700  cefTRIAXone (ROCEPHIN) 2 g in dextrose 5 % 50 mL IVPB  Status:  Discontinued      2 g 100 mL/hr over 30 Minutes Intravenous Every 24 hours 07/10/16 1655 07/10/16 1826   07/10/16 1700  metroNIDAZOLE (FLAGYL) tablet 500 mg  Status:  Discontinued     500 mg Oral Every 8 hours 07/10/16 1655 07/10/16 1826     Subjective: Seen and examined and was awoken from his sleep and then started complaining of foot pain and screaming. He was unable to sit still and was moving all over the place. No other concerns or complaints but thinks foot is less swollen.   Objective: Vitals:   07/13/16 0455 07/13/16 0514 07/13/16 1417 07/13/16 1553  BP:  (!) 158/84 (!) 164/84   Pulse:  (!) 105 (!) 115   Resp:  18 (!) 22   Temp:  99.4 F (37.4 C) (!) 101 F (38.3 C) 98.4 F (36.9 C)  TempSrc:  Oral Oral Oral  SpO2:  96% 95%   Weight: 73.4  kg (161 lb 13.1 oz)     Height:        Intake/Output Summary (Last 24 hours) at 07/13/16 1725 Last data filed at 07/13/16 1329  Gross per 24 hour  Intake              480 ml  Output             2425 ml  Net            -1945 ml   Filed Weights   07/11/16 0447 07/12/16 0522 07/13/16 0455  Weight: 74.6 kg (164 lb 7.4 oz) 76 kg (167 lb 8.8 oz) 73.4 kg (161 lb 13.1 oz)   Examination: Physical Exam:  Constitutional: Thin AAM who is unable to sit still because he is  in pain Eyes: Lids and conjunctivae normal, sclerae anicteric  ENMT: External Ears, Nose appear normal. Grossly normal hearing Neck: Appears normal, supple, no cervical masses, normal ROM, no appreciable thyromegaly, no JVD Respiratory: Clear to auscultation bilaterally, no wheezing, rales, rhonchi or crackles. Normal respiratory effort and patient is not tachypenic. No accessory muscle use.  Cardiovascular: Slightly tachycardic, no murmurs / rubs / gallops. S1 and S2 auscultated. Trace Right Leg Lower Extremity Edema improved warmth and some erythema but still has some Pedal Edema; Difficult to palpate Right pedal pulse because of swelling  Abdomen: Soft, non-tender, non-distended. No  masses palpated. No appreciable hepatosplenomegaly. Bowel sounds positive.  GU: Deferred. Musculoskeletal: No clubbing / cyanosis of digits/nails. Right toe ulceration less warmth and erythema Skin: Right 2nd toe ulceration and pedal swelling Neurologic: CN 2-12 grossly intact with no focal deficits.  Romberg sign cerebellar reflexes not assessed.  Psychiatric: Normal judgment and insight. Alert and awake. Agitated and Anxious mood and inappropriate affect.   Labs on Admission: I have personally reviewed following labs and imaging studies  Data Reviewed: I have personally reviewed following labs and imaging studies  CBC:  Recent Labs Lab 07/10/16 1600 07/11/16 0346 07/12/16 0450 07/13/16 0552  WBC 13.7* 14.8* 13.1* 12.6*  NEUTROABS 10.0* 11.7* 9.2* 9.0*  HGB 10.9* 12.4* 11.0* 11.4*  HCT 30.9* 35.2* 31.0* 32.7*  MCV 87.0 87.6 87.8 87.7  PLT 417* 523* 389 378*   Basic Metabolic Panel:  Recent Labs Lab 07/10/16 1600 07/11/16 0346 07/12/16 0450 07/13/16 0552  NA 128* 132* 133* 129*  K 3.0* 3.5 3.6 3.7  CL 92* 94* 97* 93*  CO2 23 26 28 26   GLUCOSE 79 73 107* 112*  BUN 10 10 9 8   CREATININE 0.69 0.73 0.62 0.58*  0.64  CALCIUM 9.3 9.3 8.9 9.1  MG  --  1.8 1.8 1.8  PHOS  --  3.7 2.6 3.6   GFR: Estimated Creatinine Clearance: 91.4 mL/min (by C-G formula based on SCr of 0.64 mg/dL). Liver Function Tests:  Recent Labs Lab 07/11/16 0346 07/12/16 0450 07/13/16 0552  AST 26 24 23   ALT 20 18 22   ALKPHOS 71 61 63  BILITOT 1.0 0.7 0.6  PROT 8.1 7.0 7.7  ALBUMIN 3.3* 2.9* 3.0*   No results for input(s): LIPASE, AMYLASE in the last 168 hours. No results for input(s): AMMONIA in the last 168 hours. Coagulation Profile: No results for input(s): INR, PROTIME in the last 168 hours. Cardiac Enzymes: No results for input(s): CKTOTAL, CKMB, CKMBINDEX, TROPONINI in the last 168 hours. BNP (last 3 results) No results for input(s): PROBNP in the last 8760  hours. HbA1C:  Recent Labs  07/11/16 0346  HGBA1C 6.2*  CBG:  Recent Labs Lab 07/11/16 0847 07/12/16 0732 07/13/16 0725  GLUCAP 86 125* 120*   Lipid Profile: No results for input(s): CHOL, HDL, LDLCALC, TRIG, CHOLHDL, LDLDIRECT in the last 72 hours. Thyroid Function Tests:  Recent Labs  07/11/16 0346  TSH 0.615   Anemia Panel: No results for input(s): VITAMINB12, FOLATE, FERRITIN, TIBC, IRON, RETICCTPCT in the last 72 hours. Sepsis Labs: No results for input(s): PROCALCITON, LATICACIDVEN in the last 168 hours.  Recent Results (from the past 240 hour(s))  Blood Cultures x 2 sites     Status: None (Preliminary result)   Collection Time: 07/10/16  3:50 PM  Result Value Ref Range Status   Specimen Description BLOOD RIGHT ANTECUBITAL  Final   Special Requests BOTTLES DRAWN AEROBIC AND ANAEROBIC 10CC  Final   Culture   Final    NO GROWTH 3 DAYS Performed at [facility], [address], [place], [place] [address]    Report Status PENDING  Incomplete  Blood Cultures x 2 sites     Status: None (Preliminary result)   Collection Time: 07/10/16  3:51 PM  Result Value Ref Range Status   Specimen Description BLOOD LEFT FOREARM  Final   Special Requests BOTTLES DRAWN AEROBIC AND ANAEROBIC 5ML  Final   Culture   Final    NO GROWTH 3 DAYS Performed at [facility], [address], [place], [place] [address]    Report Status PENDING  Incomplete  Urine culture     Status: None   Collection Time: 07/10/16  7:15 PM  Result Value Ref Range Status   Specimen Description URINE, CLEAN CATCH  Final   Special Requests NONE  Final   Culture   Final    NO GROWTH Performed at [facility], [address], [place], [place] [address]    Report Status 07/12/2016 FINAL  Final     Radiology Studies: No results found. Scheduled Meds: . amLODipine  10 mg Oral Daily  . ceFEPime (MAXIPIME) IV  2 g Intravenous Q12H  . cholecalciferol  1,000 Units Oral BID  .  enoxaparin (LOVENOX) injection  40 mg Subcutaneous Q24H  . folic acid  1 mg Oral Daily  . LORazepam  2 mg Intravenous Once  . multivitamin with minerals  1 tablet Oral Daily  . nicotine  14 mg Transdermal Daily  . polyethylene glycol  17 g Oral BID  . senna-docusate  1 tablet Oral BID  . thiamine  100 mg Oral Daily   Or  . thiamine  100 mg Intravenous Daily  . vancomycin  1,500 mg Intravenous Q12H   Continuous Infusions:   LOS: 3 days   Kerney Elbe, DO [facility] Pager [phone]  If 7PM-7AM, please contact night-coverage [email or web address] Password [email or web address] 07/13/2016, 5:25 PM

## 2016-07-13 NOTE — Plan of Care (Signed)
Problem: Safety: Goal: Ability to remain free from injury will improve Outcome: Not Progressing Patient continues to be impulsive and getting out of bed unassisted

## 2016-07-13 NOTE — Progress Notes (Addendum)
*  Preliminary Results* Right lower extremity venous duplex completed. Right lower extremity is negative for deep vein thrombosis. There is no evidence of right Baker's cyst.  Incidental finding: there is a heterogenous area of the right groin, suggestive of a possible prominent inguinal lymph node.  07/13/2016 9:17 AM  Maudry Mayhew, BS, RVT, RDCS, RDMS

## 2016-07-14 ENCOUNTER — Inpatient Hospital Stay (HOSPITAL_COMMUNITY): Payer: Medicare Other

## 2016-07-14 LAB — COMPREHENSIVE METABOLIC PANEL
ALT: 21 U/L (ref 17–63)
ANION GAP: 8 (ref 5–15)
AST: 19 U/L (ref 15–41)
Albumin: 2.8 g/dL — ABNORMAL LOW (ref 3.5–5.0)
Alkaline Phosphatase: 53 U/L (ref 38–126)
BUN: 7 mg/dL (ref 6–20)
CALCIUM: 8.6 mg/dL — AB (ref 8.9–10.3)
CO2: 28 mmol/L (ref 22–32)
CREATININE: 0.64 mg/dL (ref 0.61–1.24)
Chloride: 93 mmol/L — ABNORMAL LOW (ref 101–111)
Glucose, Bld: 106 mg/dL — ABNORMAL HIGH (ref 65–99)
Potassium: 3.6 mmol/L (ref 3.5–5.1)
Sodium: 129 mmol/L — ABNORMAL LOW (ref 135–145)
Total Bilirubin: 0.7 mg/dL (ref 0.3–1.2)
Total Protein: 7.5 g/dL (ref 6.5–8.1)

## 2016-07-14 LAB — PHOSPHORUS: Phosphorus: 4 mg/dL (ref 2.5–4.6)

## 2016-07-14 LAB — MAGNESIUM: MAGNESIUM: 1.9 mg/dL (ref 1.7–2.4)

## 2016-07-14 LAB — CBC WITH DIFFERENTIAL/PLATELET
BASOS ABS: 0 10*3/uL (ref 0.0–0.1)
BASOS PCT: 0 %
Eosinophils Absolute: 0.1 10*3/uL (ref 0.0–0.7)
Eosinophils Relative: 1 %
HEMATOCRIT: 28.2 % — AB (ref 39.0–52.0)
HEMOGLOBIN: 9.8 g/dL — AB (ref 13.0–17.0)
LYMPHS PCT: 18 %
Lymphs Abs: 2.2 10*3/uL (ref 0.7–4.0)
MCH: 29.4 pg (ref 26.0–34.0)
MCHC: 34.8 g/dL (ref 30.0–36.0)
MCV: 84.7 fL (ref 78.0–100.0)
Monocytes Absolute: 1.9 10*3/uL — ABNORMAL HIGH (ref 0.1–1.0)
Monocytes Relative: 15 %
NEUTROS ABS: 8 10*3/uL — AB (ref 1.7–7.7)
NEUTROS PCT: 66 %
PLATELETS: 352 10*3/uL (ref 150–400)
RBC: 3.33 MIL/uL — ABNORMAL LOW (ref 4.22–5.81)
RDW: 13.2 % (ref 11.5–15.5)
WBC: 12.2 10*3/uL — ABNORMAL HIGH (ref 4.0–10.5)

## 2016-07-14 LAB — GLUCOSE, CAPILLARY: Glucose-Capillary: 183 mg/dL — ABNORMAL HIGH (ref 65–99)

## 2016-07-14 NOTE — Care Management Note (Signed)
Case Management Note  Patient Details  Name: GIANLUCA CHHIM MRN: [id number] Date of Birth: January 08, 1951  Subjective/Objective:  66 y/o m admitted w/Osteomyelitis. For MRI R foot. From home. PT-recc SNF.  CSW following for SNF.                Action/Plan:d/c SNF   Expected Discharge Date:                  Expected Discharge Plan:  [place]  In-House Referral:  Clinical Social Work  Discharge planning Services  CM Consult  Post Acute Care Choice:    Choice offered to:     DME Arranged:    DME Agency:     HH Arranged:    [place] Agency:     Status of Service:  In process, will continue to follow  If discussed at Long Length of Stay Meetings, dates discussed:    Additional Comments:  Dessa Phi, RN 07/14/2016, 3:36 PM

## 2016-07-14 NOTE — Care Management Important Message (Signed)
Important Message  Patient Details  Name: Calvin Mckee MRN: [id number] Date of Birth: 09/13/1950   Medicare Important Message Given:  Yes    Kerin Salen 07/14/2016, 10:13 AMImportant Message  Patient Details  Name: Calvin Mckee MRN: [id number] Date of Birth: 1950/11/27   Medicare Important Message Given:  Yes    Kerin Salen 07/14/2016, 10:13 AM

## 2016-07-14 NOTE — Progress Notes (Signed)
PROGRESS NOTE    Calvin Mckee  VOZ:[id number] DOB: May 23, 1950 DOA: 07/10/2016 PCP: Pcp Not In System  Brief Narrative:  Calvin Mckee is a 66 y.o. male with medical history significant of Recent Right 2nd toe Amputation (been on Abx with Keflex, Clindamycin, and Doxycycline), Tobacco Abuse, EtOH use, Polysubstance Abuse including recent Tobacco Abuse who presented with Right Foot pain that worsened over the last 2 days. Patient states that his leg started swelling 3 days ago and that he was not able to walk on it and put any pressure on it since yesterday. Patient recently had a 2nd Right Toe Amputation on March 6 at [facility] in [place] and recently saw Dr. Erlinda Hong at [facility] 07/06/16. States that he ran out of his pain meds and that toe and foot worsened over the last two days and now the pain is unbearable. No nausea, vomiting, chest pain or SOB. TRH was called to admit patient for Cellulitis and Suspected Osteomyelitis. Consulted Orthopedics Dr. Marlou Sa who is Awaiting MRI to evaluate foot for further evaluation. This is still to be done as patient not able to sit still to do examination and was brought down yesterday and unable to get diagnostic images. Patient unable to sit still today during examination and states pain is unbearable and was not able to be evaluated by PT due to pain. Given additional Morphine today and will likely need anxiolytic if MRI is attempted again.   Assessment & Plan:   Principal Problem:   Osteomyelitis ([place]) Active Problems:   Hypokalemia   Pain and swelling of right lower leg   Alcohol use   Polysubstance abuse   Tobacco use disorder   Cellulitis and abscess of toe of right foot  Right Foot Second Toe Ulceration and Cellulitis with Concern for Right Foot Osteomyelitis /sepsis presented on admission -Failed Outpatient Treatment with Keflex, Clindamycin, and Doxycycline and had operation on March 6th -Patient was Febrile today at 101 and  Tachycardic and Tachypenic; WBC 14.8 on admission, meet sepsis criteria, blood culture no growth, urine culture no growth, cxr pending --Foot X-Ray showed Prior amputation of RIGHT second toe through the proximal phalanx with poor definition of the osseous stump, could represent resorption secondary to resection but osteomyelitis is not excluded. --palpable Pulse, -Foot and Leg extremely swollen so obtained Vascular Duplex to r/o DVT and showed that the RLE is Negative for DIVT and no evidence of Right Bakers Cyst but there was an incidental finding of a hetrerogenous area of the Right Groin suggestive of a possible Prominent inguinal Lymph node --Consulted Orthopedics Dr. Marlou Sa (Patient has seen [facility]) - Saw Orthopedics Dr. Erlinda Hong on 07/06/16 and was given Doxycycline and Ibuprofen; Awaiting for MRI to be done to further evaluate but patient can't sit still -Wound Care Nurse Consultation appreciated -C/w Broad Spectrum Abx with IV Vanc and IV Cefepime with Pharmacy to dose, S/p total of 60 mg of IV Lasix has not had Right foot MRI due to  patient cannot sit still , may need to discuss with radiology for other imaging modality including bone scan or CT instead of mri  Hypokalemia, replace, check mag  Hyponatremia,  Low Sodium possible from [employer]  -will check serum/urine osmo/sodium/am cortisol level, though not sure result will be useful since patient has received ivf and lasix - will try fluids restriction once labs obtained  Accelerated Hypertension -Likely from Pain and Agitation and mild Fever -C/w Amlodipine 10 mg po Daily, h/o cocaine use,  will need to avoid betablocker -IV Hydralazine 10 mg q4hprn for SBP >180 or DBP >100 -Continue Pain Control and Acetaminophen for Fever  EtOH Use Concern for Withdrawal of Alcohol and Opiates -Patient states he drinks at leas 2 beers Daily -CIWA Protocol with Lorazepam -C/w MVI, Folic Acid and Thiamine -C/w Percocet q8hprn and  Morphine 2 mg q4hprn   Tobacco Abuse -Nicotine Patch 14 mg TD -Smoking Cessation Counseling Given  Hx of Polysubstance Abuse -Checked UDS and was positive for Opiates -Has Hx of Cocaine Abuse and states he did smoked Crack Cocaine 2 weeks ago.     DVT prophylaxis: Lovenox 40 mg sq Code Status: FULL CODE Family Communication: No Family present at Disposition Plan: SNF;  Consultants:   Orthopedic Surgery Dr. Marlou Sa    Procedures: MRI of Right Foot still pending   Antimicrobials:  Anti-infectives    Start     Dose/Rate Route Frequency Ordered Stop   07/14/16 0500  vancomycin (VANCOCIN) IVPB 1000 mg/200 mL premix     1,000 mg 200 mL/hr over 60 Minutes Intravenous Every 8 hours 07/13/16 1907     07/13/16 2200  ceFEPIme (MAXIPIME) 2 g in dextrose 5 % 50 mL IVPB     2 g 100 mL/hr over 30 Minutes Intravenous Every 12 hours 07/13/16 1258     07/13/16 1400  ceFEPIme (MAXIPIME) 1 g in dextrose 5 % 50 mL IVPB     1 g 100 mL/hr over 30 Minutes Intravenous Every 8 hours 07/13/16 1258 07/13/16 1359   07/10/16 2200  ceFEPIme (MAXIPIME) 1 g in dextrose 5 % 50 mL IVPB  Status:  Discontinued     1 g 100 mL/hr over 30 Minutes Intravenous Every 8 hours 07/10/16 1830 07/13/16 1258   07/10/16 1830  vancomycin (VANCOCIN) 1,500 mg in sodium chloride 0.9 % 500 mL IVPB  Status:  Discontinued     1,500 mg 250 mL/hr over 120 Minutes Intravenous Every 12 hours 07/10/16 1826 07/13/16 1907   07/10/16 1700  cefTRIAXone (ROCEPHIN) 2 g in dextrose 5 % 50 mL IVPB  Status:  Discontinued     2 g 100 mL/hr over 30 Minutes Intravenous Every 24 hours 07/10/16 1655 07/10/16 1826   07/10/16 1700  metroNIDAZOLE (FLAGYL) tablet 500 mg  Status:  Discontinued     500 mg Oral Every 8 hours 07/10/16 1655 07/10/16 1826     Subjective: Right foot pain and swelling seems improving,  Last fever on 4/3 2pm, he agrees to try to lay still for MRI today He is aaox3 today, no agitation this am  Objective: Vitals:    07/14/16 0510 07/14/16 0608 07/14/16 1251 07/14/16 1500  BP: (!) 178/84 (!) 159/75 (!) 190/98 (!) 152/79  Pulse: 99  (!) 113 (!) 102  Resp: 19  20   Temp: 99.2 F (37.3 C)  99 F (37.2 C)   TempSrc: Oral  Oral   SpO2: 100%  96%   Weight: 75.1 kg (165 lb 9.1 oz)     Height:        Intake/Output Summary (Last 24 hours) at 07/14/16 1747 Last data filed at 07/14/16 1600  Gross per 24 hour  Intake          3043.75 ml  Output             1550 ml  Net          1493.75 ml   Filed Weights   07/12/16 0522 07/13/16 0455 07/14/16 0510  Weight:  76 kg (167 lb 8.8 oz) 73.4 kg (161 lb 13.1 oz) 75.1 kg (165 lb 9.1 oz)   Examination: Physical Exam:  Constitutional: NAD, aaox3 Eyes: Lids and conjunctivae normal, sclerae anicteric  ENMT: External Ears, Nose appear normal. Grossly normal hearing Neck: Appears normal, supple, no cervical masses, normal ROM, no appreciable thyromegaly, no JVD Respiratory: Clear to auscultation bilaterally, no wheezing, rales, rhonchi or crackles. Normal respiratory effort and patient is not tachypenic. No accessory muscle use.  Cardiovascular: Slightly tachycardic, no murmurs / rubs / gallops. S1 and S2 auscultated. Trace Right Leg Lower Extremity Edema improved warmth and some erythema but still has some Pedal Edema; Difficult to palpate Right pedal pulse because of swelling  Abdomen: Soft, non-tender, non-distended. No masses palpated. No appreciable hepatosplenomegaly. Bowel sounds positive.  GU: Deferred. Musculoskeletal: No clubbing / cyanosis of digits/nails. Right toe ulceration less warmth and erythema Skin: Right 2nd toe ulceration and pedal swelling Neurologic: CN 2-12 grossly intact with no focal deficits.  Romberg sign cerebellar reflexes not assessed.  Psychiatric: Normal judgment and insight. Alert and awake. Agitated and Anxious mood and inappropriate affect.   Labs on Admission: I have personally reviewed following labs and imaging studies  Data  Reviewed: I have personally reviewed following labs and imaging studies  CBC:  Recent Labs Lab 07/10/16 1600 07/11/16 0346 07/12/16 0450 07/13/16 0552 07/14/16 0548  WBC 13.7* 14.8* 13.1* 12.6* 12.2*  NEUTROABS 10.0* 11.7* 9.2* 9.0* 8.0*  HGB 10.9* 12.4* 11.0* 11.4* 9.8*  HCT 30.9* 35.2* 31.0* 32.7* 28.2*  MCV 87.0 87.6 87.8 87.7 84.7  PLT 417* 523* 389 424* 163   Basic Metabolic Panel:  Recent Labs Lab 07/10/16 1600 07/11/16 0346 07/12/16 0450 07/13/16 0552 07/14/16 0548  NA 128* 132* 133* 129* 129*  K 3.0* 3.5 3.6 3.7 3.6  CL 92* 94* 97* 93* 93*  CO2 23 26 28 26 28   GLUCOSE 79 73 107* 112* 106*  BUN 10 10 9 8 7   CREATININE 0.69 0.73 0.62 0.58*  0.64 0.64  CALCIUM 9.3 9.3 8.9 9.1 8.6*  MG  --  1.8 1.8 1.8 1.9  PHOS  --  3.7 2.6 3.6 4.0   GFR: Estimated Creatinine Clearance: 91.4 mL/min (by C-G formula based on SCr of 0.64 mg/dL). Liver Function Tests:  Recent Labs Lab 07/11/16 0346 07/12/16 0450 07/13/16 0552 07/14/16 0548  AST 26 24 23 19   ALT 20 18 22 21   ALKPHOS 71 61 63 53  BILITOT 1.0 0.7 0.6 0.7  PROT 8.1 7.0 7.7 7.5  ALBUMIN 3.3* 2.9* 3.0* 2.8*   No results for input(s): LIPASE, AMYLASE in the last 168 hours. No results for input(s): AMMONIA in the last 168 hours. Coagulation Profile: No results for input(s): INR, PROTIME in the last 168 hours. Cardiac Enzymes: No results for input(s): CKTOTAL, CKMB, CKMBINDEX, TROPONINI in the last 168 hours. BNP (last 3 results) No results for input(s): PROBNP in the last 8760 hours. HbA1C: No results for input(s): HGBA1C in the last 72 hours. CBG:  Recent Labs Lab 07/11/16 0847 07/12/16 0732 07/13/16 0725 07/14/16 0722  GLUCAP 86 125* 120* 183*   Lipid Profile: No results for input(s): CHOL, HDL, LDLCALC, TRIG, CHOLHDL, LDLDIRECT in the last 72 hours. Thyroid Function Tests: No results for input(s): TSH, T4TOTAL, FREET4, T3FREE, THYROIDAB in the last 72 hours. Anemia Panel: No results for  input(s): VITAMINB12, FOLATE, FERRITIN, TIBC, IRON, RETICCTPCT in the last 72 hours. Sepsis Labs: No results for input(s): PROCALCITON, LATICACIDVEN in the last  168 hours.  Recent Results (from the past 240 hour(s))  Blood Cultures x 2 sites     Status: None (Preliminary result)   Collection Time: 07/10/16  3:50 PM  Result Value Ref Range Status   Specimen Description BLOOD RIGHT ANTECUBITAL  Final   Special Requests BOTTLES DRAWN AEROBIC AND ANAEROBIC 10CC  Final   Culture   Final    NO GROWTH 4 DAYS Performed at [facility], [address] [place], [place] [address]    Report Status PENDING  Incomplete  Blood Cultures x 2 sites     Status: None (Preliminary result)   Collection Time: 07/10/16  3:51 PM  Result Value Ref Range Status   Specimen Description BLOOD LEFT FOREARM  Final   Special Requests BOTTLES DRAWN AEROBIC AND ANAEROBIC 5ML  Final   Culture   Final    NO GROWTH 4 DAYS Performed at [facility], [place] [address] [place], [place] [address]    Report Status PENDING  Incomplete  Urine culture     Status: None   Collection Time: 07/10/16  7:15 PM  Result Value Ref Range Status   Specimen Description URINE, CLEAN CATCH  Final   Special Requests NONE  Final   Culture   Final    NO GROWTH Performed at [facility], [address] [place], [place] [address]    Report Status 07/12/2016 FINAL  Final     Radiology Studies: No results found. Scheduled Meds: . amLODipine  10 mg Oral Daily  . ceFEPime (MAXIPIME) IV  2 g Intravenous Q12H  . cholecalciferol  1,000 Units Oral BID  . enoxaparin (LOVENOX) injection  40 mg Subcutaneous Q24H  . folic acid  1 mg Oral Daily  . LORazepam  2 mg Intravenous Once  . multivitamin with minerals  1 tablet Oral Daily  . nicotine  14 mg Transdermal Daily  . polyethylene glycol  17 g Oral BID  . senna-docusate  1 tablet Oral BID  . thiamine  100 mg Oral Daily   Or  . thiamine  100 mg Intravenous Daily  .  vancomycin  1,000 mg Intravenous Q8H   Continuous Infusions: . sodium chloride 75 mL/hr at 07/14/16 0939     LOS: 4 days    Time spent> 51mins  Antonina Deziel, MD PhD [facility] Pager [phone]  If 7PM-7AM, please contact night-coverage [email or web address] Password [facility] 07/14/2016, 5:47 PM

## 2016-07-14 NOTE — Evaluation (Signed)
Physical Therapy Evaluation Patient Details Name: Calvin Mckee MRN: [id number] DOB: 11/05/1950 Today's Date: 07/14/2016   History of Present Illness  Pt with osteomyelitis in RLE; PMHx of R second toe amputation on 06/15/16, tobacco abuse, EtOH abuse, and polysubtance abuse  Clinical Impression  Pt admitted with above diagnosis. Pt currently with functional limitations due to the deficits listed below (see PT Problem List).  Pt will benefit from skilled PT to increase their independence and safety with mobility to allow discharge to the venue listed below.  Pt was clear and calm at beginning of session and agreeable to get up and get OOB.  Transport arrived to take pt to MRI and pt stood and took a few hops towards [facility] before returning supine.  Pt became very restless and began moaning and yelling out.  At this time, recommend SNF, but will continue to follow and assess progress as medical condition changes.  Will await MRI results for clarification of WB status.  Pt being kept NWB at this point.    Follow Up Recommendations SNF    Equipment Recommendations  Rolling walker with 5" wheels    Recommendations for Other Services       Precautions / Restrictions Precautions Precautions: Fall Restrictions Weight Bearing Restrictions: No Other Position/Activity Restrictions: Currently keeping Pt NWB RLE until further clarification/MRI results      Mobility  Bed Mobility   Bed Mobility: Supine to Sit     Supine to sit: Min guard;HOB elevated;Min assist     General bed mobility comments: Pt moved slowly with R LE, but no physical A needed to come to EOB.  MIN A with R LE when returning supine.  Transfers Overall transfer level: Needs assistance Equipment used: Rolling walker (2 wheeled) Transfers: Sit to/from Stand Sit to Stand: Min guard         General transfer comment: Cues for hand placement which pt did not listen to.  Pt did maintain NWB R LE. Deferred SPT due to  transport arrived to take to MRI.  Ambulation/Gait Ambulation/Gait assistance: Min guard Ambulation Distance (Feet): 2 Feet Assistive device: Rolling walker (2 wheeled)       General Gait Details: Side stepping towards HOB with NWB R LE.  Stairs            Wheelchair Mobility    Modified Rankin (Stroke Patients Only)       Balance Overall balance assessment: Needs assistance Sitting-balance support: Bilateral upper extremity supported Sitting balance-Leahy Scale: Fair     Standing balance support: Bilateral upper extremity supported Standing balance-Leahy Scale: Poor                               Pertinent Vitals/Pain Pain Assessment: 0-10 Pain Score: 10-Worst pain ever Pain Location: R foot Pain Descriptors / Indicators: Constant;Burning Pain Intervention(s): Patient requesting pain meds-RN notified;Limited activity within patient's tolerance;Repositioned    Home Living Family/patient expects to be discharged to:: Private residence Living Arrangements: Other relatives Available Help at Discharge: Family;Available 24 hours/day Type of Home: House Home Access: Stairs to enter Entrance Stairs-Rails: Right;Left;Can reach both Entrance Stairs-Number of Steps: 6 Home Layout: One level Home Equipment: Cane - single point      Prior Function Level of Independence: Independent with assistive device(s)         Comments: Amb with cane since 2nd toe amputation 06/15/16, but reports it was getting worse and worse  Hand Dominance        Extremity/Trunk Assessment   Upper Extremity Assessment Upper Extremity Assessment: Defer to OT evaluation    Lower Extremity Assessment Lower Extremity Assessment: RLE deficits/detail RLE: Unable to fully assess due to pain       Communication   Communication: No difficulties  Cognition Arousal/Alertness: Awake/alert Behavior During Therapy: WFL for tasks assessed/performed;Restless;Anxious Overall  Cognitive Status: Within Functional Limits for tasks assessed                                 General Comments: Pt was clear and level throughout eval until he sat back down and returned supine for transport to take to MRI and then became very restless and anxious.  He began moaning as well.      General Comments      Exercises     Assessment/Plan    PT Assessment Patient needs continued PT services  PT Problem List Decreased strength;Decreased activity tolerance;Decreased balance;Decreased mobility;Decreased safety awareness;Pain       PT Treatment Interventions DME instruction;Gait training;Functional mobility training;Therapeutic activities;Therapeutic exercise;Balance training    PT Goals (Current goals can be found in the Care Plan section)  Acute Rehab PT Goals Patient Stated Goal: To be more independent.  PT Goal Formulation: With patient Time For Goal Achievement: 07/28/16 Potential to Achieve Goals: Good    Frequency Min 3X/week   Barriers to discharge        Co-evaluation               End of Session Equipment Utilized During Treatment: Gait belt Activity Tolerance: Patient limited by pain Patient left: in bed;with call bell/phone within reach;with nursing/sitter in room Nurse Communication: Mobility status PT Visit Diagnosis: Difficulty in walking, not elsewhere classified (R26.2);Pain Pain - Right/Left: Right Pain - part of body: Ankle and joints of foot    Time: 1771-1657 PT Time Calculation (min) (ACUTE ONLY): 27 min   Charges:   PT Evaluation $PT Eval Moderate Complexity: 1 Procedure PT Treatments $Therapeutic Activity: 8-22 mins   PT G Codes:        Ebone Alcivar L. Tamala Julian, [place] Pager [phone] 07/14/2016   Galen Manila 07/14/2016, 10:46 AM

## 2016-07-14 NOTE — Progress Notes (Signed)
OT Cancellation Note  Patient Details Name: Calvin Mckee MRN: [id number] DOB: 1951/02/12   Cancelled Treatment:    Reason Eval/Treat Not Completed: Patient at procedure or test/ unavailable; Pt in with IV team, will plan to check back tomorrow.  Lou Cal, OT Pager [phone] 07/14/2016  Raymondo Band 07/14/2016, 3:09 PM

## 2016-07-14 NOTE — Progress Notes (Signed)
Pharmacy Antibiotic Note  Calvin Mckee is a 66 y.o. male admitted on 07/10/2016 with right foot ulceration and cellulitis with concern for osteomyelitis.  Patient failed outpatient keflex, clinda, doxycycline therapies.  Pharmacy has been consulted for vancomycin and cefepime dosing.    Day #5 Vancomycin and Cefepime.  MRI to rule out OM remains pending.  SCr stable.    Vancomycin trough level subtherapeutic last night at = 69mcg/ml (goal 15-20) so regimen adjusted to 1g IV q8h  Plan: - Check vancomycin trough tomorrow AM  - Continue Cefepime 2g IV q12h.  Height: 5\' 8"  (172.7 cm) Weight: 165 lb 9.1 oz (75.1 kg) IBW/kg (Calculated) : 68.4  Temp (24hrs), Avg:99.5 F (37.5 C), Min:98.4 F (36.9 C), Max:101 F (38.3 C)   Recent Labs Lab 07/10/16 1600 07/11/16 0346 07/12/16 0450 07/13/16 0552 07/13/16 1721 07/14/16 0548  WBC 13.7* 14.8* 13.1* 12.6*  --  12.2*  CREATININE 0.69 0.73 0.62 0.58*  0.64  --  0.64  VANCOTROUGH  --   --   --   --  13*  --     Estimated Creatinine Clearance: 91.4 mL/min (by C-G formula based on SCr of 0.64 mg/dL).    No Known Allergies  Antimicrobials this admission:  3/31 CTX x 1 3/31 Flagyl x 1 3/31 Cefepime >>  4/1 Vancomycin >>   Dose adjustments this admission:  4/3 1700 VT: 13 mcg/ml on 1500 mg q12h ==> change to 1gm q8h 4/5 0500 VT: ____ on 1g q8h  Microbiology results:  3/31 BCx: ngtd 3/31 UCx: NGF  Thank you for allowing pharmacy to be a part of this patient's care.  [name], PharmD 07/14/2016 11:20 AM

## 2016-07-15 ENCOUNTER — Inpatient Hospital Stay (HOSPITAL_COMMUNITY): Payer: Medicare Other

## 2016-07-15 LAB — BASIC METABOLIC PANEL
Anion gap: 9 (ref 5–15)
BUN: 6 mg/dL (ref 6–20)
CALCIUM: 8.9 mg/dL (ref 8.9–10.3)
CHLORIDE: 91 mmol/L — AB (ref 101–111)
CO2: 24 mmol/L (ref 22–32)
CREATININE: 0.61 mg/dL (ref 0.61–1.24)
GFR calc Af Amer: >60 mL/min (ref >60)
Glucose, Bld: 108 mg/dL — ABNORMAL HIGH (ref 65–99)
POTASSIUM: 3.9 mmol/L (ref 3.5–5.1)
Sodium: 124 mmol/L — ABNORMAL LOW (ref 135–145)

## 2016-07-15 LAB — CULTURE, BLOOD (ROUTINE X 2)
CULTURE: NO GROWTH
Culture: NO GROWTH

## 2016-07-15 LAB — CBC
HEMATOCRIT: 29.5 % — AB (ref 39.0–52.0)
HEMOGLOBIN: 10.5 g/dL — AB (ref 13.0–17.0)
MCH: 29.8 pg (ref 26.0–34.0)
MCHC: 35.6 g/dL (ref 30.0–36.0)
MCV: 83.8 fL (ref 78.0–100.0)
Platelets: 408 10*3/uL — ABNORMAL HIGH (ref 150–400)
RBC: 3.52 MIL/uL — ABNORMAL LOW (ref 4.22–5.81)
RDW: 13.1 % (ref 11.5–15.5)
WBC: 11.4 10*3/uL — ABNORMAL HIGH (ref 4.0–10.5)

## 2016-07-15 LAB — SODIUM, URINE, RANDOM: Sodium, Ur: 68 mmol/L

## 2016-07-15 LAB — OSMOLALITY, URINE: OSMOLALITY UR: 321 mosm/kg (ref 300–900)

## 2016-07-15 LAB — MAGNESIUM: Magnesium: 2 mg/dL (ref 1.7–2.4)

## 2016-07-15 LAB — OSMOLALITY: Osmolality: 262 mOsm/kg — ABNORMAL LOW (ref 275–295)

## 2016-07-15 LAB — CORTISOL: CORTISOL PLASMA: 18.4 ug/dL

## 2016-07-15 LAB — VANCOMYCIN, TROUGH: Vancomycin Tr: 23 ug/mL (ref 15–20)

## 2016-07-15 LAB — GLUCOSE, CAPILLARY: Glucose-Capillary: 108 mg/dL — ABNORMAL HIGH (ref 65–99)

## 2016-07-15 LAB — URIC ACID: Uric Acid, Serum: 2.8 mg/dL — ABNORMAL LOW (ref 4.4–7.6)

## 2016-07-15 MED ORDER — HYDROMORPHONE HCL 1 MG/ML IJ SOLN
0.5000 mg | INTRAMUSCULAR | Status: DC | PRN
  Administered 2016-07-15 – 2016-07-17 (×8): 0.5 mg via INTRAVENOUS
  Filled 2016-07-15 (×9): qty 1

## 2016-07-15 MED ORDER — NICOTINE 21 MG/24HR TD PT24
21.0000 mg | MEDICATED_PATCH | Freq: Every day | TRANSDERMAL | Status: DC
  Administered 2016-07-16 – 2016-07-17 (×2): 21 mg via TRANSDERMAL
  Filled 2016-07-15 (×2): qty 1

## 2016-07-15 MED ORDER — VANCOMYCIN HCL 10 G IV SOLR
1500.0000 mg | Freq: Two times a day (BID) | INTRAVENOUS | Status: DC
  Administered 2016-07-15 – 2016-07-17 (×5): 1500 mg via INTRAVENOUS
  Filled 2016-07-15 (×6): qty 1500

## 2016-07-15 MED ORDER — CLONAZEPAM 0.5 MG PO TABS
0.2500 mg | ORAL_TABLET | Freq: Three times a day (TID) | ORAL | Status: DC | PRN
  Administered 2016-07-15 – 2016-07-17 (×5): 0.25 mg via ORAL
  Filled 2016-07-15 (×5): qty 1

## 2016-07-15 MED ORDER — SODIUM CHLORIDE 1 G PO TABS
1.0000 g | ORAL_TABLET | Freq: Two times a day (BID) | ORAL | Status: DC
  Administered 2016-07-15 – 2016-07-17 (×5): 1 g via ORAL
  Filled 2016-07-15 (×6): qty 1

## 2016-07-15 NOTE — Progress Notes (Signed)
CRITICAL VALUE ALERT  Critical value received:  Vancomycin Trough 23  Date of notification: 07/15/16  Time of notification: 0614  Critical value read back:yes  Nurse who received alert:  Dellie Catholic  MD notified (1st page):yes  Time of first page:  [phone]

## 2016-07-15 NOTE — Clinical Social Work Note (Signed)
Clinical Social Work Assessment  Patient Details  Name: Calvin Mckee MRN: [id number] Date of Birth: 29-Oct-1950  Date of referral:  07/14/16               Reason for consult:  Facility Placement, Discharge Planning                Permission sought to share information with:  Family Supports, Customer service manager (facilities for referral purposes) Permission granted to share information::  Yes, Verbal Permission Granted  Name::        Agency::     Relationship::   (mother Finnegan Gatta, sister Eilleen Kempf, both share [phone], sister Lynda Capistran [phone] (explains she cannot talk and would have to use family member to speak))  Contact Information:     Housing/Transportation Living arrangements for the past 2 months:  Apartment Source of Information:  Patient Patient Interpreter Needed:  None Criminal Activity/Legal Involvement Pertinent to Current Situation/Hospitalization:  No - Comment as needed Significant Relationships:  Other Family Members, Parents (NA/AA community) Lives with:  Self Do you feel safe going back to the place where you live?  Yes Need for family participation in patient care:  No (Coment)  Care giving concerns: pt from home (lives independently in apartment). States he was staying with sister for past couple of months prior to hospitalization for support as he had toe amputation 06/2016, and during this time has sublet his apartment (reports this will end 07/25/16). Hopes to return to his apartment once stable. Has some family involvement. At baseline ambulates/performs ADLs independently.    Social Worker assessment / plan:  Pt is a 66 yr old male admitted 07/10/16, treated for Osteomyelitis. PT recommending SNF, pt needs assistance ambulating. CSW met with pt at bedside, explained role. Pt agreeable to SNF referrals.  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  Pt also dicussed hx of substance abuse. Pt reports several years clean from drugs, is highly involved in NA/AA and counts them as main supports.  Plan- refer to SNF  Employment status:  Disabled (Comment on whether or not currently receiving Disability) Insurance information:  Medicare PT Recommendations:  [place] / Referral to community resources:  [place]  Patient/Family's Response to care:  Patient responds positively to plan  Patient/Family's Understanding of and Emotional Response to Diagnosis, Current Treatment, and Prognosis:  Pt somewhat apprehensive about d/c, stating, "I am ready to get more help. I just want to make the right decision." Reports he has never been to rehab facility before and, after explanation of goals of SNF by CSW, appeared more reassured.   Emotional Assessment Appearance:  Appears stated age Attitude/Demeanor/Rapport:  Apprehensive (pleasant (apprehensive about wanting to know d/c plan)) Affect (typically observed):  Accepting Orientation:  Oriented to Self, Oriented to Place, Oriented to  Time, Oriented to Situation Alcohol / Substance use:  Alcohol Use (reports hx of use, 5 years sober then "had a drink 3 weeks ago" ) Psych involvement (Current and /or in the community):  No (Comment)  Discharge Needs  Concerns to be addressed:  Discharge Planning Concerns Readmission within the last 30 days:  No Current discharge risk:  Dependent with Mobility, Lives alone Barriers to Discharge:  No Barriers Identified   Nila Nephew, [place] 07/15/2016, 9:36 AM

## 2016-07-15 NOTE — Progress Notes (Signed)
Pharmacy Antibiotic Note  Calvin Mckee is a 66 y.o. male admitted on 07/10/2016 with right foot ulceration and cellulitis with concern for osteomyelitis.  Patient failed outpatient keflex, clinda, doxycycline therapies.  Pharmacy has been consulted for vancomycin and cefepime dosing.    Day #6 Vancomycin and Cefepime.  MRI to rule out OM remains pending.  SCr stable.    Vancomycin trough level above goal today at 23 mcg/ml  Plan: - Change Vancomycin to 1500 mg IV q12h  VT=15-20 mg/L - Continue Cefepime 2g IV q12h.  Height: 5\' 8"  (172.7 cm) Weight: 163 lb 2.3 oz (74 kg) IBW/kg (Calculated) : 68.4  Temp (24hrs), Avg:99.1 F (37.3 C), Min:99 F (37.2 C), Max:99.2 F (37.3 C)   Recent Labs Lab 07/11/16 0346 07/12/16 0450 07/13/16 0552 07/13/16 1721 07/14/16 0548 07/15/16 0541  WBC 14.8* 13.1* 12.6*  --  12.2* 11.4*  CREATININE 0.73 0.62 0.58*  0.64  --  0.64 0.61  VANCOTROUGH  --   --   --  13*  --  23*    Estimated Creatinine Clearance: 91.4 mL/min (by C-G formula based on SCr of 0.61 mg/dL).    No Known Allergies  Antimicrobials this admission:  3/31 CTX x 1 3/31 Flagyl x 1 3/31 Cefepime >>  4/1 Vancomycin >>   Dose adjustments this admission:  4/3 1700 VT: 13 mcg/ml on 1500 mg q12h ==> change to 1gm q8h 4/5 0500 VT: 23 mcg/ml on 1g q8h > change to 1500 mg IV q12h  Microbiology results:  3/31 BCx: ngtd 3/31 UCx: NGF  Thank you for allowing pharmacy to be a part of this patient's care.  Dorrene German, PharmD 07/15/2016 6:37 AM

## 2016-07-15 NOTE — Progress Notes (Signed)
CSW met with patient at bedside, at this time he is agreeable to [facility].The patient is agreeable to meet with liaison from [place]  today or tomorrow. CSW will continue to assist with patient discharge placement.   Kathrin Greathouse, Latanya Presser, MSW Clinical Social Worker 5E and Psychiatric Service Line [phone] 07/15/2016  3:07 PM

## 2016-07-15 NOTE — NC FL2 (Signed)
[place] LEVEL OF CARE SCREENING TOOL     IDENTIFICATION  Patient Name: Calvin Mckee Birthdate: 08-01-1950 Sex: male Admission Date (Current Location): 07/10/2016  [facility] and [place] Number:  Herbalist and Address:  [facility],  [place] [address], [place]      Provider Number: [id number]  Attending Physician Name and Address:  Florencia Reasons, MD  Relative Name and Phone Number:       Current Level of Care: Hospital Recommended Level of Care: [place] Prior Approval Number:    Date Approved/Denied:   PASRR Number:   [id number]   Discharge Plan: SNF    Current Diagnoses: Patient Active Problem List   Diagnosis Date Noted  . Osteomyelitis ([place]) 07/10/2016  . Hypokalemia 07/10/2016  . Pain and swelling of right lower leg 07/10/2016  . Alcohol use 07/10/2016  . Polysubstance abuse 07/10/2016  . Tobacco use disorder 07/10/2016  . Cellulitis and abscess of toe of right foot 07/10/2016  . Toe amputation status, right ([place]) 07/06/2016    Orientation RESPIRATION BLADDER Height & Weight     Self, Time, Situation, Place  Normal Continent Weight: 163 lb 2.3 oz (74 kg) Height:  5\' 8"  (172.7 cm)  BEHAVIORAL SYMPTOMS/MOOD NEUROLOGICAL BOWEL NUTRITION STATUS      Continent Diet (Heart Healthy)  AMBULATORY STATUS COMMUNICATION OF NEEDS Skin   Extensive Assist Verbally  (Incision on toe)                       Personal Care Assistance Level of Assistance  Bathing, Feeding, Dressing Bathing Assistance: Limited assistance Feeding assistance: Independent Dressing Assistance: Limited assistance     Functional Limitations Info  Sight, Hearing, Speech Sight Info: Adequate Hearing Info: Adequate Speech Info: Adequate    SPECIAL CARE FACTORS FREQUENCY  PT (By licensed PT), OT (By licensed OT)     PT Frequency: 5 OT Frequency: 5            Contractures Contractures Info: Not present    Additional  Factors Info  Code Status, Allergies Code Status Info: Fullcode Allergies Info: No Known Allergies           Current Medications (07/15/2016):  This is the current hospital active medication list Current Facility-Administered Medications  Medication Dose Route Frequency Provider Last Rate Last Dose  . acetaminophen (TYLENOL) tablet 650 mg  650 mg Oral Q6H PRN Bertram Savin Sheikh, DO   650 mg at 07/14/16 6644   Or  . acetaminophen (TYLENOL) suppository 650 mg  650 mg Rectal Q6H PRN Bertram Savin Sheikh, DO      . amLODipine (NORVASC) tablet 10 mg  10 mg Oral Daily [facility], DO   10 mg at 07/15/16 0906  . bisacodyl (DULCOLAX) EC tablet 5 mg  5 mg Oral Daily PRN Bertram Savin Sheikh, DO      . ceFEPIme (MAXIPIME) 2 g in dextrose 5 % 50 mL IVPB  2 g Intravenous Q12H Donald Prose Runyon, RPH   2 g at 07/15/16 0906  . cholecalciferol (VITAMIN D) tablet 1,000 Units  1,000 Units Oral BID [place], DO   1,000 Units at 07/15/16 0906  . enoxaparin (LOVENOX) injection 40 mg  40 mg Subcutaneous Q24H Bertram Savin Sheikh, DO   40 mg at 07/14/16 2230  . folic acid (FOLVITE) tablet 1 mg  1 mg Oral Daily Omair Latif Sheikh, DO   1 mg at 07/15/16 0906  .  hydrALAZINE (APRESOLINE) injection 10 mg  10 mg Intravenous Q4H PRN [employer], DO      . LORazepam (ATIVAN) injection 2 mg  2 mg Intravenous Once [employer], DO      . LORazepam (ATIVAN) injection 2 mg  2 mg Intravenous Once PRN [place], DO      . morphine 4 MG/ML injection 2 mg  2 mg Intravenous Q4H PRN Bertram Savin Sheikh, DO   2 mg at 07/15/16 0355  . multivitamin with minerals tablet 1 tablet  1 tablet Oral Daily Kerney Elbe, DO   1 tablet at 07/15/16 0906  . nicotine (NICODERM CQ - dosed in mg/24 hours) patch 14 mg  14 mg Transdermal Daily [facility], DO   14 mg at 07/15/16 0906  . ondansetron (ZOFRAN) tablet 4 mg  4 mg Oral Q6H PRN Kerney Elbe, DO       Or  . ondansetron [facility]) injection 4 mg   4 mg Intravenous Q6H PRN Kerney Elbe, DO      . oxyCODONE-acetaminophen (PERCOCET/ROXICET) 5-325 MG per tablet 1 tablet  1 tablet Oral Q8H PRN Kerney Elbe, DO   1 tablet at 07/15/16 0808  . polyethylene glycol (MIRALAX / GLYCOLAX) packet 17 g  17 g Oral BID [place], DO   17 g at 07/14/16 2230  . senna-docusate (Senokot-S) tablet 1 tablet  1 tablet Oral BID Kerney Elbe, DO   1 tablet at 07/14/16 2230  . sodium chloride tablet 1 g  1 g Oral BID WC Florencia Reasons, MD   1 g at 07/15/16 1036  . thiamine (VITAMIN B-1) tablet 100 mg  100 mg Oral Daily [facility], DO   100 mg at 07/15/16 6195   Or  . thiamine (B-1) injection 100 mg  100 mg Intravenous Daily Omair Latif Sheikh, DO      . traMADol Veatrice [place]bon) tablet 50 mg  50 mg Oral Q6H PRN Bertram Savin Sheikh, DO   50 mg at 07/15/16 0108  . vancomycin (VANCOCIN) 1,500 mg in sodium chloride 0.9 % 500 mL IVPB  1,500 mg Intravenous Q12H Dorrene German, RPH   1,500 mg at 07/15/16 0906     Discharge Medications: Please see discharge summary for a list of discharge medications.  Relevant Imaging Results:  Relevant Lab Results:   Additional Information [id number]  Lia Hopping, LCSW

## 2016-07-15 NOTE — Progress Notes (Signed)
PROGRESS NOTE    Calvin Mckee  CBJ:[id number] DOB: 1950/10/12 DOA: 07/10/2016 PCP: Pcp Not In System  Brief Narrative:  Calvin Mckee is a 66 y.o. male with medical history significant of Recent Right 2nd toe Amputation (been on Abx with Keflex, Clindamycin, and Doxycycline), Tobacco Abuse, EtOH use, Polysubstance Abuse including recent Tobacco Abuse who presented with Right Foot pain that worsened over the last 2 days. Patient states that his leg started swelling 3 days ago and that he was not able to walk on it and put any pressure on it since yesterday. Patient recently had a 2nd Right Toe Amputation on March 6 at [facility] in [place] and recently saw Dr. Erlinda Hong at [facility] 07/06/16. States that he ran out of his pain meds and that toe and foot worsened over the last two days and now the pain is unbearable. No nausea, vomiting, chest pain or SOB. TRH was called to admit patient for Cellulitis and Suspected Osteomyelitis. Consulted Orthopedics Dr. Marlou Sa who is Awaiting MRI to evaluate foot for further evaluation. This is still to be done as patient not able to sit still to do examination and was brought down yesterday and unable to get diagnostic images. Patient unable to sit still today during examination and states pain is unbearable and was not able to be evaluated by PT due to pain. Given additional Morphine today and will likely need anxiolytic if MRI is attempted again.   Assessment & Plan:   Principal Problem:   Osteomyelitis ([place]) Active Problems:   Hypokalemia   Pain and swelling of right lower leg   Alcohol use   Polysubstance abuse   Tobacco use disorder   Cellulitis and abscess of toe of right foot  Right Foot Second Toe Ulceration and Cellulitis with Concern for Right Foot Osteomyelitis /sepsis presented on admission -Failed Outpatient Treatment with Keflex, Clindamycin, and Doxycycline and had operation on March 6th -Patient was Febrile today at 101 and  Tachycardic and Tachypenic; WBC 14.8 on admission, meet sepsis criteria, blood culture no growth, urine culture no growth, cxr with minimal atelectasis, no acute infiltrate, he has palpable pedal pulse, --Foot X-Ray showed Prior amputation of RIGHT second toe through the proximal phalanx with poor definition of the osseous stump, could represent resorption secondary to resection but osteomyelitis is not excluded. -- Vascular Duplex showed that the RLE is Negative for DVT and no evidence of Right Bakers Cyst but there was an incidental finding of a hetrerogenous area of the Right Groin suggestive of a possible Prominent inguinal Lymph node --Consulted Orthopedics Dr. Marlou Sa (Patient has seen [facility]) - Saw Orthopedics Dr. Erlinda Hong on 07/06/16 and was given Doxycycline and Ibuprofen; Awaiting for MRI to be done to further evaluate, (may need to discuss with radiology for other imaging modality including bone scan or CT if still not able to get mri) -Wound Care Nurse Consultation appreciated -C/w [employer] Abx with IV Vanc and IV Cefepime with Pharmacy to dose, S/p total of 60 mg of IV Lasix   Hyponatremia,  -Low Sodium possible from [employer] Potomania  -serum/urine osmo/sodium suggest SIADH, am cortisol level and tsh wnl, low serum uric acid also support siadh,  - start fluids restriction on 4/5  Hypokalemia, replaced, check mag 2.  Accelerated Hypertension -Likely from Pain and Agitation and mild Fever -C/w Amlodipine 10 mg po Daily, h/o cocaine use, will need to avoid betablocker -IV Hydralazine 10 mg q4hprn for SBP >180 or DBP >100 -Continue Pain Control  and Acetaminophen for Fever  EtOH Use Concern for Withdrawal of Alcohol and Opiates -Patient states he drinks at leas 2 beers Daily -CIWA Protocol with Lorazepam -C/w MVI, Folic Acid and Thiamine -C/w Percocet q8hprn and Morphine 2 mg q4hprn    Tobacco Abuse -Nicotine Patch increase to 21mg  ,  -Smoking Cessation Counseling  Given  Hx of Polysubstance Abuse --Has Hx of Cocaine Abuse and states he did smoked Crack Cocaine 2 weeks ago. Use percocet from the street,  Patient is has intermittent agitation, threatening to leave AMA at time, suspect pain, cigarette craving, alcohol withdrawal? He is oriented x3, will try to increase prn pain meds, increase nicotine dose, add prn klonopin, psych consult for capacity eval and agitation management.  DVT prophylaxis: Lovenox 40 mg sq Code Status: FULL CODE Family Communication: No Family present  Disposition Plan: SNF;  Consultants:   Orthopedic Surgery Dr. Marlou Sa    Procedures: MRI of Right Foot still pending   Antimicrobials:  Anti-infectives    Start     Dose/Rate Route Frequency Ordered Stop   07/15/16 1000  vancomycin (VANCOCIN) 1,500 mg in sodium chloride 0.9 % 500 mL IVPB     1,500 mg 250 mL/hr over 120 Minutes Intravenous Every 12 hours 07/15/16 0637     07/14/16 0500  vancomycin (VANCOCIN) IVPB 1000 mg/200 mL premix  Status:  Discontinued     1,000 mg 200 mL/hr over 60 Minutes Intravenous Every 8 hours 07/13/16 1907 07/15/16 0637   07/13/16 2200  ceFEPIme (MAXIPIME) 2 g in dextrose 5 % 50 mL IVPB     2 g 100 mL/hr over 30 Minutes Intravenous Every 12 hours 07/13/16 1258     07/13/16 1400  ceFEPIme (MAXIPIME) 1 g in dextrose 5 % 50 mL IVPB     1 g 100 mL/hr over 30 Minutes Intravenous Every 8 hours 07/13/16 1258 07/13/16 1359   07/10/16 2200  ceFEPIme (MAXIPIME) 1 g in dextrose 5 % 50 mL IVPB  Status:  Discontinued     1 g 100 mL/hr over 30 Minutes Intravenous Every 8 hours 07/10/16 1830 07/13/16 1258   07/10/16 1830  vancomycin (VANCOCIN) 1,500 mg in sodium chloride 0.9 % 500 mL IVPB  Status:  Discontinued     1,500 mg 250 mL/hr over 120 Minutes Intravenous Every 12 hours 07/10/16 1826 07/13/16 1907   07/10/16 1700  cefTRIAXone (ROCEPHIN) 2 g in dextrose 5 % 50 mL IVPB  Status:  Discontinued     2 g 100 mL/hr over 30 Minutes Intravenous Every 24  hours 07/10/16 1655 07/10/16 1826   07/10/16 1700  metroNIDAZOLE (FLAGYL) tablet 500 mg  Status:  Discontinued     500 mg Oral Every 8 hours 07/10/16 1655 07/10/16 1826     Subjective: Right foot pain and swelling seems improving,  Last fever on 4/3 2pm, He is aaox3 today, but with intermittent agitation  Objective: Vitals:   07/14/16 2058 07/14/16 2123 07/15/16 0400 07/15/16 0403  BP: (!) 174/91 (!) 168/93 (!) 159/90   Pulse: 100 98 97   Resp: (!) 22  16   Temp: 99.2 F (37.3 C)  99.2 F (37.3 C)   TempSrc: Oral  Oral   SpO2: 99%  99%   Weight:    74 kg (163 lb 2.3 oz)  Height:        Intake/Output Summary (Last 24 hours) at 07/15/16 0820 Last data filed at 07/15/16 0400  Gross per 24 hour  Intake  2966.25 ml  Output             1950 ml  Net          1016.25 ml   Filed Weights   07/13/16 0455 07/14/16 0510 07/15/16 0403  Weight: 73.4 kg (161 lb 13.1 oz) 75.1 kg (165 lb 9.1 oz) 74 kg (163 lb 2.3 oz)   Examination: Physical Exam:  Constitutional:  aaox3, intermittent agitation Eyes: Lids and conjunctivae normal, sclerae anicteric  ENMT: External Ears, Nose appear normal. Grossly normal hearing Neck: Appears normal, supple, no cervical masses, normal ROM, no appreciable thyromegaly, no JVD Respiratory: Clear to auscultation bilaterally, no wheezing, rales, rhonchi or crackles. Normal respiratory effort and patient is not tachypenic. No accessory muscle use.  Cardiovascular: Slightly tachycardic, no murmurs / rubs / gallops. S1 and S2 auscultated. Trace Right Leg Lower Extremity Edema improved warmth and some erythema but still has some Pedal Edema; Difficult to palpate Right pedal pulse because of swelling  Abdomen: Soft, non-tender, non-distended. No masses palpated. No appreciable hepatosplenomegaly. Bowel sounds positive.  GU: Deferred. Musculoskeletal: No clubbing / cyanosis of digits/nails. Right toe ulceration less warmth and erythema Skin: Right 2nd toe  ulceration and pedal swelling Neurologic: CN 2-12 grossly intact with no focal deficits.  Romberg sign cerebellar reflexes not assessed.  Psychiatric: Normal judgment and insight. Alert and awake. Agitated and Anxious mood and inappropriate affect.   Labs on Admission: I have personally reviewed following labs and imaging studies  Data Reviewed: I have personally reviewed following labs and imaging studies  CBC:  Recent Labs Lab 07/10/16 1600 07/11/16 0346 07/12/16 0450 07/13/16 0552 07/14/16 0548 07/15/16 0541  WBC 13.7* 14.8* 13.1* 12.6* 12.2* 11.4*  NEUTROABS 10.0* 11.7* 9.2* 9.0* 8.0*  --   HGB 10.9* 12.4* 11.0* 11.4* 9.8* 10.5*  HCT 30.9* 35.2* 31.0* 32.7* 28.2* 29.5*  MCV 87.0 87.6 87.8 87.7 84.7 83.8  PLT 417* 523* 389 424* 352 063*   Basic Metabolic Panel:  Recent Labs Lab 07/11/16 0346 07/12/16 0450 07/13/16 0552 07/14/16 0548 07/15/16 0541  NA 132* 133* 129* 129* 124*  K 3.5 3.6 3.7 3.6 3.9  CL 94* 97* 93* 93* 91*  CO2 26 28 26 28 24   GLUCOSE 73 107* 112* 106* 108*  BUN 10 9 8 7 6   CREATININE 0.73 0.62 0.58*  0.64 0.64 0.61  CALCIUM 9.3 8.9 9.1 8.6* 8.9  MG 1.8 1.8 1.8 1.9 2.0  PHOS 3.7 2.6 3.6 4.0  --    GFR: Estimated Creatinine Clearance: 91.4 mL/min (by C-G formula based on SCr of 0.61 mg/dL). Liver Function Tests:  Recent Labs Lab 07/11/16 0346 07/12/16 0450 07/13/16 0552 07/14/16 0548  AST 26 24 23 19   ALT 20 18 22 21   ALKPHOS 71 61 63 53  BILITOT 1.0 0.7 0.6 0.7  PROT 8.1 7.0 7.7 7.5  ALBUMIN 3.3* 2.9* 3.0* 2.8*   No results for input(s): LIPASE, AMYLASE in the last 168 hours. No results for input(s): AMMONIA in the last 168 hours. Coagulation Profile: No results for input(s): INR, PROTIME in the last 168 hours. Cardiac Enzymes: No results for input(s): CKTOTAL, CKMB, CKMBINDEX, TROPONINI in the last 168 hours. BNP (last 3 results) No results for input(s): PROBNP in the last 8760 hours. HbA1C: No results for input(s): HGBA1C in  the last 72 hours. CBG:  Recent Labs Lab 07/11/16 0847 07/12/16 0732 07/13/16 0725 07/14/16 0722  GLUCAP 86 125* 120* 183*   Lipid Profile: No results for input(s): CHOL,  HDL, LDLCALC, TRIG, CHOLHDL, LDLDIRECT in the last 72 hours. Thyroid Function Tests: No results for input(s): TSH, T4TOTAL, FREET4, T3FREE, THYROIDAB in the last 72 hours. Anemia Panel: No results for input(s): VITAMINB12, FOLATE, FERRITIN, TIBC, IRON, RETICCTPCT in the last 72 hours. Sepsis Labs: No results for input(s): PROCALCITON, LATICACIDVEN in the last 168 hours.  Recent Results (from the past 240 hour(s))  Blood Cultures x 2 sites     Status: None (Preliminary result)   Collection Time: 07/10/16  3:50 PM  Result Value Ref Range Status   Specimen Description BLOOD RIGHT ANTECUBITAL  Final   Special Requests BOTTLES DRAWN AEROBIC AND ANAEROBIC 10CC  Final   Culture   Final    NO GROWTH 4 DAYS Performed at [facility], [address], [place], [place] [address]    Report Status PENDING  Incomplete  Blood Cultures x 2 sites     Status: None (Preliminary result)   Collection Time: 07/10/16  3:51 PM  Result Value Ref Range Status   Specimen Description BLOOD LEFT FOREARM  Final   Special Requests BOTTLES DRAWN AEROBIC AND ANAEROBIC 5ML  Final   Culture   Final    NO GROWTH 4 DAYS Performed at [facility], [place] [address] [place], [place] [address]    Report Status PENDING  Incomplete  Urine culture     Status: None   Collection Time: 07/10/16  7:15 PM  Result Value Ref Range Status   Specimen Description URINE, CLEAN CATCH  Final   Special Requests NONE  Final   Culture   Final    NO GROWTH Performed at [facility], [address] [place], [place] [address]    Report Status 07/12/2016 FINAL  Final     Radiology Studies: Dg Chest Port 1 View  Result Date: 07/14/2016 CLINICAL DATA:  Fever with right foot infection EXAM: PORTABLE CHEST 1 VIEW COMPARISON:  02/11/2010  FINDINGS: Surgical wire at the lower cervical spine. Minimal atelectasis right base. No consolidation or effusion. Heart size upper normal. Minimal atherosclerosis. No pneumothorax. IMPRESSION: Minimal atelectasis at the right base. No acute infiltrate or edema. Electronically Signed   By: Donavan Foil M.D.   On: 07/14/2016 18:26   Scheduled Meds: . amLODipine  10 mg Oral Daily  . ceFEPime (MAXIPIME) IV  2 g Intravenous Q12H  . cholecalciferol  1,000 Units Oral BID  . enoxaparin (LOVENOX) injection  40 mg Subcutaneous Q24H  . folic acid  1 mg Oral Daily  . LORazepam  2 mg Intravenous Once  . multivitamin with minerals  1 tablet Oral Daily  . nicotine  14 mg Transdermal Daily  . polyethylene glycol  17 g Oral BID  . senna-docusate  1 tablet Oral BID  . sodium chloride  1 g Oral BID WC  . thiamine  100 mg Oral Daily   Or  . thiamine  100 mg Intravenous Daily  . vancomycin  1,500 mg Intravenous Q12H   Continuous Infusions:    LOS: 5 days    Time spent> 9mins  Sapir Lavey, MD PhD [facility] Pager [phone]  If 7PM-7AM, please contact night-coverage [email or web address] Password [email or web address] 07/15/2016, 8:20 AM

## 2016-07-15 NOTE — Progress Notes (Signed)
qPhysical Therapy Treatment Patient Details Name: Calvin Mckee MRN: [id number] DOB: 1950/08/26 Today's Date: 07/15/2016    History of Present Illness Pt with osteomyelitis in RLE; PMHx of R second toe amputation on 06/15/16, tobacco abuse, EtOH abuse, and polysubtance abuse    PT Comments    Assisted OOB to amb (hop) to bathroom and back to bed was all pt could tolerate.  MAX c/o R foot pain with activity and leg down in dependent position.  Pt was unable to tolerate any weight thru R LE.  In fact, pt held his R foot off the floor and hopped.  MAX grimacing with PAIN and increased anxiety after activity.  Pt unable to find a comfortable position for his leg.  Constant motion. RN notified.   Pt has a cast shoe for his foot but since he was unable to place foot on floor/WB I left it off.    Follow Up Recommendations  SNF     Equipment Recommendations       Recommendations for Other Services       Precautions / Restrictions Precautions Precautions: Fall Restrictions Weight Bearing Restrictions: No Other Position/Activity Restrictions: pt keeping R foot off floor during trans and gait due to severe pain    Mobility  Bed Mobility Overal bed mobility: Needs Assistance Bed Mobility: Sit to Supine       Sit to supine: Supervision   General bed mobility comments: pt able to self perform.  Grimacing with pain, constant motion, moaining trying to find a comfortable position  Transfers Overall transfer level: Needs assistance Equipment used: Rolling walker (2 wheeled) Transfers: Sit to/from Stand Sit to Stand: Min guard         General transfer comment: assisted off bed and back to bed with tendency to pull self up from walker and sit prior to completion due to pain  Ambulation/Gait Ambulation/Gait assistance: Min assist Ambulation Distance (Feet): 10 Feet Assistive device: Rolling walker (2 wheeled) Gait Pattern/deviations: Step-to pattern Gait velocity: decreased    General Gait Details: pt held R LE(foot) off floor and "hopped" to bathroom with much B UE pressure thru walker.  Pt was able to static stand infront of toilet to void while one one foot.     Stairs            Wheelchair Mobility    Modified [place] (Stroke Patients Only)       Balance                                            Cognition Arousal/Alertness: Awake/alert Behavior During Therapy: WFL for tasks assessed/performed Overall Cognitive Status: Within Functional Limits for tasks assessed                                 General Comments: pt AxO x 3       Exercises      General Comments        Pertinent Vitals/Pain Pain Assessment: Faces Faces Pain Scale: Hurts whole lot Pain Location: MAX grimacing, moaining, "burining" R lateral foot and pinky toe area Pain Descriptors / Indicators: Constant;Burning;Nagging;Grimacing Pain Intervention(s): Monitored during session;Repositioned;Patient requesting pain meds-RN notified    Home Living  Prior Function            PT Goals (current goals can now be found in the care plan section) Progress towards PT goals: Progressing toward goals    Frequency           PT Plan Current plan remains appropriate    Co-evaluation             End of Session Equipment Utilized During Treatment: Gait belt Activity Tolerance: Patient limited by pain Patient left: in bed;with call bell/phone within reach;with nursing/sitter in room Nurse Communication: Mobility status (2 urinals total 600 was emptied and pt voided in commode (not measured)) PT Visit Diagnosis: Difficulty in walking, not elsewhere classified (R26.2);Pain     Time: 7998-7215 PT Time Calculation (min) (ACUTE ONLY): 12 min  Charges:  $Gait Training: 8-22 mins                    G Codes:       Rica Koyanagi  PTA [facility]  Acute  Rehab Pager      [phone]

## 2016-07-16 DIAGNOSIS — Z79899 Other long term (current) drug therapy: Secondary | ICD-10-CM

## 2016-07-16 DIAGNOSIS — Z89421 Acquired absence of other right toe(s): Secondary | ICD-10-CM

## 2016-07-16 DIAGNOSIS — Z79891 Long term (current) use of opiate analgesic: Secondary | ICD-10-CM

## 2016-07-16 DIAGNOSIS — F17210 Nicotine dependence, cigarettes, uncomplicated: Secondary | ICD-10-CM

## 2016-07-16 DIAGNOSIS — F4323 Adjustment disorder with mixed anxiety and depressed mood: Secondary | ICD-10-CM

## 2016-07-16 LAB — CBC WITH DIFFERENTIAL/PLATELET
Basophils Absolute: 0.1 10*3/uL (ref 0.0–0.1)
Basophils Relative: 0 %
EOS ABS: 0.1 10*3/uL (ref 0.0–0.7)
EOS PCT: 1 %
HCT: 32 % — ABNORMAL LOW (ref 39.0–52.0)
Hemoglobin: 11.4 g/dL — ABNORMAL LOW (ref 13.0–17.0)
LYMPHS ABS: 1.9 10*3/uL (ref 0.7–4.0)
LYMPHS PCT: 17 %
MCH: 30.1 pg (ref 26.0–34.0)
MCHC: 35.6 g/dL (ref 30.0–36.0)
MCV: 84.4 fL (ref 78.0–100.0)
MONO ABS: 1.4 10*3/uL — AB (ref 0.1–1.0)
MONOS PCT: 12 %
Neutro Abs: 8.2 10*3/uL — ABNORMAL HIGH (ref 1.7–7.7)
Neutrophils Relative %: 70 %
PLATELETS: 471 10*3/uL — AB (ref 150–400)
RBC: 3.79 MIL/uL — AB (ref 4.22–5.81)
RDW: 13.1 % (ref 11.5–15.5)
WBC: 11.7 10*3/uL — AB (ref 4.0–10.5)

## 2016-07-16 LAB — GLUCOSE, CAPILLARY: Glucose-Capillary: 124 mg/dL — ABNORMAL HIGH (ref 65–99)

## 2016-07-16 LAB — C-REACTIVE PROTEIN: CRP: 19.5 mg/dL — ABNORMAL HIGH (ref <1.0)

## 2016-07-16 LAB — BASIC METABOLIC PANEL
Anion gap: 11 (ref 5–15)
BUN: 7 mg/dL (ref 6–20)
CO2: 26 mmol/L (ref 22–32)
CREATININE: 0.67 mg/dL (ref 0.61–1.24)
Calcium: 9.4 mg/dL (ref 8.9–10.3)
Chloride: 96 mmol/L — ABNORMAL LOW (ref 101–111)
GFR calc Af Amer: >60 mL/min (ref >60)
GLUCOSE: 103 mg/dL — AB (ref 65–99)
POTASSIUM: 4.1 mmol/L (ref 3.5–5.1)
SODIUM: 133 mmol/L — AB (ref 135–145)

## 2016-07-16 LAB — SEDIMENTATION RATE: Sed Rate: 100 mm/hr — ABNORMAL HIGH (ref 0–16)

## 2016-07-16 MED ORDER — DIPHENHYDRAMINE HCL 50 MG/ML IJ SOLN
12.5000 mg | Freq: Three times a day (TID) | INTRAMUSCULAR | Status: DC | PRN
  Administered 2016-07-16: 12.5 mg via INTRAVENOUS
  Filled 2016-07-16: qty 1

## 2016-07-16 NOTE — Consult Note (Signed)
[facility] Face-to-Face Psychiatry Consult   Reason for Consult:  Capacity Referring Physician:  DR. Xu Patient Identification: Calvin Mckee MRN:  [id number] Principal Diagnosis: Osteomyelitis [facility]) Diagnosis:   Patient Active Problem List   Diagnosis Date Noted  . Osteomyelitis ([place]) [M86.9] 07/10/2016  . Hypokalemia [E87.6] 07/10/2016  . Pain and swelling of right lower leg [M79.661, M79.89] 07/10/2016  . Alcohol use [Z78.9] 07/10/2016  . Polysubstance abuse [F19.10] 07/10/2016  . Tobacco use disorder [F17.200] 07/10/2016  . Cellulitis and abscess of toe of right foot [L03.031, L02.611] 07/10/2016  . Toe amputation status, right [facility]) [Z89.421] 07/06/2016    Total Time spent with patient: 1 hour  Subjective:   Calvin Mckee is a 66 y.o. male patient admitted with worsened right foot pain and recent history of right toe amputation.   HPI:  Calvin Mckee a 66 y.o.malewith medical history significant of Recent Right 2nd toe Amputation (been on Abx with Keflex, Clindamycin, and Doxycycline), Tobacco Abuse, EtOH use, Polysubstance Abuse including recent Tobacco Abuse who presented with Right Foot pain that worsened over the last 2 days.   Patient seen, chart reviewed case discussed with the staff RN, hospitalist and LCSW for this face-to-face psychiatry consultation and evaluation of capacity to make his own medical decisions. Patient appeared sitting in a chair/recliner and has been emotional regarding pain associated with his right foot.  Patient was able to calm himself and able to participate in the evaluation and staff nurse is able to provide his morning medication including Dilaudid.Patient reported he works for a [employer] and feels that he already lost 1 toe and he wants to save the second toe. Patient stated he is hoping he can save it and also consider going to medical to December to [place] where one of his friend has received appropriate care like his problem.  Patient reported previously he was in skilled nursing facility and is willing to participate another one is needed. Patient denies symptoms of depression, anxiety, suicidal/homicidal ideation and has no evidence of psychosis. Reportedly patient has been waiting for the MRI scan of the foot and also orthopedics consultation with Dr. Marlou Sa.   Past Psychiatric History: Polysubstance abuse and has a history of inpatient rehabilitation.  Risk to Self: Is patient at risk for suicide?: No Risk to Others:   Prior Inpatient Therapy:   Prior Outpatient Therapy:    Past Medical History:  Past Medical History:  Diagnosis Date  . Arthritis   . Hypertension     Past Surgical History:  Procedure Laterality Date  . CERVICAL FUSION     Family History: History reviewed. No pertinent family history. Family Psychiatric  History: noncontributory. Social History:  History  Alcohol Use  . Yes     History  Drug Use  . Types: "Crack" cocaine, Other-see comments    Comment: percocet     Social History   Social History  . Marital status: Single    Spouse name: N/A  . Number of children: N/A  . Years of education: N/A   Social History Main Topics  . Smoking status: Current Every Day Smoker    Packs/day: 0.50    Types: Cigarettes  . Smokeless tobacco: Never Used  . Alcohol use Yes  . Drug use: Yes    Types: "Crack" cocaine, Other-see comments     Comment: percocet   . Sexual activity: Not Asked   Other Topics Concern  . None   Social History Narrative  . None  Additional Social History:    Allergies:  No Known Allergies  Labs:  Results for orders placed or performed during the hospital encounter of 07/10/16 (from the past 48 hour(s))  Sodium, urine, random     Status: None   Collection Time: 07/14/16  8:57 PM  Result Value Ref Range   Sodium, Ur 68 mmol/L    Comment: Performed at [facility], [address] [place], [place] [address]  Osmolality, urine     Status: None    Collection Time: 07/14/16  8:58 PM  Result Value Ref Range   Osmolality, Ur 321 300 - 900 mOsm/kg    Comment: Performed at [facility] [address] [place], [place] [address]  Osmolality     Status: Abnormal   Collection Time: 07/15/16  5:41 AM  Result Value Ref Range   Osmolality 262 (L) 275 - 295 mOsm/kg    Comment: Performed at [facility], [place] [address] [place], [place] [address]  Cortisol     Status: None   Collection Time: 07/15/16  5:41 AM  Result Value Ref Range   Cortisol, Plasma 18.4 ug/dL    Comment: (NOTE) AM    6.7 - 22.6 ug/dL PM   <10.0       ug/dL Performed at [facility] [address], [place], [place] [address]   Uric acid     Status: Abnormal   Collection Time: 07/15/16  5:41 AM  Result Value Ref Range   Uric Acid, Serum 2.8 (L) 4.4 - 7.6 mg/dL  CBC     Status: Abnormal   Collection Time: 07/15/16  5:41 AM  Result Value Ref Range   WBC 11.4 (H) 4.0 - 10.5 K/uL   RBC 3.52 (L) 4.22 - 5.81 MIL/uL   Hemoglobin 10.5 (L) 13.0 - 17.0 g/dL   HCT 29.5 (L) 39.0 - 52.0 %   MCV 83.8 78.0 - 100.0 fL   MCH 29.8 26.0 - 34.0 pg   MCHC 35.6 30.0 - 36.0 g/dL   RDW 13.1 11.5 - 15.5 %   Platelets 408 (H) 150 - 400 K/uL  Basic metabolic panel     Status: Abnormal   Collection Time: 07/15/16  5:41 AM  Result Value Ref Range   Sodium 124 (L) 135 - 145 mmol/L   Potassium 3.9 3.5 - 5.1 mmol/L   Chloride 91 (L) 101 - 111 mmol/L   CO2 24 22 - 32 mmol/L   Glucose, Bld 108 (H) 65 - 99 mg/dL   BUN 6 6 - 20 mg/dL   Creatinine, Ser 0.61 0.61 - 1.24 mg/dL   Calcium 8.9 8.9 - 10.3 mg/dL   GFR calc non Af Amer >60 >60 mL/min   GFR calc Af Amer >60 >60 mL/min    Comment: (NOTE) The eGFR has been calculated using the CKD EPI equation. This calculation has not been validated in all clinical situations. eGFR's persistently <60 mL/min signify possible Chronic Kidney Disease.    Anion gap 9 5 - 15  Magnesium     Status: None   Collection Time:  07/15/16  5:41 AM  Result Value Ref Range   Magnesium 2.0 1.7 - 2.4 mg/dL  Vancomycin, trough     Status: Abnormal   Collection Time: 07/15/16  5:41 AM  Result Value Ref Range   Vancomycin Tr 23 (HH) 15 - 20 ug/mL    Comment: CRITICAL RESULT CALLED TO, READ BACK BY AND VERIFIED WITH: SCUTTON,J  RN 4.5.18 _0  ZANDO,C   Glucose, capillary     Status: Abnormal   Collection Time: 07/15/16  8:20 AM  Result Value Ref Range   Glucose-Capillary 108 (H) 65 - 99 mg/dL  CBC with Differential/Platelet     Status: Abnormal   Collection Time: 07/16/16  6:11 AM  Result Value Ref Range   WBC 11.7 (H) 4.0 - 10.5 K/uL   RBC 3.79 (L) 4.22 - 5.81 MIL/uL   Hemoglobin 11.4 (L) 13.0 - 17.0 g/dL   HCT 32.0 (L) 39.0 - 52.0 %   MCV 84.4 78.0 - 100.0 fL   MCH 30.1 26.0 - 34.0 pg   MCHC 35.6 30.0 - 36.0 g/dL   RDW 13.1 11.5 - 15.5 %   Platelets 471 (H) 150 - 400 K/uL   Neutrophils Relative % 70 %   Neutro Abs 8.2 (H) 1.7 - 7.7 K/uL   Lymphocytes Relative 17 %   Lymphs Abs 1.9 0.7 - 4.0 K/uL   Monocytes Relative 12 %   Monocytes Absolute 1.4 (H) 0.1 - 1.0 K/uL   Eosinophils Relative 1 %   Eosinophils Absolute 0.1 0.0 - 0.7 K/uL   Basophils Relative 0 %   Basophils Absolute 0.1 0.0 - 0.1 K/uL  Basic metabolic panel     Status: Abnormal   Collection Time: 07/16/16  6:11 AM  Result Value Ref Range   Sodium 133 (L) 135 - 145 mmol/L    Comment: DELTA CHECK NOTED REPEATED TO VERIFY    Potassium 4.1 3.5 - 5.1 mmol/L   Chloride 96 (L) 101 - 111 mmol/L   CO2 26 22 - 32 mmol/L   Glucose, Bld 103 (H) 65 - 99 mg/dL   BUN 7 6 - 20 mg/dL   Creatinine, Ser 0.67 0.61 - 1.24 mg/dL   Calcium 9.4 8.9 - 10.3 mg/dL   GFR calc non Af Amer >60 >60 mL/min   GFR calc Af Amer >60 >60 mL/min    Comment: (NOTE) The eGFR has been calculated using the CKD EPI equation. This calculation has not been validated in all clinical situations. eGFR's persistently <60 mL/min signify possible Chronic Kidney Disease.    Anion  gap 11 5 - 15  Sedimentation rate     Status: Abnormal   Collection Time: 07/16/16  6:11 AM  Result Value Ref Range   Sed Rate 100 (H) 0 - 16 mm/hr  C-reactive protein     Status: Abnormal   Collection Time: 07/16/16  6:11 AM  Result Value Ref Range   CRP 19.5 (H) <1.0 mg/dL    Comment: Performed at [facility] [address], [place], [place] [address]  Glucose, capillary     Status: Abnormal   Collection Time: 07/16/16  7:30 AM  Result Value Ref Range   Glucose-Capillary 124 (H) 65 - 99 mg/dL    Current Facility-Administered Medications  Medication Dose Route Frequency Provider Last Rate Last Dose  . acetaminophen (TYLENOL) tablet 650 mg  650 mg Oral Q6H PRN [place], DO   650 mg at 07/15/16 1432   Or  . acetaminophen (TYLENOL) suppository 650 mg  650 mg Rectal Q6H PRN Bertram Savin Sheikh, DO      . amLODipine (NORVASC) tablet 10 mg  10 mg Oral Daily [facility], DO   10 mg at 07/15/16 0906  . bisacodyl (DULCOLAX) EC tablet 5 mg  5 mg Oral Daily PRN Kerney Elbe, DO      .  ceFEPIme (MAXIPIME) 2 g in dextrose 5 % 50 mL IVPB  2 g Intravenous Q12H Dara Hoyer, RPH   2 g at 07/15/16 2105  . cholecalciferol (VITAMIN D) tablet 1,000 Units  1,000 Units Oral BID [place], DO   1,000 Units at 07/15/16 2105  . clonazePAM (KLONOPIN) tablet 0.25 mg  0.25 mg Oral TID PRN Florencia Reasons, MD   0.25 mg at 07/16/16 0052  . enoxaparin (LOVENOX) injection 40 mg  40 mg Subcutaneous Q24H Bertram Savin Sheikh, DO   40 mg at 07/15/16 2105  . folic acid (FOLVITE) tablet 1 mg  1 mg Oral Daily Omair Latif Sheikh, DO   1 mg at 07/15/16 0906  . hydrALAZINE (APRESOLINE) injection 10 mg  10 mg Intravenous Q4H PRN [employer], DO      . HYDROmorphone (DILAUDID) injection 0.5 mg  0.5 mg Intravenous Q4H PRN Florencia Reasons, MD   0.5 mg at 07/16/16 0556  . LORazepam (ATIVAN) injection 2 mg  2 mg Intravenous Once [facility], DO      . morphine 4 MG/ML injection 2 mg  2 mg  Intravenous Q4H PRN Bertram Savin Sheikh, DO   2 mg at 07/15/16 0355  . multivitamin with minerals tablet 1 tablet  1 tablet Oral Daily Kerney Elbe, DO   1 tablet at 07/15/16 0906  . nicotine (NICODERM CQ - dosed in mg/24 hours) patch 21 mg  21 mg Transdermal Daily Florencia Reasons, MD      . ondansetron [facility]) tablet 4 mg  4 mg Oral Q6H PRN Kerney Elbe, DO       Or  . ondansetron (ZOFRAN) injection 4 mg  4 mg Intravenous Q6H PRN Kerney Elbe, DO      . oxyCODONE-acetaminophen (PERCOCET/ROXICET) 5-325 MG per tablet 1 tablet  1 tablet Oral Q8H PRN Kerney Elbe, DO   1 tablet at 07/16/16 0236  . polyethylene glycol (MIRALAX / GLYCOLAX) packet 17 g  17 g Oral BID [place], DO   17 g at 07/15/16 2105  . senna-docusate (Senokot-S) tablet 1 tablet  1 tablet Oral BID Kerney Elbe, DO   1 tablet at 07/15/16 2105  . sodium chloride tablet 1 g  1 g Oral BID WC Florencia Reasons, MD   1 g at 07/15/16 1629  . thiamine (VITAMIN B-1) tablet 100 mg  100 mg Oral Daily [place], DO   100 mg at 07/15/16 0905  . traMADol (ULTRAM) tablet 50 mg  50 mg Oral Q6H PRN Bertram Savin Sheikh, DO   50 mg at 07/15/16 0108  . vancomycin (VANCOCIN) 1,500 mg in sodium chloride 0.9 % 500 mL IVPB  1,500 mg Intravenous Q12H Dorrene German, RPH   1,500 mg at 07/15/16 2104    Musculoskeletal: Strength & Muscle Tone: decreased Gait & Station: unable to stand Patient leans: N/A  Psychiatric Specialty Exam: Physical Exam as per history and physical   ROSComplaining of right foot pain weakness of his right leg muscles and worried about losing his second toe secondary to osteomyelitis No Fever-chills, No Headache, No changes with Vision or hearing, reports vertigo No problems swallowing food or Liquids, No Chest pain, Cough or Shortness of Breath, No Abdominal pain, No Nausea or Vommitting, Bowel movements are regular, No Blood in stool or Urine, No dysuria, No new skin rashes or bruises, No  new joints pains-aches,  No new weakness, tingling, numbness in any extremity, No  recent weight gain or loss, No polyuria, polydypsia or polyphagia,   A full 10 point Review of Systems was done, except as stated above, all other Review of Systems were negative.  Blood pressure (!) 154/86, pulse (!) 113, temperature 98.8 F (37.1 C), temperature source Oral, resp. rate 16, height _0  (1.727 m), weight 72.1 kg (158 lb 15.2 oz), SpO2 95 %.Body mass index is 24.17 kg/m.  General Appearance: Guarded  Eye Contact:  Good  Speech:  Clear and Coherent  Volume:  Decreased  Mood:  Anxious and Depressed  Affect:  Depressed  Thought Process:  Coherent and Goal Directed  Orientation:  Full (Time, Place, and Person)  Thought Content:  Rumination  Suicidal Thoughts:  No  Homicidal Thoughts:  No  Memory:  Immediate;   Good Recent;   Fair Remote;   Fair  Judgement:  Fair  Insight:  Fair  Psychomotor Activity:  Decreased  Concentration:  Concentration: Good and Attention Span: Fair  Recall:  Good  Fund of Knowledge:  Good  Language:  Good  Akathisia:  Negative  Handed:  Right  AIMS (if indicated):     Assets:  Communication Skills Desire for Improvement Financial Resources/Insurance Housing Leisure Time Resilience Social Support Transportation  ADL's:  Impaired  Cognition:  WNL  Sleep:        Treatment Plan Summary: 66 years old male admitted with osteomyelitis of his right foot and also swelling and pain of right lower leg. Patient has history of polysubstance abuse. Patient has recent toe amputation secondary to osteomyelitis. Patient appeared to understand his medical problems and recommended medical treatment during this evaluation. Patient stated that he would preferred to save his toe if possible.   Case discussed with the LCSW and also tried hospitalist. Patient has no safety concerns.  Based on my evaluation patient has capacity to make his own medical decision and living  arrangements  Appreciate psychiatric consultation and we sign off as of today Please contact [phone] or [phone] if needs further assistance  Disposition: No evidence of imminent risk to self or others at present.   Supportive therapy provided about ongoing stressors.  Ambrose Finland, MD 07/16/2016 10:14 AM

## 2016-07-16 NOTE — Progress Notes (Signed)
MRI scan of foot indeterminate Continue antibiotics Follow-up with Dr.Xu or Dr. Sharol Given next week Nothing clearly surgically this time but it may develop into that type of problem in the weeks to come

## 2016-07-16 NOTE — Progress Notes (Signed)
PROGRESS NOTE    Calvin Mckee  PYK:[id number] DOB: 04-Sep-1950 DOA: 07/10/2016 PCP: Pcp Not In System  Brief Narrative:  Calvin Mckee is a 66 y.o. male with medical history significant of Recent Right 2nd toe Amputation (been on Abx with Keflex, Clindamycin, and Doxycycline), Tobacco Abuse, EtOH use, Polysubstance Abuse including recent Tobacco Abuse who presented with Right Foot pain that worsened over the last 2 days. Patient states that his leg started swelling 3 days ago and that he was not able to walk on it and put any pressure on it since yesterday. Patient recently had a 2nd Right Toe Amputation on March 6 at [facility] in [place] and recently saw Dr. Erlinda Hong at [facility] 07/06/16. States that he ran out of his pain meds and that toe and foot worsened over the last two days and now the pain is unbearable. No nausea, vomiting, chest pain or SOB. [facility] was called to admit patient for Cellulitis and Suspected Osteomyelitis. Consulted Orthopedics Dr. Marlou Sa who is Awaiting MRI to evaluate foot for further evaluation. This is still to be done as patient not able to sit still to do examination and was brought down yesterday and unable to get diagnostic images. Patient unable to sit still today during examination and states pain is unbearable and was not able to be evaluated by PT due to pain. Given additional Morphine today and will likely need anxiolytic if MRI is attempted again.   Assessment & Plan:   Principal Problem:   Osteomyelitis ([place]) Active Problems:   Hypokalemia   Pain and swelling of right lower leg   Alcohol use   Polysubstance abuse   Tobacco use disorder   Cellulitis and abscess of toe of right foot  Right Foot Second Toe Ulceration and Cellulitis with Concern for Right Foot Osteomyelitis /sepsis presented on admission -Failed Outpatient Treatment with Keflex, Clindamycin, and Doxycycline and had operation on March 6th -Patient was Febrile today at 101 and  Tachycardic and Tachypenic; WBC 14.8 on admission, meet sepsis criteria, blood culture no growth, urine culture no growth, cxr with minimal atelectasis, no acute infiltrate, he has palpable pedal pulse, --Foot X-Ray showed Prior amputation of RIGHT second toe through the proximal phalanx with poor definition of the osseous stump, could represent resorption secondary to resection but osteomyelitis is not excluded. -- Vascular Duplex showed that the RLE is Negative for DVT and no evidence of Right Bakers Cyst but there was an incidental finding of a hetrerogenous area of the Right Groin suggestive of a possible Prominent inguinal Lymph node -- [place] [facility] consulted after mri resulted on 4/6 -Wound Care Nurse Consultation appreciated -C/w Broad Spectrum Abx with IV Vanc and IV Cefepime with Pharmacy to dose, S/p total of 60 mg of IV Lasix   Hyponatremia,  -Low Sodium possible from [employer] Potomania  -serum/urine osmo/sodium suggest SIADH, am cortisol level and tsh wnl, low serum uric acid also support siadh,  - start fluids restriction on 4/5 Sodium improved  Hypokalemia, replaced,  mag 2.  Accelerated Hypertension -Likely from Pain and Agitation and mild Fever -C/w Amlodipine 10 mg po Daily, h/o cocaine use, will need to avoid betablocker -IV Hydralazine 10 mg q4hprn for SBP >180 or DBP >100 -Continue Pain Control and Acetaminophen for Fever  EtOH Use Concern for Withdrawal of Alcohol and Opiates -Patient states he drinks at leas 2 beers Daily -CIWA Protocol with Lorazepam -C/w MVI, Folic Acid and Thiamine -C/w Percocet q8hprn and Morphine 2 mg q4hprn  Tobacco Abuse -Nicotine Patch increase to 21mg  ,  -Smoking Cessation Counseling Given  Hx of Polysubstance Abuse --Has Hx of Cocaine Abuse and states he did smoked Crack Cocaine 2 weeks ago. Use percocet from the street,  Patient is has intermittent agitation, threatening to leave AMA at time, suspect pain, cigarette  craving, alcohol withdrawal? He is oriented x3, will try to increase prn pain meds, increase nicotine dose, add prn klonopin, psych consulted , patient is deemed to have  capacity to make medical decision   DVT prophylaxis: Lovenox 40 mg sq Code Status: FULL CODE Family Communication: No Family present  Disposition Plan: SNF;  Consultants:   Orthopedic Surgery Dr. Marlou Sa    Procedures: MRI of Right Foot still pending   Antimicrobials:  Anti-infectives    Start     Dose/Rate Route Frequency Ordered Stop   07/15/16 1000  vancomycin (VANCOCIN) 1,500 mg in sodium chloride 0.9 % 500 mL IVPB     1,500 mg 250 mL/hr over 120 Minutes Intravenous Every 12 hours 07/15/16 0637     07/14/16 0500  vancomycin (VANCOCIN) IVPB 1000 mg/200 mL premix  Status:  Discontinued     1,000 mg 200 mL/hr over 60 Minutes Intravenous Every 8 hours 07/13/16 1907 07/15/16 0637   07/13/16 2200  ceFEPIme (MAXIPIME) 2 g in dextrose 5 % 50 mL IVPB     2 g 100 mL/hr over 30 Minutes Intravenous Every 12 hours 07/13/16 1258     07/13/16 1400  ceFEPIme (MAXIPIME) 1 g in dextrose 5 % 50 mL IVPB     1 g 100 mL/hr over 30 Minutes Intravenous Every 8 hours 07/13/16 1258 07/13/16 1359   07/10/16 2200  ceFEPIme (MAXIPIME) 1 g in dextrose 5 % 50 mL IVPB  Status:  Discontinued     1 g 100 mL/hr over 30 Minutes Intravenous Every 8 hours 07/10/16 1830 07/13/16 1258   07/10/16 1830  vancomycin (VANCOCIN) 1,500 mg in sodium chloride 0.9 % 500 mL IVPB  Status:  Discontinued     1,500 mg 250 mL/hr over 120 Minutes Intravenous Every 12 hours 07/10/16 1826 07/13/16 1907   07/10/16 1700  cefTRIAXone (ROCEPHIN) 2 g in dextrose 5 % 50 mL IVPB  Status:  Discontinued     2 g 100 mL/hr over 30 Minutes Intravenous Every 24 hours 07/10/16 1655 07/10/16 1826   07/10/16 1700  metroNIDAZOLE (FLAGYL) tablet 500 mg  Status:  Discontinued     500 mg Oral Every 8 hours 07/10/16 1655 07/10/16 1826     Subjective: Right foot pain and swelling  seems improving,  Last fever on 4/3 2pm, He is aaox3 today, but with intermittent agitation  Objective: Vitals:   07/15/16 1259 07/15/16 2103 07/16/16 0500 07/16/16 0600  BP: (!) 177/89 (!) 159/81  (!) 154/86  Pulse: (!) 109 100  (!) 113  Resp: 16 16  16   Temp: 98.4 F (36.9 C) 99.1 F (37.3 C)  98.8 F (37.1 C)  TempSrc: Oral Oral  Oral  SpO2: 96% 99%  95%  Weight:   72.1 kg (158 lb 15.2 oz)   Height:        Intake/Output Summary (Last 24 hours) at 07/16/16 0840 Last data filed at 07/16/16 0831  Gross per 24 hour  Intake             1230 ml  Output             1400 ml  Net             -  170 ml   Filed Weights   07/14/16 0510 07/15/16 0403 07/16/16 0500  Weight: 75.1 kg (165 lb 9.1 oz) 74 kg (163 lb 2.3 oz) 72.1 kg (158 lb 15.2 oz)   Examination: Physical Exam:  Constitutional:  aaox3, intermittent agitation Eyes: Lids and conjunctivae normal, sclerae anicteric  ENMT: External Ears, Nose appear normal. Grossly normal hearing Neck: Appears normal, supple, no cervical masses, normal ROM, no appreciable thyromegaly, no JVD Respiratory: Clear to auscultation bilaterally, no wheezing, rales, rhonchi or crackles. Normal respiratory effort and patient is not tachypenic. No accessory muscle use.  Cardiovascular: Slightly tachycardic, no murmurs / rubs / gallops. S1 and S2 auscultated. Trace Right Leg Lower Extremity Edema improved warmth and some erythema but still has some Pedal Edema; Difficult to palpate Right pedal pulse because of swelling  Abdomen: Soft, non-tender, non-distended. No masses palpated. No appreciable hepatosplenomegaly. Bowel sounds positive.  GU: Deferred. Musculoskeletal: No clubbing / cyanosis of digits/nails. Right toe ulceration less warmth and erythema Skin: Right 2nd toe ulceration and pedal swelling Neurologic: CN 2-12 grossly intact with no focal deficits.  Romberg sign cerebellar reflexes not assessed.  Psychiatric: Normal judgment and insight. Alert  and awake. Agitated and Anxious mood and inappropriate affect.   Labs on Admission: I have personally reviewed following labs and imaging studies  Data Reviewed: I have personally reviewed following labs and imaging studies  CBC:  Recent Labs Lab 07/11/16 0346 07/12/16 0450 07/13/16 0552 07/14/16 0548 07/15/16 0541 07/16/16 0611  WBC 14.8* 13.1* 12.6* 12.2* 11.4* 11.7*  NEUTROABS 11.7* 9.2* 9.0* 8.0*  --  8.2*  HGB 12.4* 11.0* 11.4* 9.8* 10.5* 11.4*  HCT 35.2* 31.0* 32.7* 28.2* 29.5* 32.0*  MCV 87.6 87.8 87.7 84.7 83.8 84.4  PLT 523* 389 424* 352 408* 226*   Basic Metabolic Panel:  Recent Labs Lab 07/11/16 0346 07/12/16 0450 07/13/16 0552 07/14/16 0548 07/15/16 0541 07/16/16 0611  NA 132* 133* 129* 129* 124* 133*  K 3.5 3.6 3.7 3.6 3.9 4.1  CL 94* 97* 93* 93* 91* 96*  CO2 26 28 26 28 24 26   GLUCOSE 73 107* 112* 106* 108* 103*  BUN 10 9 8 7 6 7   CREATININE 0.73 0.62 0.58*  0.64 0.64 0.61 0.67  CALCIUM 9.3 8.9 9.1 8.6* 8.9 9.4  MG 1.8 1.8 1.8 1.9 2.0  --   PHOS 3.7 2.6 3.6 4.0  --   --    GFR: Estimated Creatinine Clearance: 91.4 mL/min (by C-G formula based on SCr of 0.67 mg/dL). Liver Function Tests:  Recent Labs Lab 07/11/16 0346 07/12/16 0450 07/13/16 0552 07/14/16 0548  AST 26 24 23 19   ALT 20 18 22 21   ALKPHOS 71 61 63 53  BILITOT 1.0 0.7 0.6 0.7  PROT 8.1 7.0 7.7 7.5  ALBUMIN 3.3* 2.9* 3.0* 2.8*   No results for input(s): LIPASE, AMYLASE in the last 168 hours. No results for input(s): AMMONIA in the last 168 hours. Coagulation Profile: No results for input(s): INR, PROTIME in the last 168 hours. Cardiac Enzymes: No results for input(s): CKTOTAL, CKMB, CKMBINDEX, TROPONINI in the last 168 hours. BNP (last 3 results) No results for input(s): PROBNP in the last 8760 hours. HbA1C: No results for input(s): HGBA1C in the last 72 hours. CBG:  Recent Labs Lab 07/12/16 0732 07/13/16 0725 07/14/16 0722 07/15/16 0820 07/16/16 0730  GLUCAP  125* 120* 183* 108* 124*   Lipid Profile: No results for input(s): CHOL, HDL, LDLCALC, TRIG, CHOLHDL, LDLDIRECT in the last 72 hours.  Thyroid Function Tests: No results for input(s): TSH, T4TOTAL, FREET4, T3FREE, THYROIDAB in the last 72 hours. Anemia Panel: No results for input(s): VITAMINB12, FOLATE, FERRITIN, TIBC, IRON, RETICCTPCT in the last 72 hours. Sepsis Labs: No results for input(s): PROCALCITON, LATICACIDVEN in the last 168 hours.  Recent Results (from the past 240 hour(s))  Blood Cultures x 2 sites     Status: None   Collection Time: 07/10/16  3:50 PM  Result Value Ref Range Status   Specimen Description BLOOD RIGHT ANTECUBITAL  Final   Special Requests BOTTLES DRAWN AEROBIC AND ANAEROBIC 10CC  Final   Culture   Final    NO GROWTH 5 DAYS Performed at [facility], [place] [address], [place], [place] [address]    Report Status 07/15/2016 FINAL  Final  Blood Cultures x 2 sites     Status: None   Collection Time: 07/10/16  3:51 PM  Result Value Ref Range Status   Specimen Description BLOOD LEFT FOREARM  Final   Special Requests BOTTLES DRAWN AEROBIC AND ANAEROBIC 5ML  Final   Culture   Final    NO GROWTH 5 DAYS Performed at [facility], [place] [address], [place],  [address]    Report Status 07/15/2016 FINAL  Final  Urine culture     Status: None   Collection Time: 07/10/16  7:15 PM  Result Value Ref Range Status   Specimen Description URINE, CLEAN CATCH  Final   Special Requests NONE  Final   Culture   Final    NO GROWTH Performed at [facility], [place] [address], [place],  [address]    Report Status 07/12/2016 FINAL  Final     Radiology Studies: Mr Foot Right Wo Contrast  Result Date: 07/15/2016 CLINICAL DATA:  Right foot pain worsening since Saturday. Leg started swelling and patient has been on unable to walk nor put any pressure on it. EXAM: MRI OF THE RIGHT FOREFOOT WITHOUT CONTRAST TECHNIQUE: Multiplanar, multisequence MR imaging  of the right forefoot was performed. No intravenous contrast was administered. COMPARISON:  Radiographs of the right foot from 07/15/2016 FINDINGS: Despite patient having received the maximum amount of medication, the study is markedly limited due to motion artifacts. Bones/Joint/Cartilage The patient is status post amputation at the base of the right proximal phalanx with suggestion of T1 hypointensity in T2 fat sat hyperintensity at site of amputation. Findings are suspicious for osteomyelitis. Ligaments Suboptimally assessed Muscles and Tendons Suboptimally assessed Soft tissues Soft tissue edema about the forefoot special along the plantar aspect of the first and second digits. IMPRESSION: Markedly compromised study due to patient motion artifacts. Marrow signal abnormalities at the amputation site of the base of the second proximal phalanx with surrounding soft tissue inflammation/edema raise concern for the presence of osteomyelitis at the stump. Electronically Signed   By: Ashley Royalty M.D.   On: 07/15/2016 20:08   Dg Chest Port 1 View  Result Date: 07/14/2016 CLINICAL DATA:  Fever with right foot infection EXAM: PORTABLE CHEST 1 VIEW COMPARISON:  02/11/2010 FINDINGS: Surgical wire at the lower cervical spine. Minimal atelectasis right base. No consolidation or effusion. Heart size upper normal. Minimal atherosclerosis. No pneumothorax. IMPRESSION: Minimal atelectasis at the right base. No acute infiltrate or edema. Electronically Signed   By: Donavan Foil M.D.   On: 07/14/2016 18:26   Scheduled Meds: . amLODipine  10 mg Oral Daily  . ceFEPime (MAXIPIME) IV  2 g Intravenous Q12H  . cholecalciferol  1,000 Units Oral BID  . enoxaparin (LOVENOX) injection  40 mg Subcutaneous Q24H  . folic acid  1 mg Oral Daily  . LORazepam  2 mg Intravenous Once  . multivitamin with minerals  1 tablet Oral Daily  . nicotine  21 mg Transdermal Daily  . polyethylene glycol  17 g Oral BID  . senna-docusate  1 tablet  Oral BID  . sodium chloride  1 g Oral BID WC  . thiamine  100 mg Oral Daily  . vancomycin  1,500 mg Intravenous Q12H   Continuous Infusions:    LOS: 6 days    Time spent> 16mins  Alla Sloma, MD PhD [facility] Pager [phone]  If 7PM-7AM, please contact night-coverage [email or web address] Password [email or web address] 07/16/2016, 8:40 AM

## 2016-07-17 LAB — BASIC METABOLIC PANEL
Anion gap: 9 (ref 5–15)
BUN: 9 mg/dL (ref 6–20)
CHLORIDE: 95 mmol/L — AB (ref 101–111)
CO2: 24 mmol/L (ref 22–32)
CREATININE: 0.61 mg/dL (ref 0.61–1.24)
Calcium: 9 mg/dL (ref 8.9–10.3)
GFR calc Af Amer: >60 mL/min (ref >60)
GLUCOSE: 118 mg/dL — AB (ref 65–99)
Potassium: 3.7 mmol/L (ref 3.5–5.1)
SODIUM: 128 mmol/L — AB (ref 135–145)

## 2016-07-17 LAB — GLUCOSE, CAPILLARY: GLUCOSE-CAPILLARY: 115 mg/dL — AB (ref 65–99)

## 2016-07-17 MED ORDER — DOXYCYCLINE HYCLATE 100 MG PO CAPS: 100.0000 mg | ORAL_CAPSULE | Freq: Two times a day (BID) | ORAL | 0 refills | Status: DC

## 2016-07-17 MED ORDER — CLONAZEPAM 0.5 MG PO TABS: 0.2500 mg | ORAL_TABLET | Freq: Three times a day (TID) | ORAL | 0 refills | Status: DC | PRN

## 2016-07-17 MED ORDER — SACCHAROMYCES BOULARDII 250 MG PO CAPS: 250.0000 mg | ORAL_CAPSULE | Freq: Two times a day (BID) | ORAL | 0 refills | Status: AC

## 2016-07-17 MED ORDER — CARVEDILOL 6.25 MG PO TABS: 6.2500 mg | ORAL_TABLET | Freq: Two times a day (BID) | ORAL | 0 refills | Status: DC

## 2016-07-17 MED ORDER — MAGNESIUM OXIDE 400 MG PO TABS: 400.0000 mg | ORAL_TABLET | Freq: Every day | ORAL | 0 refills | Status: AC

## 2016-07-17 MED ORDER — CIPROFLOXACIN HCL 500 MG PO TABS: 500.0000 mg | ORAL_TABLET | Freq: Two times a day (BID) | ORAL | 0 refills | Status: DC

## 2016-07-17 MED ORDER — POLYETHYLENE GLYCOL 3350 17 G PO PACK: 17.0000 g | PACK | Freq: Two times a day (BID) | ORAL | 0 refills | Status: DC

## 2016-07-17 MED ORDER — AMLODIPINE BESYLATE 10 MG PO TABS: 10.0000 mg | ORAL_TABLET | Freq: Every day | ORAL | 0 refills | Status: DC

## 2016-07-17 MED ORDER — THIAMINE HCL 100 MG PO TABS: 100.0000 mg | ORAL_TABLET | Freq: Every day | ORAL | 0 refills | Status: DC

## 2016-07-17 MED ORDER — SODIUM CHLORIDE 1 G PO TABS: 1.0000 g | ORAL_TABLET | Freq: Two times a day (BID) | ORAL | 0 refills | Status: DC

## 2016-07-17 MED ORDER — OXYCODONE-ACETAMINOPHEN 5-325 MG PO TABS: 1.0000 | ORAL_TABLET | Freq: Three times a day (TID) | ORAL | 0 refills | Status: DC | PRN

## 2016-07-17 MED ORDER — SENNOSIDES-DOCUSATE SODIUM 8.6-50 MG PO TABS: 1.0000 | ORAL_TABLET | Freq: Every day | ORAL | 0 refills | Status: DC

## 2016-07-17 NOTE — Discharge Summary (Signed)
Discharge Summary  Calvin Mckee:[id number] DOB: 04-Sep-1950  PCP: Pcp Not In System  Admit date: 07/10/2016 Discharge date: 07/17/2016  Time spent: >16mins  Recommendations for Outpatient Follow-up:  1. F/u with SNF MD for hospital discharge follow up, repeat cbc/bmp at follow up 2. Follow up with [facility] in one week  Discharge Diagnoses:  Active Hospital Problems   Diagnosis Date Noted  . Osteomyelitis ([place]) 07/10/2016  . Hypokalemia 07/10/2016  . Pain and swelling of right lower leg 07/10/2016  . Alcohol use 07/10/2016  . Polysubstance abuse 07/10/2016  . Tobacco use disorder 07/10/2016  . Cellulitis and abscess of toe of right foot 07/10/2016    Resolved Hospital Problems   Diagnosis Date Noted Date Resolved  No resolved problems to display.    Discharge Condition: stable  Diet recommendation: heart healthy  Filed Weights   07/15/16 0403 07/16/16 0500 07/17/16 0535  Weight: 74 kg (163 lb 2.3 oz) 72.1 kg (158 lb 15.2 oz) 74.5 kg (164 lb 3.9 oz)    History of present illness:  Patient coming from: Home  Chief Complaint: Right Foot Pain  HPI: Calvin Mckee is a 66 y.o. male with medical history significant of Recent Right 2nd toe Amputation (been on Abx with Keflex, Clindamycin, and Doxycycline), Tobacco Abuse, EtOH use, Polysubstance Abuse including recent Tobacco Abuse who presented with Right Foot pain that worsened over the last 2 days. Patient states that his leg started swelling 3 days ago and that he was not able to walk on it and put any pressure on it since yesterday. Patient recently had a 2nd Right Toe Amputation on March 6 at [facility] in [place] and recently saw Dr. Erlinda Hong at [facility] 07/06/16. States that he ran out of his pain meds and that toe and foot worsened over the last two days and now the pain is unbearable. No nausea, vomiting, chest pain or SOB. [facility] was called to admit patient for Cellulitis and Suspected  Osteomyelitis.   ED Course: Was given a liter of fluid, given IV Abx, had bloodwork done and a Foot X-Ray suspicious for osteomyelitis.   Hospital Course:  Principal Problem:   Osteomyelitis ([place]) Active Problems:   Hypokalemia   Pain and swelling of right lower leg   Alcohol use   Polysubstance abuse   Tobacco use disorder   Cellulitis and abscess of toe of right foot  Right Foot Second Toe Ulceration and Cellulitis with Concern for Right Foot Osteomyelitis /sepsis presented on admission -Failed Outpatient Treatment with Keflex, Clindamycin, and Doxycycline and had operation on March 6th -Patient was Febrile today at 101 and Tachycardic and Tachypenic; WBC 14.8 on admission, meet sepsis criteria, blood culture no growth, urine culture no growth, cxr with minimal atelectasis, no acute infiltrate, he has palpable pedal pulse, --Foot X-Ray showed Prior amputation of RIGHT second toe through the proximal phalanx with poor definition of the osseous stump, could represent resorption secondary to resection but osteomyelitis is not excluded. -- Vascular Duplex showed that the RLE is Negative for DVT and no evidence of Right Bakers Cyst but there was an incidental finding of a hetrerogenous area of the Right Groin suggestive of a possible Prominent inguinal Lymph node -- -Wound Care Nurse Consultation appreciated -he received IV Vanc and IV Cefepime for 8 days in  The hospital , has been fever free for the last three days, wbc normalized, right foot edema has improved,  [place] consulted after mri resulted on 4/6 who  recommended continue abx and outpatient follow up with [facility] next week. Patient is discharged on doxycycline and cipro.  Hyponatremia,  -Low Sodium possible from [employer]  -serum/urine osmo/sodium suggest SIADH, am cortisol level and tsh wnl, low serum uric acid also support siadh,  - start fluids restriction and salt tabs on 4/5  Sodium  improved  Hypokalemia, replaced,  mag 2.  Accelerated Hypertension -Likely from Pain and Agitation and mild Fever -C/w Amlodipine 10 mg po Daily, h/o cocaine use, will need to avoid betablocker   EtOH Use Concern for Withdrawal of Alcohol and Opiates -Patient states he drinks at leas 2 beers Daily -CIWA Protocol with Lorazepam -C/w MVI, Folic Acid and Thiamine -C/w Percocet q8hprn and Morphine 2 mg q4hprn   Tobacco Abuse -Nicotine Patch increase to 21mg  ,  -Smoking Cessation Counseling Given  Hx of Polysubstance Abuse --Has Hx of Cocaine Abuse and states he did smoked Crack Cocaine 2 weeks ago. Use percocet from the street,  Patient is has intermittent agitation, threatening to leave AMA at time, suspect pain, cigarette craving, alcohol withdrawal? He is oriented x3,  increase prn pain meds, increase nicotine dose, add prn klonopin, psych consulted , patient is deemed to have  capacity to make medical decision   DVT prophylaxis while in the hospital : Lovenox 40 mg sq Code Status: FULL CODE Family Communication: No Family present  Disposition Plan: SNF;  Consultants:   Orthopedic Surgery Dr. Marlou Sa    Procedures: MRI of Right Foot still pending   Antimicrobials:  vanc/cefepime   Discharge Exam: BP (!) 156/98 (BP Location: Right Arm) Comment: pt wont keep arm still  Pulse 90   Temp 98.8 F (37.1 C) (Oral)   Resp 20   Ht 5\' 8"  (1.727 m)   Wt 74.5 kg (164 lb 3.9 oz) Comment: pt. wearing street clothes and shoes  SpO2 95%   BMI 24.97 kg/m   General: aaox3 Cardiovascular: rrr Respiratory: CTABL Extremity: right foot erythema and edema is improving  Discharge Instructions You were cared for by a hospitalist during your hospital stay. If you have any questions about your discharge medications or the care you received while you were in the hospital after you are discharged, you can call the unit and asked to speak with the hospitalist on call if the  hospitalist that took care of you is not available. Once you are discharged, your primary care physician will handle any further medical issues. Please note that NO REFILLS for any discharge medications will be authorized once you are discharged, as it is imperative that you return to your primary care physician (or establish a relationship with a primary care physician if you do not have one) for your aftercare needs so that they can reassess your need for medications and monitor your lab values.  Discharge Instructions    Diet - low sodium heart healthy    Complete by:  As directed    Increase activity slowly    Complete by:  As directed      Allergies as of 07/17/2016   No Known Allergies     Medication List    TAKE these medications   amLODipine 10 MG tablet Commonly known as:  NORVASC Take 1 tablet (10 mg total) by mouth daily.   cholecalciferol 1000 units tablet Commonly known as:  VITAMIN D Take 1,000 Units by mouth 2 (two) times daily.   ciprofloxacin 500 MG tablet Commonly known as:  CIPRO Take 1 tablet (500 mg  total) by mouth 2 (two) times daily.   clonazePAM 0.5 MG tablet Commonly known as:  KLONOPIN Take 0.5 tablets (0.25 mg total) by mouth 3 (three) times daily as needed (anxiety).   doxycycline 100 MG capsule Commonly known as:  VIBRAMYCIN Take 1 capsule (100 mg total) by mouth 2 (two) times daily.   ibuprofen 800 MG tablet Commonly known as:  ADVIL,MOTRIN Take 1 tablet (800 mg total) by mouth every 8 (eight) hours as needed.   magnesium oxide 400 MG tablet Commonly known as:  MAG-OX Take 1 tablet (400 mg total) by mouth daily.   multivitamin with minerals Tabs tablet Take 1 tablet by mouth daily.   oxyCODONE-acetaminophen 5-325 MG tablet Commonly known as:  PERCOCET/ROXICET Take 1 tablet by mouth every 8 (eight) hours as needed for moderate pain.   polyethylene glycol packet Commonly known as:  MIRALAX / GLYCOLAX Take 17 g by mouth 2 (two) times  daily.   saccharomyces boulardii 250 MG capsule Commonly known as:  FLORASTOR Take 1 capsule (250 mg total) by mouth 2 (two) times daily.   senna-docusate 8.6-50 MG tablet Commonly known as:  Senokot-S Take 1 tablet by mouth at bedtime.   sodium chloride 1 g tablet Take 1 tablet (1 g total) by mouth 2 (two) times daily with a meal.   thiamine 100 MG tablet Take 1 tablet (100 mg total) by mouth daily.      No Known Allergies Follow-up Information    Eduard Roux, MD Follow up in 1 week(s).   Specialty:  Orthopedic Surgery Why:  Call office for an appointment to be seen as soon as possible for foot post amputation care. Contact information: [place] [id number] [phone]            The results of significant diagnostics from this hospitalization (including imaging, microbiology, ancillary and laboratory) are listed below for reference.    Significant Diagnostic Studies: Mr Foot Right Wo Contrast  Result Date: 07/15/2016 CLINICAL DATA:  Right foot pain worsening since Saturday. Leg started swelling and patient has been on unable to walk nor put any pressure on it. EXAM: MRI OF THE RIGHT FOREFOOT WITHOUT CONTRAST TECHNIQUE: Multiplanar, multisequence MR imaging of the right forefoot was performed. No intravenous contrast was administered. COMPARISON:  Radiographs of the right foot from 07/15/2016 FINDINGS: Despite patient having received the maximum amount of medication, the study is markedly limited due to motion artifacts. Bones/Joint/Cartilage The patient is status post amputation at the base of the right proximal phalanx with suggestion of T1 hypointensity in T2 fat sat hyperintensity at site of amputation. Findings are suspicious for osteomyelitis. Ligaments Suboptimally assessed Muscles and Tendons Suboptimally assessed Soft tissues Soft tissue edema about the forefoot special along the plantar aspect of the first and second digits. IMPRESSION:  Markedly compromised study due to patient motion artifacts. Marrow signal abnormalities at the amputation site of the base of the second proximal phalanx with surrounding soft tissue inflammation/edema raise concern for the presence of osteomyelitis at the stump. Electronically Signed   By: Ashley Royalty M.D.   On: 07/15/2016 20:08   Dg Chest Port 1 View  Result Date: 07/14/2016 CLINICAL DATA:  Fever with right foot infection EXAM: PORTABLE CHEST 1 VIEW COMPARISON:  02/11/2010 FINDINGS: Surgical wire at the lower cervical spine. Minimal atelectasis right base. No consolidation or effusion. Heart size upper normal. Minimal atherosclerosis. No pneumothorax. IMPRESSION: Minimal atelectasis at the right base. No acute infiltrate or edema. Electronically Signed  By: Donavan Foil M.D.   On: 07/14/2016 18:26   Ap / Lateral X-ray Right Foot  Result Date: 07/10/2016 CLINICAL DATA:  RIGHT foot pain, recent amputation of second toe in February, pain and that this toe EXAM: RIGHT FOOT - 2 VIEW COMPARISON:  05/12/2016 FINDINGS: Interval amputation of second toe through the proximal phalanx. Poorly defined osseous stump at the proximal phalanx, may related to resorption secondary to amputation though infection is not excluded with this appearance. Mild soft tissue swelling at the stump in more diffusely at the dorsum of the entire RIGHT foot. Joint space narrowing at first MTP joint. Remaining joint spaces preserved. No acute fracture, dislocation or additional bone destruction. IMPRESSION: Prior amputation of RIGHT second toe through the proximal phalanx with poor definition of the osseous stump, could represent resorption secondary to resection but osteomyelitis is not excluded. If there is persistent clinical concern for osteomyelitis or soft tissue abscess in the RIGHT foot, consider MR. Electronically Signed   By: Lavonia Dana M.D.   On: 07/10/2016 16:19    Microbiology: Recent Results (from the past 240 hour(s))   Blood Cultures x 2 sites     Status: None   Collection Time: 07/10/16  3:50 PM  Result Value Ref Range Status   Specimen Description BLOOD RIGHT ANTECUBITAL  Final   Special Requests BOTTLES DRAWN AEROBIC AND ANAEROBIC 10CC  Final   Culture   Final    NO GROWTH 5 DAYS Performed at [facility], [address], [place], [place] [address]    Report Status 07/15/2016 FINAL  Final  Blood Cultures x 2 sites     Status: None   Collection Time: 07/10/16  3:51 PM  Result Value Ref Range Status   Specimen Description BLOOD LEFT FOREARM  Final   Special Requests BOTTLES DRAWN AEROBIC AND ANAEROBIC 5ML  Final   Culture   Final    NO GROWTH 5 DAYS Performed at [facility], [place] [address], [place], [place] [address]    Report Status 07/15/2016 FINAL  Final  Urine culture     Status: None   Collection Time: 07/10/16  7:15 PM  Result Value Ref Range Status   Specimen Description URINE, CLEAN CATCH  Final   Special Requests NONE  Final   Culture   Final    NO GROWTH Performed at [facility], [place] [address], [place], [place] [address]    Report Status 07/12/2016 FINAL  Final     Labs: Basic Metabolic Panel:  Recent Labs Lab 07/11/16 0346 07/12/16 0450 07/13/16 0552 07/14/16 0548 07/15/16 0541 07/16/16 0611 07/17/16 0552  NA 132* 133* 129* 129* 124* 133* 128*  K 3.5 3.6 3.7 3.6 3.9 4.1 3.7  CL 94* 97* 93* 93* 91* 96* 95*  CO2 26 28 26 28 24 26 24   GLUCOSE 73 107* 112* 106* 108* 103* 118*  BUN 10 9 8 7 6 7 9   CREATININE 0.73 0.62 0.58*  0.64 0.64 0.61 0.67 0.61  CALCIUM 9.3 8.9 9.1 8.6* 8.9 9.4 9.0  MG 1.8 1.8 1.8 1.9 2.0  --   --   PHOS 3.7 2.6 3.6 4.0  --   --   --    Liver Function Tests:  Recent Labs Lab 07/11/16 0346 07/12/16 0450 07/13/16 0552 07/14/16 0548  AST 26 24 23 19   ALT 20 18 22 21   ALKPHOS 71 61 63 53  BILITOT 1.0 0.7 0.6 0.7  PROT 8.1  7.0 7.7 7.5  ALBUMIN 3.3* 2.9* 3.0* 2.8*   No results for input(s): LIPASE, AMYLASE in the  last 168 hours. No results for input(s): AMMONIA in the last 168 hours. CBC:  Recent Labs Lab 07/11/16 0346 07/12/16 0450 07/13/16 0552 07/14/16 0548 07/15/16 0541 07/16/16 0611  WBC 14.8* 13.1* 12.6* 12.2* 11.4* 11.7*  NEUTROABS 11.7* 9.2* 9.0* 8.0*  --  8.2*  HGB 12.4* 11.0* 11.4* 9.8* 10.5* 11.4*  HCT 35.2* 31.0* 32.7* 28.2* 29.5* 32.0*  MCV 87.6 87.8 87.7 84.7 83.8 84.4  PLT 523* 389 424* 352 408* 471*   Cardiac Enzymes: No results for input(s): CKTOTAL, CKMB, CKMBINDEX, TROPONINI in the last 168 hours. BNP: BNP (last 3 results) No results for input(s): BNP in the last 8760 hours.  ProBNP (last 3 results) No results for input(s): PROBNP in the last 8760 hours.  CBG:  Recent Labs Lab 07/13/16 0725 07/14/16 0722 07/15/16 0820 07/16/16 0730 07/17/16 0742  GLUCAP 120* 183* 108* 124* 115*       Signed:  Amaliya Whitelaw MD, PhD  [facility] 07/17/2016, 11:56 AM

## 2016-07-17 NOTE — Clinical Social Work Placement (Addendum)
   CLINICAL SOCIAL WORK PLACEMENT  NOTE 07/17/16 - DISCHARGED TO [facility]ULANCE  Date:  07/17/2016  Patient Details  Name: Calvin Mckee MRN: [id number] Date of Birth: 1951-04-01  Clinical Social Work is seeking post-discharge placement for this patient at the [place] level of care (*CSW will initial, date and re-position this form in  chart as items are completed):  Yes   Patient/family provided with [facility] Work Department's list of facilities offering this level of care within the geographic area requested by the patient (or if unable, by the patient's family).  Yes   Patient/family informed of their freedom to choose among providers that offer the needed level of care, that participate in Medicare, Medicaid or managed care program needed by the patient, have an available bed and are willing to accept the patient.  Yes   Patient/family informed of [facility]'s ownership interest in [facility] and [facility], as well as of the fact that they are under no obligation to receive care at these facilities.  PASRR submitted to E[facility] on 07/15/16     PASRR number received on 07/15/16     Existing PASRR number confirmed on       FL2 transmitted to all facilities in geographic area requested by pt/family on 07/15/16     FL2 transmitted to all facilities within larger geographic area on       Patient informed that his/her managed care company has contracts with or will negotiate with certain facilities, including the following:        Yes   Patient/family informed of bed offers received.  Patient chooses bed at       Physician recommends and patient chooses bed at [facility]    Patient to be transferred to [facility] on 07/17/16.  Patient to be transferred to facility by Ambulance     Patient family notified on 07/17/16 of transfer.  Name of family member notified:  Mayank Teuscher by phone [phone])    PHYSICIAN       Additional Comment:    _______________________________________________ Sable Feil, LCSW 07/17/2016, 3:15 PM

## 2016-07-17 NOTE — Progress Notes (Addendum)
I agree with Dr. Randel Pigg assessment. Blood sugar level 132. A sitter with patient this AM. He wishes to be discharged. Continue antibiotics Follow-up with Dr.Xu or Dr. Sharol Given next week Orthopedically stable.

## 2016-07-20 ENCOUNTER — Encounter (HOSPITAL_COMMUNITY): Payer: Self-pay

## 2016-07-20 ENCOUNTER — Inpatient Hospital Stay (HOSPITAL_COMMUNITY)
Admit: 2016-07-20 | Discharge: 2016-07-20 | Disposition: A | Discharge status: Home / Self Care | Payer: Medicare Other | Attending: Internal Medicine | Admitting: Internal Medicine

## 2016-07-20 ENCOUNTER — Ambulatory Visit (INDEPENDENT_AMBULATORY_CARE_PROVIDER_SITE_OTHER): Payer: Medicare Other | Admitting: Orthopaedic Surgery

## 2016-07-20 ENCOUNTER — Inpatient Hospital Stay (HOSPITAL_COMMUNITY)
Admission: EM | Admit: 2016-07-20 | Discharge: 2016-07-29 | DRG: 240 | Disposition: A | Discharge status: Skilled Nursing Facility | Payer: Medicare Other | Attending: Nephrology | Admitting: Nephrology

## 2016-07-20 DIAGNOSIS — F1010 Alcohol abuse, uncomplicated: Secondary | ICD-10-CM | POA: Diagnosis present

## 2016-07-20 DIAGNOSIS — I96 Gangrene, not elsewhere classified: Secondary | ICD-10-CM | POA: Diagnosis present

## 2016-07-20 DIAGNOSIS — L03115 Cellulitis of right lower limb: Secondary | ICD-10-CM | POA: Diagnosis present

## 2016-07-20 DIAGNOSIS — L97419 Non-pressure chronic ulcer of right heel and midfoot with unspecified severity: Secondary | ICD-10-CM | POA: Diagnosis not present

## 2016-07-20 DIAGNOSIS — F17210 Nicotine dependence, cigarettes, uncomplicated: Secondary | ICD-10-CM | POA: Diagnosis present

## 2016-07-20 DIAGNOSIS — D62 Acute posthemorrhagic anemia: Secondary | ICD-10-CM | POA: Diagnosis not present

## 2016-07-20 DIAGNOSIS — F1410 Cocaine abuse, uncomplicated: Secondary | ICD-10-CM | POA: Diagnosis present

## 2016-07-20 DIAGNOSIS — I10 Essential (primary) hypertension: Secondary | ICD-10-CM | POA: Diagnosis not present

## 2016-07-20 DIAGNOSIS — M869 Osteomyelitis, unspecified: Secondary | ICD-10-CM | POA: Diagnosis present

## 2016-07-20 DIAGNOSIS — I70261 Atherosclerosis of native arteries of extremities with gangrene, right leg: Secondary | ICD-10-CM | POA: Diagnosis not present

## 2016-07-20 DIAGNOSIS — Z79899 Other long term (current) drug therapy: Secondary | ICD-10-CM | POA: Diagnosis not present

## 2016-07-20 DIAGNOSIS — Z981 Arthrodesis status: Secondary | ICD-10-CM | POA: Diagnosis not present

## 2016-07-20 DIAGNOSIS — E222 Syndrome of inappropriate secretion of antidiuretic hormone: Secondary | ICD-10-CM

## 2016-07-20 DIAGNOSIS — D649 Anemia, unspecified: Secondary | ICD-10-CM

## 2016-07-20 DIAGNOSIS — M1990 Unspecified osteoarthritis, unspecified site: Secondary | ICD-10-CM | POA: Diagnosis present

## 2016-07-20 DIAGNOSIS — R451 Restlessness and agitation: Secondary | ICD-10-CM | POA: Diagnosis not present

## 2016-07-20 DIAGNOSIS — L97519 Non-pressure chronic ulcer of other part of right foot with unspecified severity: Secondary | ICD-10-CM | POA: Diagnosis present

## 2016-07-20 DIAGNOSIS — L03031 Cellulitis of right toe: Secondary | ICD-10-CM | POA: Diagnosis present

## 2016-07-20 DIAGNOSIS — S88019A Complete traumatic amputation at knee level, unspecified lower leg, initial encounter: Secondary | ICD-10-CM | POA: Diagnosis not present

## 2016-07-20 DIAGNOSIS — M8600 Acute hematogenous osteomyelitis, unspecified site: Secondary | ICD-10-CM | POA: Diagnosis not present

## 2016-07-20 DIAGNOSIS — R52 Pain, unspecified: Secondary | ICD-10-CM | POA: Diagnosis not present

## 2016-07-20 DIAGNOSIS — D473 Essential (hemorrhagic) thrombocythemia: Secondary | ICD-10-CM | POA: Diagnosis present

## 2016-07-20 DIAGNOSIS — Z89511 Acquired absence of right leg below knee: Secondary | ICD-10-CM | POA: Diagnosis not present

## 2016-07-20 DIAGNOSIS — L089 Local infection of the skin and subcutaneous tissue, unspecified: Secondary | ICD-10-CM | POA: Diagnosis not present

## 2016-07-20 DIAGNOSIS — M8660 Other chronic osteomyelitis, unspecified site: Secondary | ICD-10-CM | POA: Diagnosis not present

## 2016-07-20 DIAGNOSIS — M79671 Pain in right foot: Secondary | ICD-10-CM | POA: Diagnosis present

## 2016-07-20 LAB — CBC WITH DIFFERENTIAL/PLATELET
BASOS ABS: 0.1 10*3/uL (ref 0.0–0.1)
BASOS PCT: 1 %
Eosinophils Absolute: 0.2 10*3/uL (ref 0.0–0.7)
Eosinophils Relative: 2 %
HEMATOCRIT: 31.5 % — AB (ref 39.0–52.0)
HEMOGLOBIN: 11.2 g/dL — AB (ref 13.0–17.0)
Lymphocytes Relative: 24 %
Lymphs Abs: 2 10*3/uL (ref 0.7–4.0)
MCH: 30.2 pg (ref 26.0–34.0)
MCHC: 35.6 g/dL (ref 30.0–36.0)
MCV: 84.9 fL (ref 78.0–100.0)
MONOS PCT: 10 %
Monocytes Absolute: 0.8 10*3/uL (ref 0.1–1.0)
NEUTROS ABS: 5.2 10*3/uL (ref 1.7–7.7)
NEUTROS PCT: 63 %
Platelets: 643 10*3/uL — ABNORMAL HIGH (ref 150–400)
RBC: 3.71 MIL/uL — ABNORMAL LOW (ref 4.22–5.81)
RDW: 12.9 % (ref 11.5–15.5)
WBC: 8.1 10*3/uL (ref 4.0–10.5)

## 2016-07-20 LAB — BASIC METABOLIC PANEL
ANION GAP: 10 (ref 5–15)
BUN: 8 mg/dL (ref 6–20)
CALCIUM: 9.2 mg/dL (ref 8.9–10.3)
CHLORIDE: 98 mmol/L — AB (ref 101–111)
CO2: 25 mmol/L (ref 22–32)
Creatinine, Ser: 0.72 mg/dL (ref 0.61–1.24)
GFR calc non Af Amer: >60 mL/min (ref >60)
Glucose, Bld: 89 mg/dL (ref 65–99)
Potassium: 4 mmol/L (ref 3.5–5.1)
Sodium: 133 mmol/L — ABNORMAL LOW (ref 135–145)

## 2016-07-20 LAB — CBC
HEMATOCRIT: 30.3 % — AB (ref 39.0–52.0)
HEMOGLOBIN: 10.4 g/dL — AB (ref 13.0–17.0)
MCH: 29.1 pg (ref 26.0–34.0)
MCHC: 34.3 g/dL (ref 30.0–36.0)
MCV: 84.6 fL (ref 78.0–100.0)
PLATELETS: 669 10*3/uL — AB (ref 150–400)
RBC: 3.58 MIL/uL — AB (ref 4.22–5.81)
RDW: 13 % (ref 11.5–15.5)
WBC: 8 10*3/uL (ref 4.0–10.5)

## 2016-07-20 LAB — MRSA PCR SCREENING: MRSA BY PCR: NEGATIVE

## 2016-07-20 LAB — CREATININE, SERUM
CREATININE: 0.64 mg/dL (ref 0.61–1.24)
GFR calc non Af Amer: >60 mL/min (ref >60)

## 2016-07-20 MED ORDER — SODIUM CHLORIDE 0.9% FLUSH
3.0000 mL | Freq: Two times a day (BID) | INTRAVENOUS | Status: DC
  Administered 2016-07-20 – 2016-07-28 (×9): 3 mL via INTRAVENOUS

## 2016-07-20 MED ORDER — MORPHINE SULFATE (PF) 2 MG/ML IV SOLN
2.0000 mg | INTRAVENOUS | Status: DC | PRN
  Administered 2016-07-20 – 2016-07-28 (×33): 2 mg via INTRAVENOUS
  Filled 2016-07-20 (×32): qty 1

## 2016-07-20 MED ORDER — SENNOSIDES-DOCUSATE SODIUM 8.6-50 MG PO TABS
2.0000 | ORAL_TABLET | Freq: Every day | ORAL | Status: DC
  Administered 2016-07-20 – 2016-07-27 (×7): 2 via ORAL
  Filled 2016-07-20 (×8): qty 2

## 2016-07-20 MED ORDER — ENOXAPARIN SODIUM 40 MG/0.4ML ~~LOC~~ SOLN
40.0000 mg | SUBCUTANEOUS | Status: DC
  Administered 2016-07-20 – 2016-07-26 (×7): 40 mg via SUBCUTANEOUS
  Filled 2016-07-20 (×8): qty 0.4

## 2016-07-20 MED ORDER — VANCOMYCIN HCL IN DEXTROSE 1-5 GM/200ML-% IV SOLN
1000.0000 mg | Freq: Once | INTRAVENOUS | Status: AC
  Administered 2016-07-20: 1000 mg via INTRAVENOUS
  Filled 2016-07-20: qty 200

## 2016-07-20 MED ORDER — DEXTROSE 5 % IV SOLN
1.0000 g | Freq: Three times a day (TID) | INTRAVENOUS | Status: DC
  Administered 2016-07-20 – 2016-07-23 (×8): 1 g via INTRAVENOUS
  Filled 2016-07-20 (×11): qty 1

## 2016-07-20 MED ORDER — AMLODIPINE BESYLATE 10 MG PO TABS
10.0000 mg | ORAL_TABLET | Freq: Every day | ORAL | Status: DC
  Administered 2016-07-21 – 2016-07-29 (×7): 10 mg via ORAL
  Filled 2016-07-20 (×7): qty 1

## 2016-07-20 MED ORDER — OXYCODONE-ACETAMINOPHEN 5-325 MG PO TABS
1.0000 | ORAL_TABLET | Freq: Four times a day (QID) | ORAL | Status: DC
  Administered 2016-07-20 – 2016-07-22 (×11): 1 via ORAL
  Filled 2016-07-20 (×11): qty 1

## 2016-07-20 MED ORDER — SODIUM CHLORIDE 0.9% FLUSH: 3.0000 mL | INTRAVENOUS | Status: DC | PRN

## 2016-07-20 MED ORDER — ACETAMINOPHEN 650 MG RE SUPP
650.0000 mg | Freq: Four times a day (QID) | RECTAL | Status: DC | PRN
Start: 2016-07-20 — End: 2016-07-29

## 2016-07-20 MED ORDER — ONDANSETRON HCL 4 MG/2ML IJ SOLN: 4.0000 mg | Freq: Four times a day (QID) | INTRAMUSCULAR | Status: DC | PRN

## 2016-07-20 MED ORDER — SODIUM CHLORIDE 0.9 % IV SOLN: 250.0000 mL | INTRAVENOUS | Status: DC | PRN

## 2016-07-20 MED ORDER — ONDANSETRON HCL 4 MG PO TABS: 4.0000 mg | ORAL_TABLET | Freq: Four times a day (QID) | ORAL | Status: DC | PRN

## 2016-07-20 MED ORDER — SENNOSIDES-DOCUSATE SODIUM 8.6-50 MG PO TABS: 1.0000 | ORAL_TABLET | Freq: Every day | ORAL | Status: DC

## 2016-07-20 MED ORDER — CLONAZEPAM 0.5 MG PO TABS
0.2500 mg | ORAL_TABLET | Freq: Three times a day (TID) | ORAL | Status: DC | PRN
  Administered 2016-07-20 – 2016-07-27 (×8): 0.25 mg via ORAL
  Filled 2016-07-20 (×11): qty 1

## 2016-07-20 MED ORDER — CEFEPIME HCL 2 G IJ SOLR
2.0000 g | Freq: Once | INTRAMUSCULAR | Status: AC
  Administered 2016-07-20: 2 g via INTRAVENOUS
  Filled 2016-07-20: qty 2

## 2016-07-20 MED ORDER — VANCOMYCIN HCL 10 G IV SOLR
1500.0000 mg | Freq: Two times a day (BID) | INTRAVENOUS | Status: DC
  Administered 2016-07-20 – 2016-07-22 (×5): 1500 mg via INTRAVENOUS
  Filled 2016-07-20 (×8): qty 1500

## 2016-07-20 MED ORDER — SODIUM CHLORIDE 1 G PO TABS
1.0000 g | ORAL_TABLET | Freq: Two times a day (BID) | ORAL | Status: DC
  Administered 2016-07-21 – 2016-07-29 (×15): 1 g via ORAL
  Filled 2016-07-20 (×19): qty 1

## 2016-07-20 MED ORDER — MORPHINE SULFATE (PF) 2 MG/ML IV SOLN
2.0000 mg | INTRAVENOUS | Status: DC | PRN
Start: 2016-07-20 — End: 2016-07-20
  Administered 2016-07-20: 2 mg via INTRAVENOUS
  Filled 2016-07-20: qty 1

## 2016-07-20 MED ORDER — ACETAMINOPHEN 325 MG PO TABS
650.0000 mg | ORAL_TABLET | Freq: Four times a day (QID) | ORAL | Status: DC | PRN
  Administered 2016-07-20 – 2016-07-29 (×10): 650 mg via ORAL
  Filled 2016-07-20 (×10): qty 2

## 2016-07-20 NOTE — ED Triage Notes (Signed)
Pt states he had his right 2nd toe amputated on 2/28. Per EMS he was seen by his doctor and told his foot was infected. Pt received 1 percocet PTA by EMS. Upon EMS arrival pt not c/o of any pain, however once he was put in the ambulance he began to request Dilaudid, Morphine, and Percocet. Pt also taking cipro and doxy PO at assisted living facility. +Alert +oriented x4

## 2016-07-20 NOTE — ED Notes (Signed)
Pt resting quietly. Sleeping. Respirations even and unlabored. Lying on right side. Side rails up x2. Stretcher locked and in lowest position.

## 2016-07-20 NOTE — ED Provider Notes (Signed)
[facility] DEPT Provider Note   CSN: [id number] Arrival date & time: 07/20/16  9509     History   Chief Complaint Chief Complaint  Patient presents with  . Foot Pain    HPI Calvin Mckee is a 66 y.o. male.  Patient is here today for evaluation of right foot problem, "looking worse today".  He states that his doctor at the nursing care facility looked at the wound today and decided to transfer him here for evaluation because it was concerning for worsening infection.  Patient reports ongoing pain in his right foot, since amputation of the right second toe, about 6 weeks ago.  He was hospitalized recently discharged 2 days ago, on oral antibiotics for osteomyelitis, of the right foot.  Patient denies fever, chills, nausea, vomiting, weakness or dizziness.  He is apparently compliant with his prescribed medications.  There are no other known modifying factors.  HPI  Past Medical History:  Diagnosis Date  . Arthritis   . Hypertension     Patient Active Problem List   Diagnosis Date Noted  . Osteomyelitis ([place]) 07/10/2016  . Hypokalemia 07/10/2016  . Pain and swelling of right lower leg 07/10/2016  . Alcohol use 07/10/2016  . Polysubstance abuse 07/10/2016  . Tobacco use disorder 07/10/2016  . Cellulitis and abscess of toe of right foot 07/10/2016  . Toe amputation status, right ([place]) 07/06/2016    Past Surgical History:  Procedure Laterality Date  . CERVICAL FUSION         Home Medications    Prior to Admission medications   Medication Sig Start Date End Date Taking? Authorizing Provider  amLODipine (NORVASC) 10 MG tablet Take 1 tablet (10 mg total) by mouth daily. 07/17/16  Yes Florencia Reasons, MD  cholecalciferol (VITAMIN D) 1000 units tablet Take 1,000 Units by mouth 2 (two) times daily.   Yes Historical Provider, MD  ciprofloxacin (CIPRO) 500 MG tablet Take 1 tablet (500 mg total) by mouth 2 (two) times daily. 07/17/16 08/06/16 Yes Florencia Reasons, MD  clonazePAM (KLONOPIN)  0.5 MG tablet Take 0.5 tablets (0.25 mg total) by mouth 3 (three) times daily as needed (anxiety). Patient taking differently: Take 0.25 mg by mouth every 8 (eight) hours as needed for anxiety.  07/17/16  Yes Florencia Reasons, MD  doxycycline (VIBRAMYCIN) 100 MG capsule Take 1 capsule (100 mg total) by mouth 2 (two) times daily. Patient taking differently: Take 100 mg by mouth 2 (two) times daily. Started 04/07 for 20 days 07/17/16  Yes Florencia Reasons, MD  ibuprofen (ADVIL,MOTRIN) 800 MG tablet Take 1 tablet (800 mg total) by mouth every 8 (eight) hours as needed. Patient taking differently: Take 800 mg by mouth every 8 (eight) hours as needed (for pain).  07/06/16  Yes Leandrew Koyanagi, MD  magnesium oxide (MAG-OX) 400 MG tablet Take 1 tablet (400 mg total) by mouth daily. 07/17/16  Yes Florencia Reasons, MD  Multiple Vitamin (MULTIVITAMIN WITH MINERALS) TABS tablet Take 1 tablet by mouth daily.   Yes Historical Provider, MD  nicotine (NICODERM CQ - DOSED IN MG/24 HOURS) 21 mg/24hr patch Place 21 mg onto the skin daily.   Yes Historical Provider, MD  oxyCODONE-acetaminophen (PERCOCET/ROXICET) 5-325 MG tablet Take 1 tablet by mouth every 8 (eight) hours as needed for moderate pain. 07/17/16  Yes Florencia Reasons, MD  polyethylene glycol (MIRALAX / GLYCOLAX) packet Take 17 g by mouth 2 (two) times daily. 07/17/16  Yes Florencia Reasons, MD  saccharomyces boulardii (FLORASTOR) 250 MG capsule  Take 1 capsule (250 mg total) by mouth 2 (two) times daily. 07/17/16 08/16/16 Yes Florencia Reasons, MD  senna-docusate (SENOKOT-S) 8.6-50 MG tablet Take 1 tablet by mouth at bedtime. 07/17/16  Yes Florencia Reasons, MD  sodium chloride 1 g tablet Take 1 tablet (1 g total) by mouth 2 (two) times daily with a meal. 07/17/16 07/22/16 Yes Florencia Reasons, MD  thiamine 100 MG tablet Take 1 tablet (100 mg total) by mouth daily. 07/17/16  Yes Florencia Reasons, MD    Family History No family history on file.  Social History Social History  Substance Use Topics  . Smoking status: Current Every Day Smoker    Packs/day:  0.50    Types: Cigarettes  . Smokeless tobacco: Never Used  . Alcohol use Yes     Allergies   Patient has no known allergies.   Review of Systems Review of Systems  All other systems reviewed and are negative.    Physical Exam Updated Vital Signs BP (!) 145/82 (BP Location: Left Arm)   Pulse 92   Temp 98.3 F (36.8 C) (Oral)   Resp 12   Ht 5\' 9"  (1.753 m)   Wt 165 lb (74.8 kg)   SpO2 95%   BMI 24.37 kg/m   Physical Exam  Constitutional: He is oriented to person, place, and time. He appears well-developed and well-nourished.  HENT:  Head: Normocephalic and atraumatic.  Right Ear: External ear normal.  Left Ear: External ear normal.  Eyes: Conjunctivae and EOM are normal. Pupils are equal, round, and reactive to light.  Neck: Normal range of motion and phonation normal. Neck supple.  Cardiovascular: Normal rate and normal heart sounds.   Pulmonary/Chest: Effort normal. He exhibits no bony tenderness.  Musculoskeletal:  Right foot tender and swollen and he moves it gingerly because of pain.  The right third toe appears ischemic, and has a purplish discoloration.  An image is taken and entered into the chart, below.  Neurological: He is alert and oriented to person, place, and time. No cranial nerve deficit or sensory deficit. He exhibits normal muscle tone. Coordination normal.  Skin: Skin is warm, dry and intact.  Psychiatric: He has a normal mood and affect. His behavior is normal. Judgment and thought content normal.  Nursing note and vitals reviewed.         ED Treatments / Results  Labs (all labs ordered are listed, but only abnormal results are displayed) Labs Reviewed  BASIC METABOLIC PANEL - Abnormal; Notable for the following:       Result Value   Sodium 133 (*)    Chloride 98 (*)    All other components within normal limits  CBC WITH DIFFERENTIAL/PLATELET - Abnormal; Notable for the following:    RBC 3.71 (*)    Hemoglobin 11.2 (*)    HCT 31.5  (*)    Platelets 643 (*)    All other components within normal limits    EKG  EKG Interpretation None       Radiology No results found.  Procedures Procedures (including critical care time)  Medications Ordered in ED Medications  morphine 2 MG/ML injection 2 mg (not administered)     Initial Impression / Assessment and Plan / ED Course  I have reviewed the triage vital signs and the nursing notes.  Pertinent labs & imaging results that were available during my care of the patient were reviewed by me and considered in my medical decision making (see chart for details).  Clinical  Course as of Jul 21 1314  Tue Jul 20, 2016  1250 Case discussed with Dr. Alphonzo Severance, orthopedics, who reviewed the image of his foot and states that the foot looks worse than when he saw it several days ago.  He recommends admission with IV antibiotics, and orthopedic consultation.  [EW]    Clinical Course User Index [EW] Daleen Bo, MD    Medications  morphine 2 MG/ML injection 2 mg (not administered)    Patient Vitals for the past 24 hrs:  BP Temp Temp src Pulse Resp SpO2 Height Weight  07/20/16 1149 (!) 145/82 - - 92 12 95 % - -  07/20/16 0945 - - - - - - 5\' 9"  (1.753 m) 165 lb (74.8 kg)  07/20/16 0944 (!) 153/81 98.3 F (36.8 C) Oral (!) 104 20 98 % - -    12:50 PM-Consult complete with hospitalist. Patient case explained and discussed.  She agrees to admit patient for further evaluation and treatment. Call ended at 17: 55  1:16 PM Reevaluation with update and discussion. After initial assessment and treatment, an updated evaluation reveals is sitting in a chair at bedside is calm and comfortable. Mertie Haslem L    Final Clinical Impressions(s) / ED Diagnoses   Final diagnoses:  Osteomyelitis of right foot, unspecified type ([place])  Right foot infection   Patient with soft tissue and bony infection right foot, failing outpatient treatment, worsening on oral antibiotics.   Patient will likely need surgical intervention, after medical stabilization. after medical stabilization.   Nursing Notes Reviewed/ Care Coordinated Applicable Imaging Reviewed Interpretation of Laboratory Data incorporated into ED treatment   Plan: Admit   New Prescriptions New Prescriptions   No medications on file     Daleen Bo, MD 07/20/16 1318

## 2016-07-20 NOTE — ED Notes (Signed)
Pt found at door with all clothes on. I asked pt to get back in hospital gown but he refused,

## 2016-07-20 NOTE — ED Notes (Signed)
Bed: QD64 Expected date:  Expected time:  Means of arrival:  Comments: EMS-toe infection

## 2016-07-20 NOTE — ED Notes (Signed)
Delay in blood draw due to [place] at bedside

## 2016-07-20 NOTE — Progress Notes (Signed)
Pharmacy Antibiotic Follow-up Note  Calvin Mckee is a 66 y.o. year-old male admitted on 07/20/2016.  The patient is currently on day 1 of Vancoycin & Cefepime for osteomyelitis. Returned to [facility] 4/10 for worsening RLE wound. Prev admit 3/31 > 4/7 > SNF Rehab on Cipro/Doxycycline. R second toe amputation in [place] 3/6 - MRI 4/5 possible osteo  Assessment/Plan: Vancomycin 1gm x1, then 1500 IV every 12 hours.  Goal trough 15-20 mcg/mL.  Cefepime 2gm x1, then 1gm q8  Temp (24hrs), Avg:98.3 F (36.8 C), Min:98.3 F (36.8 C), Max:98.3 F (36.8 C)   Recent Labs Lab 07/14/16 0548 07/15/16 0541 07/16/16 0611 07/20/16 1201  WBC 12.2* 11.4* 11.7* 8.1    Recent Labs Lab 07/14/16 0548 07/15/16 0541 07/16/16 0611 07/17/16 0552 07/20/16 1201  CREATININE 0.64 0.61 0.67 0.61 0.72   Estimated Creatinine Clearance: 94.5 mL/min (by C-G formula based on SCr of 0.72 mg/dL).    No Known Allergies  Antimicrobials this admission: 4/10 Cefepime >>  4/10 Vancomycin >>   Levels/dose changes this admission:  Microbiology results: No cultures ordered at this time  Thank you for allowing pharmacy to be a part of this patient's care.  Minda Ditto PharmD Pager [phone] 07/20/2016, 2:56 PM

## 2016-07-20 NOTE — Progress Notes (Signed)
Patient trasfered from [facility] to 5W12 via [place]; alert and oriented x3; complaints of pain in right foot; IV saline locked in [place]. Patient receive pain medicine. Orient patient to room and unit;  gave patient care guide; instructed how to use the call bell and  fall risk precautions. Will continue to monitor the patient.

## 2016-07-20 NOTE — H&P (Addendum)
History and Physical    Calvin Mckee WUJ:[id number] DOB: 1950/10/27 DOA: 07/20/2016    PCP: Pcp Not In System  Patient coming from: SNF  Chief Complaint: sent for right foot "infection"  HPI: Calvin Mckee is a 66 y.o. male with medical history of HTN, ETOH use, Cocaine and tobacco abuse, right second toe amputation 3/6 in [place] who developed dehiscence of the wound and was started on antibiotics, presented with R foot pain on 3/31 and infection, started on Vanc and Cefepime. MRI on 4/5 showed possible osteomyelitis at the stump and [facility] orthopedics were consulted and recommend to continue antibiotics. He was discharged on Cipro and Doxy to SNF on 4/7.  -He states the physician at the SNF saw his foot today and told him he had to return to the hospital.   ED Course:   Sodium 133, Cl 98 Normal WBC count.   Review of Systems:  All other systems reviewed and apart from HPI, are negative.  Past Medical History:  Diagnosis Date  . Arthritis   . Hypertension     Past Surgical History:  Procedure Laterality Date  . CERVICAL FUSION      Social History:   reports that he has been smoking Cigarettes.  He has been smoking about 0.50 packs per day. He has never used smokeless tobacco. He reports that he drinks alcohol. He reports that he uses drugs, including "Crack" cocaine and Other-see comments.   Has been at [facility].  No Known Allergies  History reviewed. No pertinent family history.   Prior to Admission medications   Medication Sig Start Date End Date Taking? Authorizing Provider  amLODipine (NORVASC) 10 MG tablet Take 1 tablet (10 mg total) by mouth daily. 07/17/16  Yes Florencia Reasons, MD  cholecalciferol (VITAMIN D) 1000 units tablet Take 1,000 Units by mouth 2 (two) times daily.   Yes Historical Provider, MD  ciprofloxacin (CIPRO) 500 MG tablet Take 1 tablet (500 mg total) by mouth 2 (two) times daily. 07/17/16 08/06/16 Yes Florencia Reasons, MD  clonazePAM (KLONOPIN) 0.5 MG tablet  Take 0.5 tablets (0.25 mg total) by mouth 3 (three) times daily as needed (anxiety). Patient taking differently: Take 0.25 mg by mouth every 8 (eight) hours as needed for anxiety.  07/17/16  Yes Florencia Reasons, MD  doxycycline (VIBRAMYCIN) 100 MG capsule Take 1 capsule (100 mg total) by mouth 2 (two) times daily. Patient taking differently: Take 100 mg by mouth 2 (two) times daily. Started 04/07 for 20 days 07/17/16  Yes Florencia Reasons, MD  ibuprofen (ADVIL,MOTRIN) 800 MG tablet Take 1 tablet (800 mg total) by mouth every 8 (eight) hours as needed. Patient taking differently: Take 800 mg by mouth every 8 (eight) hours as needed (for pain).  07/06/16  Yes Leandrew Koyanagi, MD  magnesium oxide (MAG-OX) 400 MG tablet Take 1 tablet (400 mg total) by mouth daily. 07/17/16  Yes Florencia Reasons, MD  Multiple Vitamin (MULTIVITAMIN WITH MINERALS) TABS tablet Take 1 tablet by mouth daily.   Yes Historical Provider, MD  nicotine (NICODERM CQ - DOSED IN MG/24 HOURS) 21 mg/24hr patch Place 21 mg onto the skin daily.   Yes Historical Provider, MD  oxyCODONE-acetaminophen (PERCOCET/ROXICET) 5-325 MG tablet Take 1 tablet by mouth every 8 (eight) hours as needed for moderate pain. 07/17/16  Yes Florencia Reasons, MD  polyethylene glycol (MIRALAX / GLYCOLAX) packet Take 17 g by mouth 2 (two) times daily. 07/17/16  Yes Florencia Reasons, MD  saccharomyces boulardii (FLORASTOR) 250 MG  capsule Take 1 capsule (250 mg total) by mouth 2 (two) times daily. 07/17/16 08/16/16 Yes Florencia Reasons, MD  senna-docusate (SENOKOT-S) 8.6-50 MG tablet Take 1 tablet by mouth at bedtime. 07/17/16  Yes Florencia Reasons, MD  sodium chloride 1 g tablet Take 1 tablet (1 g total) by mouth 2 (two) times daily with a meal. 07/17/16 07/22/16 Yes Florencia Reasons, MD  thiamine 100 MG tablet Take 1 tablet (100 mg total) by mouth daily. 07/17/16  Yes Florencia Reasons, MD    Physical Exam: Vitals:   07/20/16 0944 07/20/16 0945 07/20/16 1149  BP: (!) 153/81  (!) 145/82  Pulse: (!) 104  92  Resp: 20  12  Temp: 98.3 F (36.8 C)    TempSrc:  Oral    SpO2: 98%  95%  Weight:  74.8 kg (165 lb)   Height:  5\' 9"  (1.753 m)       Constitutional: NAD, calm, comfortable Eyes: PERTLA, lids and conjunctivae normal ENMT: Mucous membranes are moist. Posterior pharynx clear of any exudate or lesions. Normal dentition.  Neck: normal, supple, no masses, no thyromegaly Respiratory: clear to auscultation bilaterally, no wheezing, no crackles. Normal respiratory effort. No accessory muscle use.  Cardiovascular: S1 & S2 heard, regular rate and rhythm, no murmurs / rubs / gallops. No extremity edema. 2+ pedal pulses. No carotid bruits.  Abdomen: No distension, no tenderness, no masses palpated. No hepatosplenomegaly. Bowel sounds normal.  Musculoskeletal: no clubbing / cyanosis. No joint deformity upper and lower extremities. Good ROM, no contractures. Normal muscle tone.  Skin: right foot is swollen, discolored blue/ black with denuded skin on dorsum of foot.  Neurologic: CN 2-12 grossly intact. Sensation intact, DTR normal. Strength 5/5 in all 4 limbs.  Psychiatric: Normal judgment and insight. Alert and oriented x 3. Normal mood.     Labs on Admission: I have personally reviewed following labs and imaging studies  CBC:  Recent Labs Lab 07/14/16 0548 07/15/16 0541 07/16/16 0611 07/20/16 1201  WBC 12.2* 11.4* 11.7* 8.1  NEUTROABS 8.0*  --  8.2* 5.2  HGB 9.8* 10.5* 11.4* 11.2*  HCT 28.2* 29.5* 32.0* 31.5*  MCV 84.7 83.8 84.4 84.9  PLT 352 408* 471* 778*   Basic Metabolic Panel:  Recent Labs Lab 07/14/16 0548 07/15/16 0541 07/16/16 0611 07/17/16 0552 07/20/16 1201  NA 129* 124* 133* 128* 133*  K 3.6 3.9 4.1 3.7 4.0  CL 93* 91* 96* 95* 98*  CO2 28 24 26 24 25   GLUCOSE 106* 108* 103* 118* 89  BUN 7 6 7 9 8   CREATININE 0.64 0.61 0.67 0.61 0.72  CALCIUM 8.6* 8.9 9.4 9.0 9.2  MG 1.9 2.0  --   --   --   PHOS 4.0  --   --   --   --    GFR: Estimated Creatinine Clearance: 94.5 mL/min (by C-G formula based on SCr of 0.72  mg/dL). Liver Function Tests:  Recent Labs Lab 07/14/16 0548  AST 19  ALT 21  ALKPHOS 53  BILITOT 0.7  PROT 7.5  ALBUMIN 2.8*   No results for input(s): LIPASE, AMYLASE in the last 168 hours. No results for input(s): AMMONIA in the last 168 hours. Coagulation Profile: No results for input(s): INR, PROTIME in the last 168 hours. Cardiac Enzymes: No results for input(s): CKTOTAL, CKMB, CKMBINDEX, TROPONINI in the last 168 hours. BNP (last 3 results) No results for input(s): PROBNP in the last 8760 hours. HbA1C: No results for input(s): HGBA1C in the last  72 hours. CBG:  Recent Labs Lab 07/14/16 0722 07/15/16 0820 07/16/16 0730 07/17/16 0742  GLUCAP 183* 108* 124* 115*   Lipid Profile: No results for input(s): CHOL, HDL, LDLCALC, TRIG, CHOLHDL, LDLDIRECT in the last 72 hours. Thyroid Function Tests: No results for input(s): TSH, T4TOTAL, FREET4, T3FREE, THYROIDAB in the last 72 hours. Anemia Panel: No results for input(s): VITAMINB12, FOLATE, FERRITIN, TIBC, IRON, RETICCTPCT in the last 72 hours. Urine analysis:    Component Value Date/Time   COLORURINE STRAW (A) 07/10/2016 1803   APPEARANCEUR CLEAR 07/10/2016 1803   LABSPEC 1.008 07/10/2016 1803   PHURINE 5.0 07/10/2016 1803   GLUCOSEU NEGATIVE 07/10/2016 1803   HGBUR SMALL (A) 07/10/2016 1803   BILIRUBINUR NEGATIVE 07/10/2016 1803   KETONESUR NEGATIVE 07/10/2016 1803   PROTEINUR NEGATIVE 07/10/2016 1803   UROBILINOGEN 1.0 10/09/2010 1111   NITRITE NEGATIVE 07/10/2016 1803   LEUKOCYTESUR NEGATIVE 07/10/2016 1803   Sepsis Labs: @LABRCNTIP (procalcitonin:4,lacticidven:4) ) Recent Results (from the past 240 hour(s))  Blood Cultures x 2 sites     Status: None   Collection Time: 07/10/16  3:50 PM  Result Value Ref Range Status   Specimen Description BLOOD RIGHT ANTECUBITAL  Final   Special Requests BOTTLES DRAWN AEROBIC AND ANAEROBIC 10CC  Final   Culture   Final    NO GROWTH 5 DAYS Performed at [facility], [place] [address], [place], [place] [address]    Report Status 07/15/2016 FINAL  Final  Blood Cultures x 2 sites     Status: None   Collection Time: 07/10/16  3:51 PM  Result Value Ref Range Status   Specimen Description BLOOD LEFT FOREARM  Final   Special Requests BOTTLES DRAWN AEROBIC AND ANAEROBIC 5ML  Final   Culture   Final    NO GROWTH 5 DAYS Performed at [facility], [place] [address] [place], [place] [address]    Report Status 07/15/2016 FINAL  Final  Urine culture     Status: None   Collection Time: 07/10/16  7:15 PM  Result Value Ref Range Status   Specimen Description URINE, CLEAN CATCH  Final   Special Requests NONE  Final   Culture   Final    NO GROWTH Performed at [facility], [place] [address], [place], [place] [address]    Report Status 07/12/2016 FINAL  Final     Radiological Exams on Admission: No results found.     Assessment/Plan Active Problems:   Gangrene and osteomyelitis of right foot - check ABIs as poor blood flow may be the reason for poor wound healing - Vanc and Cefepime - management per ortho  HTN - norvasc   Chronic hyponatremia- SIADH - cont sodium tab and add fluid restriction  Thrombocytosis - likely acute phase reactant type of response  DVT prophylaxis: Lovenox  Code Status: full code  Family Communication:   Disposition Plan: admit to med/surg at [facility] called: Dr Marlou Sa called by ER  Admission status: inpatient    Debbe Odea MD [facility] Pager: [email or web address] Password [email or web address] 7PM-7AM, please contact night-coverage   07/20/2016, 1:38 PM

## 2016-07-20 NOTE — ED Notes (Signed)
Pt awaiting doctor's evaluation.

## 2016-07-20 NOTE — ED Notes (Signed)
[facility] called for transport. 

## 2016-07-20 NOTE — Progress Notes (Signed)
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 Left ABI of 1.06 is suggestive of arterial flow within normal limits at rest.   RIGHT    LEFT    PRESSURE WAVEFORM  PRESSURE WAVEFORM  BRACHIAL 168 Triphasic BRACHIAL 172 Triphasic  DP 103 Monophasic DP 183 Biphasic  PT 67 Monophasic PT 179 Biphasic    RIGHT LEFT  ABI 0.6 1.06     Legrand Como, RVT 07/20/2016, 3:19 PM

## 2016-07-21 DIAGNOSIS — I96 Gangrene, not elsewhere classified: Secondary | ICD-10-CM

## 2016-07-21 DIAGNOSIS — M869 Osteomyelitis, unspecified: Secondary | ICD-10-CM

## 2016-07-21 DIAGNOSIS — M8600 Acute hematogenous osteomyelitis, unspecified site: Secondary | ICD-10-CM

## 2016-07-21 MED ORDER — MORPHINE SULFATE (PF) 4 MG/ML IV SOLN
4.0000 mg | Freq: Once | INTRAVENOUS | Status: AC
  Administered 2016-07-21: 4 mg via INTRAVENOUS
  Filled 2016-07-21: qty 1

## 2016-07-21 MED ORDER — HYDRALAZINE HCL 20 MG/ML IJ SOLN: 5.0000 mg | Freq: Four times a day (QID) | INTRAMUSCULAR | Status: DC | PRN

## 2016-07-21 MED ORDER — CLONAZEPAM 0.5 MG PO TABS
0.2500 mg | ORAL_TABLET | Freq: Once | ORAL | Status: AC
  Administered 2016-07-21: 0.25 mg via ORAL
  Filled 2016-07-21: qty 1

## 2016-07-21 NOTE — NC FL2 (Signed)
[place] LEVEL OF CARE SCREENING TOOL     IDENTIFICATION  Patient Name: Calvin Mckee Birthdate: 05-27-1950 Sex: male Admission Date (Current Location): 07/20/2016  [facility] and [place] Number:  Herbalist and Address:  [place], [place] [address] [place], [place] [address]      Provider Number: [id number]  Attending Physician Name and Address:  Reyne Dumas, MD  Relative Name and Phone Number:       Current Level of Care: Hospital Recommended Level of Care: [place] Prior Approval Number:    Date Approved/Denied:   PASRR Number:    Discharge Plan: SNF    Current Diagnoses: Patient Active Problem List   Diagnosis Date Noted  . Gangrene of right foot ([place]) 07/20/2016  . SIAD (syndrome of inappropriate antidiuresis) ([place]) 07/20/2016  . Benign essential HTN 07/20/2016  . Osteomyelitis ([place]) 07/10/2016  . Hypokalemia 07/10/2016  . Pain and swelling of right lower leg 07/10/2016  . Alcohol use 07/10/2016  . Polysubstance abuse 07/10/2016  . Tobacco use disorder 07/10/2016  . Cellulitis and abscess of toe of right foot 07/10/2016  . Toe amputation status, right ([place]) 07/06/2016    Orientation RESPIRATION BLADDER Height & Weight     Self, Time, Situation, Place  Normal Continent Weight: 74.8 kg (165 lb) Height:  5\' 9"  (175.3 cm)  BEHAVIORAL SYMPTOMS/MOOD NEUROLOGICAL BOWEL NUTRITION STATUS      Continent Diet (Please see DC Summary)  AMBULATORY STATUS COMMUNICATION OF NEEDS Skin   Extensive Assist Verbally Other (Comment) (Wound on toe)                       Personal Care Assistance Level of Assistance  Bathing, Feeding, Dressing Bathing Assistance: Limited assistance Feeding assistance: Independent Dressing Assistance: Limited assistance     Functional Limitations Info  Sight, Hearing, Speech Sight Info: Adequate Hearing Info: Adequate Speech Info: Adequate    SPECIAL CARE FACTORS  FREQUENCY  PT (By licensed PT), OT (By licensed OT)     PT Frequency: 5 OT Frequency: 5            Contractures Contractures Info: Not present    Additional Factors Info  Code Status, Allergies Code Status Info: Full code Allergies Info: NKA           Current Medications (07/21/2016):  This is the current hospital active medication list Current Facility-Administered Medications  Medication Dose Route Frequency Provider Last Rate Last Dose  . 0.9 %  sodium chloride infusion  250 mL Intravenous PRN Debbe Odea, MD      . acetaminophen (TYLENOL) tablet 650 mg  650 mg Oral Q6H PRN Debbe Odea, MD   650 mg at 07/21/16 0543   Or  . acetaminophen (TYLENOL) suppository 650 mg  650 mg Rectal Q6H PRN Debbe Odea, MD      . amLODipine (NORVASC) tablet 10 mg  10 mg Oral Daily Debbe Odea, MD   10 mg at 07/21/16 0829  . ceFEPIme (MAXIPIME) 1 g in dextrose 5 % 50 mL IVPB  1 g Intravenous Q8H Terri L Green, RPH   1 g at 07/21/16 1346  . clonazePAM (KLONOPIN) tablet 0.25 mg  0.25 mg Oral TID PRN Debbe Odea, MD   0.25 mg at 07/21/16 0656  . enoxaparin (LOVENOX) injection 40 mg  40 mg Subcutaneous Q24H Debbe Odea, MD   40 mg at 07/20/16 2128  . morphine 2 MG/ML injection 2  mg  2 mg Intravenous Q4H PRN Debbe Odea, MD   2 mg at 07/21/16 1044  . ondansetron (ZOFRAN) tablet 4 mg  4 mg Oral Q6H PRN Debbe Odea, MD       Or  . ondansetron (ZOFRAN) injection 4 mg  4 mg Intravenous Q6H PRN Debbe Odea, MD      . oxyCODONE-acetaminophen (PERCOCET/ROXICET) 5-325 MG per tablet 1 tablet  1 tablet Oral QID Debbe Odea, MD   1 tablet at 07/21/16 1346  . senna-docusate (Senokot-S) tablet 2 tablet  2 tablet Oral QHS Debbe Odea, MD   2 tablet at 07/20/16 2129  . sodium chloride flush (NS) 0.9 % injection 3 mL  3 mL Intravenous Q12H Debbe Odea, MD   3 mL at 07/20/16 2135  . sodium chloride flush (NS) 0.9 % injection 3 mL  3 mL Intravenous PRN Debbe Odea, MD      . sodium chloride tablet 1 g  1  g Oral BID WC Debbe Odea, MD   1 g at 07/21/16 0829  . vancomycin (VANCOCIN) 1,500 mg in sodium chloride 0.9 % 500 mL IVPB  1,500 mg Intravenous Q12H Minda Ditto, RPH   1,500 mg at 07/21/16 3295     Discharge Medications: Please see discharge summary for a list of discharge medications.  Relevant Imaging Results:  Relevant Lab Results:   Additional Information [id number]  Benard Halsted, LCSWA

## 2016-07-21 NOTE — Consult Note (Signed)
ORTHOPAEDIC CONSULTATION  REQUESTING PHYSICIAN: Reyne Dumas, MD  Chief Complaint: Painful dry gangrene right foot  HPI: Calvin Mckee is a 66 y.o. male who presents with painful dry gangrene right foot. Patient is a smoker with peripheral vascular disease. Patient states he's had progressive painful gangrenous changes to his right foot.  Past Medical History:  Diagnosis Date  . Arthritis   . Hypertension    Past Surgical History:  Procedure Laterality Date  . CERVICAL FUSION     Social History   Social History  . Marital status: Single    Spouse name: N/A  . Number of children: N/A  . Years of education: N/A   Social History Main Topics  . Smoking status: Current Every Day Smoker    Packs/day: 0.50    Types: Cigarettes  . Smokeless tobacco: Never Used  . Alcohol use Yes  . Drug use: Yes    Types: "Crack" cocaine, Other-see comments     Comment: percocet   . Sexual activity: No   Other Topics Concern  . None   Social History Narrative  . None   History reviewed. No pertinent family history. - negative except otherwise stated in the family history section No Known Allergies Prior to Admission medications   Medication Sig Start Date End Date Taking? Authorizing Provider  amLODipine (NORVASC) 10 MG tablet Take 1 tablet (10 mg total) by mouth daily. 07/17/16  Yes Florencia Reasons, MD  cholecalciferol (VITAMIN D) 1000 units tablet Take 1,000 Units by mouth 2 (two) times daily.   Yes Historical Provider, MD  ciprofloxacin (CIPRO) 500 MG tablet Take 1 tablet (500 mg total) by mouth 2 (two) times daily. 07/17/16 08/06/16 Yes Florencia Reasons, MD  clonazePAM (KLONOPIN) 0.5 MG tablet Take 0.5 tablets (0.25 mg total) by mouth 3 (three) times daily as needed (anxiety). Patient taking differently: Take 0.25 mg by mouth every 8 (eight) hours as needed for anxiety.  07/17/16  Yes Florencia Reasons, MD  doxycycline (VIBRAMYCIN) 100 MG capsule Take 1 capsule (100 mg total) by mouth 2 (two) times  daily. Patient taking differently: Take 100 mg by mouth 2 (two) times daily. Started 04/07 for 20 days 07/17/16  Yes Florencia Reasons, MD  ibuprofen (ADVIL,MOTRIN) 800 MG tablet Take 1 tablet (800 mg total) by mouth every 8 (eight) hours as needed. Patient taking differently: Take 800 mg by mouth every 8 (eight) hours as needed (for pain).  07/06/16  Yes Leandrew Koyanagi, MD  magnesium oxide (MAG-OX) 400 MG tablet Take 1 tablet (400 mg total) by mouth daily. 07/17/16  Yes Florencia Reasons, MD  Multiple Vitamin (MULTIVITAMIN WITH MINERALS) TABS tablet Take 1 tablet by mouth daily.   Yes Historical Provider, MD  nicotine (NICODERM CQ - DOSED IN MG/24 HOURS) 21 mg/24hr patch Place 21 mg onto the skin daily.   Yes Historical Provider, MD  oxyCODONE-acetaminophen (PERCOCET/ROXICET) 5-325 MG tablet Take 1 tablet by mouth every 8 (eight) hours as needed for moderate pain. 07/17/16  Yes Florencia Reasons, MD  polyethylene glycol (MIRALAX / GLYCOLAX) packet Take 17 g by mouth 2 (two) times daily. 07/17/16  Yes Florencia Reasons, MD  saccharomyces boulardii (FLORASTOR) 250 MG capsule Take 1 capsule (250 mg total) by mouth 2 (two) times daily. 07/17/16 08/16/16 Yes Florencia Reasons, MD  senna-docusate (SENOKOT-S) 8.6-50 MG tablet Take 1 tablet by mouth at bedtime. 07/17/16  Yes Florencia Reasons, MD  sodium chloride 1 g tablet Take 1 tablet (1 g total) by mouth 2 (two)  times daily with a meal. 07/17/16 07/22/16 Yes Florencia Reasons, MD  thiamine 100 MG tablet Take 1 tablet (100 mg total) by mouth daily. 07/17/16  Yes Florencia Reasons, MD   No results found. - pertinent xrays, CT, MRI studies were reviewed and independently interpreted  Positive ROS: All other systems have been reviewed and were otherwise negative with the exception of those mentioned in the HPI and as above.  Physical Exam: General: Alert, Patient is an mild distress complaining of pain in his right foot. Psychiatric: Patient is competent for consent with normal mood and affect Lymphatic: No axillary or cervical  lymphadenopathy Cardiovascular: No pedal edema Respiratory: No cyanosis, no use of accessory musculature GI: No organomegaly, abdomen is soft and non-tender  Skin: Examination patient has black gangrenous changes to the midfoot. There is no ascending cellulitis. Patient does not have palpable pulses.   Neurologic: Patient does not have protective sensation bilateral lower extremities.   MUSCULOSKELETAL:  Examination patient has dry gangrenous changes to the midfoot. There is no cellulitis no odor no drainage.  Patient's ankle-brachial indices on the right are 0.6.  Assessment: Assessment: Peripheral vascular disease with ankle-brachial indices of 0.6 on the right with dry gangrenous changes to the midfoot.  Plan: Plan: Patient will need a vascular workup to see if he is a revascularization candidate. At this point his only option would be a transtibial amputation and I'm uncertain if that would heal. I will follow-up once his vascular status is established.  Thank you for the consult and the opportunity to see Mr. Leana Roe, MD [facility] [phone] 3:40 PM

## 2016-07-21 NOTE — Progress Notes (Signed)
[facility] PROGRESS NOTE  Calvin Mckee WIO:[id number] DOB: 06-Jan-1951 DOA: 07/20/2016   PCP: Pcp Not In System     Assessment/Plan: Active Problems:   Osteomyelitis ([place])   Gangrene of right foot ([place])   SIAD (syndrome of inappropriate antidiuresis) ([facility])   Benign essential HTN   65 y.o.malewith medical history significant of Recent Right 2nd toe Amputation (been on Abx with Keflex, Clindamycin, and Doxycycline), Tobacco Abuse, EtOH use, Polysubstance Abuse including recent Tobacco Abuse ,Patient recently had a 2nd Right Toe Amputation on March 6 at [facility] in [place] and recently saw Dr. Erlinda Hong at [facility] 07/06/16. After that he was admitted between 3/31>4/7 for right foot second toe ulceration and cellulitis. Treated with IV antibiotics including IV vancomycin and cefepime for 8 days. [facility] was consulted and recommended to continue antibiotic and outpatient follow-up. Patient admitted back on 4/10, for worsening appearance of the right foot. He states that his physician at SNF sent him back to the hospital for further evaluation  Assessment and plan Recurrent cellulitis of the right foot Restarted on IV vancomycin and IV cefepime Will request orthopedics to evaluate, dr duda notified  CRP 19.5, obtain culture  Thrombocytosis - likely acute phase reactant type of response  Chronic hyponatremia- SIADH - cont sodium tab and add fluid restriction, follow sodium   Hypertension continue Norvasc, hydralazine prn   EtOH Use Concern for Withdrawal of Alcohol and Opiates -Patient states he drinks at leas 2 beers Daily -CIWA Protocol with Lorazepam  Tobacco Abuse -Nicotine Patch increase to 21mg  ,  -Smoking Cessation Counseling Given  Hx of Polysubstance Abuse --Has Hx of Cocaine Abuse . Use percocet from the street,   DVT prophylaxsis Lovenox  Code Status:  Full code    Family Communication: Discussed in detail with the  patient, all imaging results, lab results explained to the patient   Disposition Plan:  Orthopedic consultation      Consultants:  Orthopedics  Procedures:  None  Antibiotics: Anti-infectives    Start     Dose/Rate Route Frequency Ordered Stop   07/20/16 2200  ceFEPIme (MAXIPIME) 1 g in dextrose 5 % 50 mL IVPB     1 g 100 mL/hr over 30 Minutes Intravenous Every 8 hours 07/20/16 1444     07/20/16 2000  vancomycin (VANCOCIN) 1,500 mg in sodium chloride 0.9 % 500 mL IVPB     1,500 mg 250 mL/hr over 120 Minutes Intravenous Every 12 hours 07/20/16 1451     07/20/16 1345  ceFEPIme (MAXIPIME) 2 g in dextrose 5 % 50 mL IVPB     2 g 100 mL/hr over 30 Minutes Intravenous  Once 07/20/16 1331 07/20/16 1418   07/20/16 1345  vancomycin (VANCOCIN) IVPB 1000 mg/200 mL premix     1,000 mg 200 mL/hr over 60 Minutes Intravenous  Once 07/20/16 1331 07/20/16 1559         HPI/Subjective: Comfortable,no pain in right foot   Objective: Vitals:   07/20/16 1810 07/21/16 0046 07/21/16 0631 07/21/16 0829  BP: (!) 149/72 (!) 153/78 (!) 166/81 (!) 157/81  Pulse: 99 89 98   Resp: 20 18 18    Temp: 98.9 F (37.2 C)  97.7 F (36.5 C)   TempSrc: Oral  Oral   SpO2: 93% 100% 100%   Weight:      Height:        Intake/Output Summary (Last 24 hours) at 07/21/16 0917 Last data filed at 07/21/16 0900  Gross per 24 hour  Intake             3805 ml  Output             2650 ml  Net             1155 ml    Exam:  Examination:  General exam: Appears calm and comfortable  Respiratory system: Clear to auscultation. Respiratory effort normal. Cardiovascular system: S1 & S2 heard, RRR. No JVD, murmurs, rubs, gallops or clicks. No pedal edema. Gastrointestinal system: Abdomen is nondistended, soft and nontender. No organomegaly or masses felt. Normal bowel sounds heard. Central nervous system: Alert and oriented. No focal neurological deficits. Extremities: right foot covered in dressing  Skin: No  rashes, lesions or ulcers Psychiatry: Judgement and insight appear normal. Mood & affect appropriate.     Data Reviewed: I have personally reviewed following labs and imaging studies  Micro Results Recent Results (from the past 240 hour(s))  MRSA PCR Screening     Status: None   Collection Time: 07/20/16  6:01 PM  Result Value Ref Range Status   MRSA by PCR NEGATIVE NEGATIVE Final    Comment:        The GeneXpert MRSA Assay (FDA approved for NASAL specimens only), is one component of a comprehensive MRSA colonization surveillance program. It is not intended to diagnose MRSA infection nor to guide or monitor treatment for MRSA infections.     Radiology Reports Mr Foot Right Wo Contrast  Result Date: 07/15/2016 CLINICAL DATA:  Right foot pain worsening since Saturday. Leg started swelling and patient has been on unable to walk nor put any pressure on it. EXAM: MRI OF THE RIGHT FOREFOOT WITHOUT CONTRAST TECHNIQUE: Multiplanar, multisequence MR imaging of the right forefoot was performed. No intravenous contrast was administered. COMPARISON:  Radiographs of the right foot from 07/15/2016 FINDINGS: Despite patient having received the maximum amount of medication, the study is markedly limited due to motion artifacts. Bones/Joint/Cartilage The patient is status post amputation at the base of the right proximal phalanx with suggestion of T1 hypointensity in T2 fat sat hyperintensity at site of amputation. Findings are suspicious for osteomyelitis. Ligaments Suboptimally assessed Muscles and Tendons Suboptimally assessed Soft tissues Soft tissue edema about the forefoot special along the plantar aspect of the first and second digits. IMPRESSION: Markedly compromised study due to patient motion artifacts. Marrow signal abnormalities at the amputation site of the base of the second proximal phalanx with surrounding soft tissue inflammation/edema raise concern for the presence of osteomyelitis at  the stump. Electronically Signed   By: Ashley Royalty M.D.   On: 07/15/2016 20:08   Dg Chest Port 1 View  Result Date: 07/14/2016 CLINICAL DATA:  Fever with right foot infection EXAM: PORTABLE CHEST 1 VIEW COMPARISON:  02/11/2010 FINDINGS: Surgical wire at the lower cervical spine. Minimal atelectasis right base. No consolidation or effusion. Heart size upper normal. Minimal atherosclerosis. No pneumothorax. IMPRESSION: Minimal atelectasis at the right base. No acute infiltrate or edema. Electronically Signed   By: Donavan Foil M.D.   On: 07/14/2016 18:26   Ap / Lateral X-ray Right Foot  Result Date: 07/10/2016 CLINICAL DATA:  RIGHT foot pain, recent amputation of second toe in February, pain and that this toe EXAM: RIGHT FOOT - 2 VIEW COMPARISON:  05/12/2016 FINDINGS: Interval amputation of second toe through the proximal phalanx. Poorly defined osseous stump at the proximal phalanx, may related to resorption secondary to amputation though infection is not  excluded with this appearance. Mild soft tissue swelling at the stump in more diffusely at the dorsum of the entire RIGHT foot. Joint space narrowing at first MTP joint. Remaining joint spaces preserved. No acute fracture, dislocation or additional bone destruction. IMPRESSION: Prior amputation of RIGHT second toe through the proximal phalanx with poor definition of the osseous stump, could represent resorption secondary to resection but osteomyelitis is not excluded. If there is persistent clinical concern for osteomyelitis or soft tissue abscess in the RIGHT foot, consider MR. Electronically Signed   By: Lavonia Dana M.D.   On: 07/10/2016 16:19     CBC  Recent Labs Lab 07/15/16 0541 07/16/16 0611 07/20/16 1201 07/20/16 1335  WBC 11.4* 11.7* 8.1 8.0  HGB 10.5* 11.4* 11.2* 10.4*  HCT 29.5* 32.0* 31.5* 30.3*  PLT 408* 471* 643* 669*  MCV 83.8 84.4 84.9 84.6  MCH 29.8 30.1 30.2 29.1  MCHC 35.6 35.6 35.6 34.3  RDW 13.1 13.1 12.9 13.0   LYMPHSABS  --  1.9 2.0  --   MONOABS  --  1.4* 0.8  --   EOSABS  --  0.1 0.2  --   BASOSABS  --  0.1 0.1  --     Chemistries   Recent Labs Lab 07/15/16 0541 07/16/16 0611 07/17/16 0552 07/20/16 1201 07/20/16 1335  NA 124* 133* 128* 133*  --   K 3.9 4.1 3.7 4.0  --   CL 91* 96* 95* 98*  --   CO2 24 26 24 25   --   GLUCOSE 108* 103* 118* 89  --   BUN 6 7 9 8   --   CREATININE 0.61 0.67 0.61 0.72 0.64  CALCIUM 8.9 9.4 9.0 9.2  --   MG 2.0  --   --   --   --    ------------------------------------------------------------------------------------------------------------------ estimated creatinine clearance is 94.5 mL/min (by C-G formula based on SCr of 0.64 mg/dL). ------------------------------------------------------------------------------------------------------------------ No results for input(s): HGBA1C in the last 72 hours. ------------------------------------------------------------------------------------------------------------------ No results for input(s): CHOL, HDL, LDLCALC, TRIG, CHOLHDL, LDLDIRECT in the last 72 hours. ------------------------------------------------------------------------------------------------------------------ No results for input(s): TSH, T4TOTAL, T3FREE, THYROIDAB in the last 72 hours.  Invalid input(s): FREET3 ------------------------------------------------------------------------------------------------------------------ No results for input(s): VITAMINB12, FOLATE, FERRITIN, TIBC, IRON, RETICCTPCT in the last 72 hours.  Coagulation profile No results for input(s): INR, PROTIME in the last 168 hours.  No results for input(s): DDIMER in the last 72 hours.  Cardiac Enzymes No results for input(s): CKMB, TROPONINI, MYOGLOBIN in the last 168 hours.  Invalid input(s): CK ------------------------------------------------------------------------------------------------------------------ Invalid input(s): POCBNP   CBG:  Recent Labs Lab  07/15/16 0820 07/16/16 0730 07/17/16 0742  GLUCAP 108* 124* 115*       Studies: No results found.    Lab Results  Component Value Date   HGBA1C 6.2 (H) 07/11/2016   Lab Results  Component Value Date   CREATININE 0.64 07/20/2016       Scheduled Meds: . amLODipine  10 mg Oral Daily  . ceFEPime (MAXIPIME) IV  1 g Intravenous Q8H  . enoxaparin (LOVENOX) injection  40 mg Subcutaneous Q24H  . oxyCODONE-acetaminophen  1 tablet Oral QID  . senna-docusate  2 tablet Oral QHS  . sodium chloride flush  3 mL Intravenous Q12H  . sodium chloride  1 g Oral BID WC  . vancomycin  1,500 mg Intravenous Q12H   Continuous Infusions:   LOS: 1 day    Time spent: >30 MINS    [place] [employer]  [facility] Pager  [phone]. If 7PM-7AM, please contact night-coverage at [email or web address], password [facility] 07/21/2016, 9:17 AM  LOS: 1 day

## 2016-07-22 ENCOUNTER — Inpatient Hospital Stay (HOSPITAL_COMMUNITY): Payer: Medicare Other

## 2016-07-22 DIAGNOSIS — L089 Local infection of the skin and subcutaneous tissue, unspecified: Secondary | ICD-10-CM

## 2016-07-22 DIAGNOSIS — I70261 Atherosclerosis of native arteries of extremities with gangrene, right leg: Secondary | ICD-10-CM

## 2016-07-22 MED ORDER — OXYCODONE HCL 5 MG PO TABS
10.0000 mg | ORAL_TABLET | Freq: Once | ORAL | Status: AC
  Administered 2016-07-22: 10 mg via ORAL
  Filled 2016-07-22: qty 2

## 2016-07-22 MED ORDER — NICOTINE 21 MG/24HR TD PT24
21.0000 mg | MEDICATED_PATCH | Freq: Every day | TRANSDERMAL | Status: DC
  Administered 2016-07-22 – 2016-07-29 (×6): 21 mg via TRANSDERMAL
  Filled 2016-07-22 (×6): qty 1

## 2016-07-22 NOTE — Consult Note (Signed)
[facility] NOTE   MRN : [id number]  Reason for Consult: Right foot non healing ulcer Referring Physician: Dr. Sharol Given  History of Present Illness: 66 y/o male with chronic non healing right foot wound.  He was seen by Dr. Sharol Given "At this point his only option would be a transtibial amputation and I'm uncertain if that would heal."  We have been asked to evaluate his arterial flow to determine if he can heal a a transtibial amputation or if he will require an AKA. Has significant pain in right foot for last 1 week. Alleviated with pain medication, exacerbated with any manipulation. Previously walked without limitation. Has 2nd toe amputation from February that has failed to heal. Continues to smoke daily.   Past med Hx:  HTN, ETOH use, Cocaine and tobacco abuse, right second toe amputation 3/6 in [place] who developed dehiscence of the wound and was started on antibiotics, presented with R foot pain on 3/31 and infection, started on Vanc and Cefepime. MRI on 4/5 showed possible osteomyelitis at the stump.       Current Facility-Administered Medications  Medication Dose Route Frequency Provider Last Rate Last Dose  . 0.9 %  sodium chloride infusion  250 mL Intravenous PRN Debbe Odea, MD      . acetaminophen (TYLENOL) tablet 650 mg  650 mg Oral Q6H PRN Debbe Odea, MD   650 mg at 07/22/16 0201   Or  . acetaminophen (TYLENOL) suppository 650 mg  650 mg Rectal Q6H PRN Debbe Odea, MD      . amLODipine (NORVASC) tablet 10 mg  10 mg Oral Daily Debbe Odea, MD   10 mg at 07/22/16 0844  . ceFEPIme (MAXIPIME) 1 g in dextrose 5 % 50 mL IVPB  1 g Intravenous Q8H Terri L Green, RPH   1 g at 07/22/16 0526  . clonazePAM (KLONOPIN) tablet 0.25 mg  0.25 mg Oral TID PRN Debbe Odea, MD   0.25 mg at 07/21/16 2144  . enoxaparin (LOVENOX) injection 40 mg  40 mg Subcutaneous Q24H Debbe Odea, MD   40 mg at 07/21/16 2144  . hydrALAZINE (APRESOLINE) injection 5 mg  5 mg  Intravenous Q6H PRN Reyne Dumas, MD      . morphine 2 MG/ML injection 2 mg  2 mg Intravenous Q4H PRN Debbe Odea, MD   2 mg at 07/22/16 1011  . nicotine (NICODERM CQ - dosed in mg/24 hours) patch 21 mg  21 mg Transdermal Daily Reyne Dumas, MD      . ondansetron (ZOFRAN) tablet 4 mg  4 mg Oral Q6H PRN Debbe Odea, MD       Or  . ondansetron (ZOFRAN) injection 4 mg  4 mg Intravenous Q6H PRN Debbe Odea, MD      . oxyCODONE-acetaminophen (PERCOCET/ROXICET) 5-325 MG per tablet 1 tablet  1 tablet Oral QID Debbe Odea, MD   1 tablet at 07/22/16 0844  . senna-docusate (Senokot-S) tablet 2 tablet  2 tablet Oral QHS Debbe Odea, MD   2 tablet at 07/21/16 2144  . sodium chloride flush (NS) 0.9 % injection 3 mL  3 mL Intravenous Q12H Debbe Odea, MD   3 mL at 07/22/16 0852  . sodium chloride flush (NS) 0.9 % injection 3 mL  3 mL Intravenous PRN Debbe Odea, MD      . sodium chloride tablet 1 g  1 g Oral BID WC Debbe Odea, MD   1 g at 07/22/16 0844  . vancomycin (  VANCOCIN) 1,500 mg in sodium chloride 0.9 % 500 mL IVPB  1,500 mg Intravenous Q12H Minda Ditto, RPH   1,500 mg at 07/22/16 0843    Pt meds include: Statin :No Betablocker: No ASA: No Other anticoagulants/antiplatelets: none  Past Medical History:  Diagnosis Date  . Arthritis   . Hypertension     Past Surgical History:  Procedure Laterality Date  . CERVICAL FUSION      Social History Social History  Substance Use Topics  . Smoking status: Current Every Day Smoker    Packs/day: 0.50    Types: Cigarettes  . Smokeless tobacco: Never Used  . Alcohol use Yes    Family History History reviewed. No pertinent family history.  Unknown to patient  No Known Allergies   REVIEW OF SYSTEMS  General: [ ]  Weight loss, [ ]  Fever, [ ]  chills Neurologic: [ ]  Dizziness, [ ]  Blackouts, [ ]  Seizure [ ]  Stroke, [ ]  "Mini stroke", [ ]  Slurred speech, [ ]  Temporary blindness; [ ]  weakness in arms or legs, [ ]  Hoarseness [ ]   Dysphagia Cardiac: [ ]  Chest pain/pressure, [ ]  Shortness of breath at rest [ ]  Shortness of breath with exertion, [ ]  Atrial fibrillation or irregular heartbeat  Vascular: [ ]  Pain in legs with walking, [ x] Pain in legs at rest, [ ]  Pain in legs at night,  [x ] Non-healing ulcer, [ ]  Blood clot in vein/DVT,   Pulmonary: [ ]  Home oxygen, [ ]  Productive cough, [ ]  Coughing up blood, [ ]  Asthma,  [ ]  Wheezing [ ]  COPD Musculoskeletal:  [ ]  Arthritis, [ ]  Low back pain, [ ]  Joint pain Hematologic: [ ]  Easy Bruising, [ ]  Anemia; [ ]  Hepatitis Gastrointestinal: [ ]  Blood in stool, [ ]  Gastroesophageal Reflux/heartburn, Urinary: [ ]  chronic Kidney disease, [ ]  on HD - [ ]  MWF or [ ]  TTHS, [ ]  Burning with urination, [ ]  Difficulty urinating Skin: [ ]  Rashes, [ ]  Wounds Psychological: [x ] Anxiety, [ ]  Depression  Physical Examination Vitals:   07/21/16 1620 07/21/16 2130 07/22/16 0638 07/22/16 0843  BP: (!) 156/76 (!) 157/78 (!) 147/93 121/84  Pulse: 97 93 95   Resp: 20 18 18    Temp: 99 F (37.2 C) 98.7 F (37.1 C) 98.2 F (36.8 C)   TempSrc: Oral Oral Oral   SpO2: 97% 100% 96%   Weight:      Height:       Body mass index is 24.37 kg/m.  General:  WDWN in NAD Gait: Normal HENT: WNL Eyes: Pupils equal Pulmonary: normal non-labored breathing , without Rales, rhonchi,  wheezing Cardiac: RRR, without  Murmurs, rubs or gallops; No carotid bruits Abdomen: soft, NT, no masses Skin: no rashes,  Positive right foot ulcer noted;  Positive right foot Gangrene , no cellulitis;  Vascular Exam/Pulses:Radial, femoral, and left popliteal palpable pulses.  Doppler weak signal right LE PT/DP/peroneal, biphasic left PT/DP/Peroneal   Musculoskeletal: no muscle wasting or atrophy; no edema  Neurologic: A&O X 3; Appropriate Affect ;  SENSATION: normal; MOTOR FUNCTION: 5/5 Symmetric Speech is fluent/normal   Significant Diagnostic Studies: CBC Lab Results  Component Value Date   WBC 8.0  07/20/2016   HGB 10.4 (L) 07/20/2016   HCT 30.3 (L) 07/20/2016   MCV 84.6 07/20/2016   PLT 669 (H) 07/20/2016    BMET    Component Value Date/Time   NA 133 (L) 07/20/2016 1201   K 4.0 07/20/2016 1201  CL 98 (L) 07/20/2016 1201   CO2 25 07/20/2016 1201   GLUCOSE 89 07/20/2016 1201   BUN 8 07/20/2016 1201   CREATININE 0.64 07/20/2016 1335   CALCIUM 9.2 07/20/2016 1201   GFRNONAA >60 07/20/2016 1335   GFRAA >60 07/20/2016 1335   Estimated Creatinine Clearance: 94.5 mL/min (by C-G formula based on SCr of 0.64 mg/dL).  COAG Lab Results  Component Value Date   INR 1.0 02/06/2007     Non-Invasive Vascular Imaging:  ABI completed: Right ABI of 0.6 is suggestive of moderate arterial occlusive disease at rest. Left ABI of 1.06 is suggestive of arterial flow within normal limits at rest.   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  The wound is very advanced and even with improved blood flow he will likely need at least the transtibial amputation recommended by Dr. Sharol Given. We will plan on aortogram with Right LE runoff and possible intervention tomorrow by Dr. Oneida Alar.  NPO past MN.  Continue current antibiotics.   Laurence Slate [facility] 07/22/2016 11:06 AM   I have independently interviewed patient and agree with PA assessment and plan above. Has wound on right foot with poor prognosis. Needs aspirin and statin therapy. Plan for aortogram with bilateral runoff and possible intervention of right with Dr. Oneida Alar tomorrow. Discussed risks and benefits of angiogram and guarded prognosis of right lower extremity including that revascularization might be only to  preserve a below knee amputation.  Tamme Mozingo C. Donzetta Matters, MD [facility] [place] Office: [phone] Pager: [phone]

## 2016-07-22 NOTE — Progress Notes (Signed)
Smoke could be smelled on the unit. Pt had lit a cigarette, but had thrown it and the whole pack away by the time staff arrived at his room.

## 2016-07-22 NOTE — Progress Notes (Signed)
[facility] PROGRESS NOTE  Calvin Mckee SPQ:[id number] DOB: 12-Apr-1951 DOA: 07/20/2016   PCP: Pcp Not In System     Assessment/Plan: Active Problems:   Osteomyelitis ([place])   Gangrene of right foot ([place])   SIAD (syndrome of inappropriate antidiuresis) ([facility])   Benign essential HTN   Right foot infection   65 y.o.malewith medical history significant of Recent Right 2nd toe Amputation (been on Abx with Keflex, Clindamycin, and Doxycycline), Tobacco Abuse, EtOH use, Polysubstance Abuse including recent Tobacco Abuse ,Patient recently had a 2nd Right Toe Amputation on March 6 at [facility] in [place] and recently saw Dr. Erlinda Hong at [facility] 07/06/16. After that he was admitted between 3/31>4/7 for right foot second toe ulceration and cellulitis. Treated with IV antibiotics including IV vancomycin and cefepime for 8 days. [facility] was consulted and recommended to continue antibiotic and outpatient follow-up. Patient admitted back on 4/10, for worsening appearance of the right foot. He states that [facility] to the hospital for further evaluation  Assessment and plan Recurrent cellulitis of the right foot Continue IV vancomycin and IV cefepime, day #3 Appreciate dr Sharol Given recommendations-vascular surgery consulted for ankle-brachial indices of 0.6 on the right CRP 19.5,    Thrombocytosis - likely acute phase reactant type of response  Chronic hyponatremia- SIADH - cont sodium tab and add fluid restriction, follow sodium, improved   Hypertension continue Norvasc, hydralazine prn   EtOH Use Concern for Withdrawal of Alcohol and Opiates -Patient states he drinks at leas 2 beers Daily -CIWA Protocol with Lorazepam  Tobacco Abuse -Nicotine Patch increase to 21mg  ,  -Smoking Cessation Counseling Given  Hx of Polysubstance Abuse --Has Hx of Cocaine Abuse . Use percocet from the street,   DVT prophylaxsis Lovenox  Code  Status:  Full code    Family Communication: Discussed in detail with the patient, all imaging results, lab results explained to the patient   Disposition Plan:  Vascular surgery evaluation      Consultants:  Orthopedics  Vascular surgery  Procedures:  None  Antibiotics: Anti-infectives    Start     Dose/Rate Route Frequency Ordered Stop   07/20/16 2200  ceFEPIme (MAXIPIME) 1 g in dextrose 5 % 50 mL IVPB     1 g 100 mL/hr over 30 Minutes Intravenous Every 8 hours 07/20/16 1444     07/20/16 2000  vancomycin (VANCOCIN) 1,500 mg in sodium chloride 0.9 % 500 mL IVPB     1,500 mg 250 mL/hr over 120 Minutes Intravenous Every 12 hours 07/20/16 1451     07/20/16 1345  ceFEPIme (MAXIPIME) 2 g in dextrose 5 % 50 mL IVPB     2 g 100 mL/hr over 30 Minutes Intravenous  Once 07/20/16 1331 07/20/16 1418   07/20/16 1345  vancomycin (VANCOCIN) IVPB 1000 mg/200 mL premix     1,000 mg 200 mL/hr over 60 Minutes Intravenous  Once 07/20/16 1331 07/20/16 1559         HPI/Subjective: Very agitated ,dressed and wants to leave , hiding cigarettes in his chair  and seems like he is withdrawing , also heard him screaming prior to entering the room ,when he was asked about pain, he denied having any pain  Objective: Vitals:   07/21/16 1620 07/21/16 2130 07/22/16 0638 07/22/16 0843  BP: (!) 156/76 (!) 157/78 (!) 147/93 121/84  Pulse: 97 93 95   Resp: 20 18 18    Temp: 99 F (  37.2 C) 98.7 F (37.1 C) 98.2 F (36.8 C)   TempSrc: Oral Oral Oral   SpO2: 97% 100% 96%   Weight:      Height:        Intake/Output Summary (Last 24 hours) at 07/22/16 0952 Last data filed at 07/22/16 0741  Gross per 24 hour  Intake             1630 ml  Output             4025 ml  Net            -2395 ml    Exam:  Examination:  General exam: Appears calm and comfortable  Respiratory system: Clear to auscultation. Respiratory effort normal. Cardiovascular system: S1 & S2 heard, RRR. No JVD, murmurs,  rubs, gallops or clicks. No pedal edema. Gastrointestinal system: Abdomen is nondistended, soft and nontender. No organomegaly or masses felt. Normal bowel sounds heard. Central nervous system:   No focal neurological deficits. Extremities: right foot covered in dressing  Skin: No rashes, lesions or ulcers Psychiatry: agitated      Data Reviewed: I have personally reviewed following labs and imaging studies  Micro Results Recent Results (from the past 240 hour(s))  MRSA PCR Screening     Status: None   Collection Time: 07/20/16  6:01 PM  Result Value Ref Range Status   MRSA by PCR NEGATIVE NEGATIVE Final    Comment:        The GeneXpert MRSA Assay (FDA approved for NASAL specimens only), is one component of a comprehensive MRSA colonization surveillance program. It is not intended to diagnose MRSA infection nor to guide or monitor treatment for MRSA infections.     Radiology Reports Mr Foot Right Wo Contrast  Result Date: 07/15/2016 CLINICAL DATA:  Right foot pain worsening since Saturday. Leg started swelling and patient has been on unable to walk nor put any pressure on it. EXAM: MRI OF THE RIGHT FOREFOOT WITHOUT CONTRAST TECHNIQUE: Multiplanar, multisequence MR imaging of the right forefoot was performed. No intravenous contrast was administered. COMPARISON:  Radiographs of the right foot from 07/15/2016 FINDINGS: Despite patient having received the maximum amount of medication, the study is markedly limited due to motion artifacts. Bones/Joint/Cartilage The patient is status post amputation at the base of the right proximal phalanx with suggestion of T1 hypointensity in T2 fat sat hyperintensity at site of amputation. Findings are suspicious for osteomyelitis. Ligaments Suboptimally assessed Muscles and Tendons Suboptimally assessed Soft tissues Soft tissue edema about the forefoot special along the plantar aspect of the first and second digits. IMPRESSION: Markedly compromised  study due to patient motion artifacts. Marrow signal abnormalities at the amputation site of the base of the second proximal phalanx with surrounding soft tissue inflammation/edema raise concern for the presence of osteomyelitis at the stump. Electronically Signed   By: Ashley Royalty M.D.   On: 07/15/2016 20:08   Dg Chest Port 1 View  Result Date: 07/14/2016 CLINICAL DATA:  Fever with right foot infection EXAM: PORTABLE CHEST 1 VIEW COMPARISON:  02/11/2010 FINDINGS: Surgical wire at the lower cervical spine. Minimal atelectasis right base. No consolidation or effusion. Heart size upper normal. Minimal atherosclerosis. No pneumothorax. IMPRESSION: Minimal atelectasis at the right base. No acute infiltrate or edema. Electronically Signed   By: Donavan Foil M.D.   On: 07/14/2016 18:26   Ap / Lateral X-ray Right Foot  Result Date: 07/10/2016 CLINICAL DATA:  RIGHT foot pain, recent amputation of second toe in  February, pain and that this toe EXAM: RIGHT FOOT - 2 VIEW COMPARISON:  05/12/2016 FINDINGS: Interval amputation of second toe through the proximal phalanx. Poorly defined osseous stump at the proximal phalanx, may related to resorption secondary to amputation though infection is not excluded with this appearance. Mild soft tissue swelling at the stump in more diffusely at the dorsum of the entire RIGHT foot. Joint space narrowing at first MTP joint. Remaining joint spaces preserved. No acute fracture, dislocation or additional bone destruction. IMPRESSION: Prior amputation of RIGHT second toe through the proximal phalanx with poor definition of the osseous stump, could represent resorption secondary to resection but osteomyelitis is not excluded. If there is persistent clinical concern for osteomyelitis or soft tissue abscess in the RIGHT foot, consider MR. Electronically Signed   By: Lavonia Dana M.D.   On: 07/10/2016 16:19     CBC  Recent Labs Lab 07/16/16 0611 07/20/16 1201 07/20/16 1335  WBC  11.7* 8.1 8.0  HGB 11.4* 11.2* 10.4*  HCT 32.0* 31.5* 30.3*  PLT 471* 643* 669*  MCV 84.4 84.9 84.6  MCH 30.1 30.2 29.1  MCHC 35.6 35.6 34.3  RDW 13.1 12.9 13.0  LYMPHSABS 1.9 2.0  --   MONOABS 1.4* 0.8  --   EOSABS 0.1 0.2  --   BASOSABS 0.1 0.1  --     Chemistries   Recent Labs Lab 07/16/16 0611 07/17/16 0552 07/20/16 1201 07/20/16 1335  NA 133* 128* 133*  --   K 4.1 3.7 4.0  --   CL 96* 95* 98*  --   CO2 26 24 25   --   GLUCOSE 103* 118* 89  --   BUN 7 9 8   --   CREATININE 0.67 0.61 0.72 0.64  CALCIUM 9.4 9.0 9.2  --    ------------------------------------------------------------------------------------------------------------------ estimated creatinine clearance is 94.5 mL/min (by C-G formula based on SCr of 0.64 mg/dL). ------------------------------------------------------------------------------------------------------------------ No results for input(s): HGBA1C in the last 72 hours. ------------------------------------------------------------------------------------------------------------------ No results for input(s): CHOL, HDL, LDLCALC, TRIG, CHOLHDL, LDLDIRECT in the last 72 hours. ------------------------------------------------------------------------------------------------------------------ No results for input(s): TSH, T4TOTAL, T3FREE, THYROIDAB in the last 72 hours.  Invalid input(s): FREET3 ------------------------------------------------------------------------------------------------------------------ No results for input(s): VITAMINB12, FOLATE, FERRITIN, TIBC, IRON, RETICCTPCT in the last 72 hours.  Coagulation profile No results for input(s): INR, PROTIME in the last 168 hours.  No results for input(s): DDIMER in the last 72 hours.  Cardiac Enzymes No results for input(s): CKMB, TROPONINI, MYOGLOBIN in the last 168 hours.  Invalid input(s):  CK ------------------------------------------------------------------------------------------------------------------ Invalid input(s): POCBNP   CBG:  Recent Labs Lab 07/16/16 0730 07/17/16 0742  GLUCAP 124* 115*       Studies: No results found.    Lab Results  Component Value Date   HGBA1C 6.2 (H) 07/11/2016   Lab Results  Component Value Date   CREATININE 0.64 07/20/2016       Scheduled Meds: . amLODipine  10 mg Oral Daily  . ceFEPime (MAXIPIME) IV  1 g Intravenous Q8H  . enoxaparin (LOVENOX) injection  40 mg Subcutaneous Q24H  . oxyCODONE-acetaminophen  1 tablet Oral QID  . senna-docusate  2 tablet Oral QHS  . sodium chloride flush  3 mL Intravenous Q12H  . sodium chloride  1 g Oral BID WC  . vancomycin  1,500 mg Intravenous Q12H   Continuous Infusions:   LOS: 2 days    Time spent: >30 MINS    Reyne Dumas  [facility] Pager [phone]. If 7PM-7AM, please contact night-coverage  at [email or web address], password [facility] 07/22/2016, 9:52 AM  LOS: 2 days

## 2016-07-23 ENCOUNTER — Encounter (HOSPITAL_COMMUNITY): Payer: Self-pay | Admitting: Vascular Surgery

## 2016-07-23 ENCOUNTER — Encounter (HOSPITAL_COMMUNITY)
Admission: EM | Disposition: A | Discharge status: Skilled Nursing Facility | Payer: Self-pay | Source: Home / Self Care | Attending: Internal Medicine

## 2016-07-23 HISTORY — PX: LOWER EXTREMITY ANGIOGRAPHY: CATH118251

## 2016-07-23 HISTORY — PX: ABDOMINAL AORTOGRAM: CATH118222

## 2016-07-23 HISTORY — PX: PERIPHERAL VASCULAR INTERVENTION: CATH118257

## 2016-07-23 LAB — CBC
HCT: 30.5 % — ABNORMAL LOW (ref 39.0–52.0)
Hemoglobin: 10.4 g/dL — ABNORMAL LOW (ref 13.0–17.0)
MCH: 29.8 pg (ref 26.0–34.0)
MCHC: 34.1 g/dL (ref 30.0–36.0)
MCV: 87.4 fL (ref 78.0–100.0)
PLATELETS: 644 10*3/uL — AB (ref 150–400)
RBC: 3.49 MIL/uL — ABNORMAL LOW (ref 4.22–5.81)
RDW: 13.4 % (ref 11.5–15.5)
WBC: 7.1 10*3/uL (ref 4.0–10.5)

## 2016-07-23 LAB — SURGICAL PCR SCREEN
MRSA, PCR: NEGATIVE
Staphylococcus aureus: NEGATIVE

## 2016-07-23 LAB — BASIC METABOLIC PANEL
Anion gap: 9 (ref 5–15)
CALCIUM: 9.2 mg/dL (ref 8.9–10.3)
CO2: 26 mmol/L (ref 22–32)
CREATININE: 0.62 mg/dL (ref 0.61–1.24)
Chloride: 97 mmol/L — ABNORMAL LOW (ref 101–111)
GFR calc Af Amer: >60 mL/min (ref >60)
Glucose, Bld: 108 mg/dL — ABNORMAL HIGH (ref 65–99)
Potassium: 4 mmol/L (ref 3.5–5.1)
SODIUM: 132 mmol/L — AB (ref 135–145)

## 2016-07-23 LAB — PROTIME-INR
INR: 1.14
Prothrombin Time: 14.7 seconds (ref 11.4–15.2)

## 2016-07-23 LAB — POCT ACTIVATED CLOTTING TIME
ACTIVATED CLOTTING TIME: 197 s
ACTIVATED CLOTTING TIME: 219 s
ACTIVATED CLOTTING TIME: 169 s

## 2016-07-23 LAB — VANCOMYCIN, TROUGH: Vancomycin Tr: 18 ug/mL (ref 15–20)

## 2016-07-23 SURGERY — ABDOMINAL AORTOGRAM
Anesthesia: LOCAL | Laterality: Right

## 2016-07-23 MED ORDER — OXYCODONE HCL 5 MG PO TABS
10.0000 mg | ORAL_TABLET | ORAL | Status: DC | PRN
  Administered 2016-07-23 – 2016-07-29 (×28): 10 mg via ORAL
  Filled 2016-07-23 (×26): qty 2

## 2016-07-23 MED ORDER — HYDRALAZINE HCL 20 MG/ML IJ SOLN: 5.0000 mg | INTRAMUSCULAR | Status: DC | PRN

## 2016-07-23 MED ORDER — HYDRALAZINE HCL 20 MG/ML IJ SOLN
10.0000 mg | INTRAMUSCULAR | Status: DC | PRN
  Administered 2016-07-23 – 2016-07-24 (×2): 10 mg via INTRAVENOUS
  Filled 2016-07-23 (×2): qty 1

## 2016-07-23 MED ORDER — HEPARIN (PORCINE) IN NACL 2-0.9 UNIT/ML-% IJ SOLN
INTRAMUSCULAR | Status: DC | PRN
  Administered 2016-07-23: 1000 mL

## 2016-07-23 MED ORDER — ALPRAZOLAM 0.5 MG PO TABS
0.5000 mg | ORAL_TABLET | Freq: Three times a day (TID) | ORAL | Status: DC | PRN
  Administered 2016-07-23 – 2016-07-29 (×11): 0.5 mg via ORAL
  Filled 2016-07-23 (×12): qty 1

## 2016-07-23 MED ORDER — LABETALOL HCL 5 MG/ML IV SOLN: 10.0000 mg | INTRAVENOUS | Status: DC | PRN

## 2016-07-23 MED ORDER — SODIUM CHLORIDE 0.45 % IV SOLN
INTRAVENOUS | Status: DC
  Administered 2016-07-23: 500 mL via INTRAVENOUS

## 2016-07-23 MED ORDER — ONDANSETRON HCL 4 MG/2ML IJ SOLN: 4.0000 mg | Freq: Four times a day (QID) | INTRAMUSCULAR | Status: DC | PRN

## 2016-07-23 MED ORDER — LIDOCAINE HCL (PF) 1 % IJ SOLN
INTRAMUSCULAR | Status: DC | PRN
  Administered 2016-07-23: 15 mL

## 2016-07-23 MED ORDER — HEPARIN SODIUM (PORCINE) 1000 UNIT/ML IJ SOLN
INTRAMUSCULAR | Status: DC | PRN
  Administered 2016-07-23: 7000 [IU] via INTRAVENOUS
  Administered 2016-07-23: 2000 [IU] via INTRAVENOUS

## 2016-07-23 MED ORDER — DOCUSATE SODIUM 100 MG PO CAPS
100.0000 mg | ORAL_CAPSULE | Freq: Every day | ORAL | Status: DC
  Administered 2016-07-24 – 2016-07-29 (×5): 100 mg via ORAL
  Filled 2016-07-23 (×5): qty 1

## 2016-07-23 MED ORDER — CLOPIDOGREL BISULFATE 75 MG PO TABS
75.0000 mg | ORAL_TABLET | Freq: Every day | ORAL | Status: DC
  Administered 2016-07-24 – 2016-07-29 (×5): 75 mg via ORAL
  Filled 2016-07-23 (×5): qty 1

## 2016-07-23 MED ORDER — MIDAZOLAM HCL 2 MG/2ML IJ SOLN
INTRAMUSCULAR | Status: AC
  Filled 2016-07-23: qty 2

## 2016-07-23 MED ORDER — LIDOCAINE HCL (PF) 1 % IJ SOLN
INTRAMUSCULAR | Status: AC
  Filled 2016-07-23: qty 30

## 2016-07-23 MED ORDER — MIDAZOLAM HCL 2 MG/2ML IJ SOLN
INTRAMUSCULAR | Status: DC | PRN
  Administered 2016-07-23 (×2): 1 mg via INTRAVENOUS

## 2016-07-23 MED ORDER — FENTANYL CITRATE (PF) 100 MCG/2ML IJ SOLN
INTRAMUSCULAR | Status: AC
  Filled 2016-07-23: qty 2

## 2016-07-23 MED ORDER — CLOPIDOGREL BISULFATE 75 MG PO TABS
300.0000 mg | ORAL_TABLET | Freq: Once | ORAL | Status: DC
  Filled 2016-07-23: qty 4

## 2016-07-23 MED ORDER — ASPIRIN EC 81 MG PO TBEC
81.0000 mg | DELAYED_RELEASE_TABLET | Freq: Every day | ORAL | Status: DC
  Administered 2016-07-24 – 2016-07-29 (×5): 81 mg via ORAL
  Filled 2016-07-23 (×5): qty 1

## 2016-07-23 MED ORDER — FENTANYL CITRATE (PF) 100 MCG/2ML IJ SOLN
INTRAMUSCULAR | Status: DC | PRN
  Administered 2016-07-23 (×4): 25 ug via INTRAVENOUS

## 2016-07-23 MED ORDER — SODIUM CHLORIDE 0.9 % IV SOLN
INTRAVENOUS | Status: DC
  Administered 2016-07-23 – 2016-07-28 (×2): via INTRAVENOUS

## 2016-07-23 MED ORDER — MORPHINE SULFATE (PF) 2 MG/ML IV SOLN
2.0000 mg | Freq: Once | INTRAVENOUS | Status: AC
  Administered 2016-07-23: 2 mg via INTRAVENOUS
  Filled 2016-07-23: qty 1

## 2016-07-23 MED ORDER — OXYCODONE HCL 5 MG PO TABS
ORAL_TABLET | ORAL | Status: AC
  Filled 2016-07-23: qty 2

## 2016-07-23 MED ORDER — IODIXANOL 320 MG/ML IV SOLN
INTRAVENOUS | Status: DC | PRN
  Administered 2016-07-23: 165 mL via INTRAVENOUS

## 2016-07-23 MED ORDER — DEXTROSE 5 % IV SOLN
1.0000 g | Freq: Three times a day (TID) | INTRAVENOUS | Status: DC
  Administered 2016-07-24 – 2016-07-29 (×15): 1 g via INTRAVENOUS
  Filled 2016-07-23 (×20): qty 1

## 2016-07-23 MED ORDER — METOPROLOL TARTRATE 5 MG/5ML IV SOLN: 2.0000 mg | INTRAVENOUS | Status: DC | PRN

## 2016-07-23 MED ORDER — SODIUM CHLORIDE 0.9 % IV SOLN
INTRAVENOUS | Status: DC
  Administered 2016-07-24 – 2016-07-26 (×4): via INTRAVENOUS

## 2016-07-23 MED ORDER — MORPHINE SULFATE (PF) 4 MG/ML IV SOLN
INTRAVENOUS | Status: AC
  Filled 2016-07-23: qty 1

## 2016-07-23 MED ORDER — VANCOMYCIN HCL 10 G IV SOLR
1500.0000 mg | Freq: Two times a day (BID) | INTRAVENOUS | Status: DC
  Administered 2016-07-23 – 2016-07-29 (×12): 1500 mg via INTRAVENOUS
  Filled 2016-07-23 (×13): qty 1500

## 2016-07-23 SURGICAL SUPPLY — 22 items
BALLN ARMADA 6X100X135 (BALLOONS) ×3
BALLOON ARMADA 6X100X135 (BALLOONS) ×2 IMPLANT
CATH ANGIO 5F PIGTAIL 65CM (CATHETERS) ×3 IMPLANT
CATH CROSS OVER TEMPO 5F (CATHETERS) ×3 IMPLANT
CATH QUICKCROSS SUPP .035X90CM (MICROCATHETER) ×6 IMPLANT
CATH STRAIGHT 5FR 65CM (CATHETERS) ×3 IMPLANT
COVER PRB 48X5XTLSCP FOLD TPE (BAG) ×2 IMPLANT
COVER PROBE 5X48 (BAG) ×1
DEVICE CONTINUOUS FLUSH (MISCELLANEOUS) ×3 IMPLANT
GUIDEWIRE ANGLED .035X150CM (WIRE) ×3 IMPLANT
GUIDEWIRE ANGLED .035X260CM (WIRE) ×3 IMPLANT
KIT ENCORE 26 ADVANTAGE (KITS) ×3 IMPLANT
KIT PV (KITS) ×3 IMPLANT
SHEATH PINNACLE 5F 10CM (SHEATH) ×3 IMPLANT
SHEATH PINNACLE MP 7F 45CM (SHEATH) ×3 IMPLANT
STENT ABSOLUTE PRO 6X100X135 (Permanent Stent) ×6 IMPLANT
SYR MEDRAD MARK V 150ML (SYRINGE) ×3 IMPLANT
TRANSDUCER W/STOPCOCK (MISCELLANEOUS) ×3 IMPLANT
TRAY PV CATH (CUSTOM PROCEDURE TRAY) ×3 IMPLANT
WIRE HI TORQ VERSACORE J 260CM (WIRE) ×3 IMPLANT
WIRE HITORQ VERSACORE ST 145CM (WIRE) ×6 IMPLANT
WIRE VERSACORE LOC 115CM (WIRE) ×3 IMPLANT

## 2016-07-23 NOTE — Interval H&P Note (Signed)
History and Physical Interval Note:  07/23/2016 8:59 AM  Calvin Mckee  has presented today for surgery, with the diagnosis of pad, non-healing right foot surgical wound  The various methods of treatment have been discussed with the patient and family. After consideration of risks, benefits and other options for treatment, the patient has consented to  Procedure(s): Abdominal Aortogram w/Lower Extremity (N/A) as a surgical intervention .  The patient's history has been reviewed, patient examined, no change in status, stable for surgery.  I have reviewed the patient's chart and labs.  Questions were answered to the patient's satisfaction.     Ruta Hinds

## 2016-07-23 NOTE — H&P (View-Only) (Signed)
  Progress Note    07/23/2016 7:15 AM * No surgery date entered *  Subjective:  No new issues, right foot hurting  Vitals:   07/22/16 2107 07/23/16 0608  BP: (!) 146/63 (!) 146/94  Pulse: (!) 102 89  Resp: 18 18  Temp: 98.1 F (36.7 C) 98.3 F (36.8 C)    Physical Exam: [id number] Non labored respirations Palpable femoral pulses bilaterally, palpable left popliteal pulse Right foot wound cdi  CBC    Component Value Date/Time   WBC 7.1 07/23/2016 0540   RBC 3.49 (L) 07/23/2016 0540   HGB 10.4 (L) 07/23/2016 0540   HCT 30.5 (L) 07/23/2016 0540   PLT 644 (H) 07/23/2016 0540   MCV 87.4 07/23/2016 0540   MCH 29.8 07/23/2016 0540   MCHC 34.1 07/23/2016 0540   RDW 13.4 07/23/2016 0540   LYMPHSABS 2.0 07/20/2016 1201   MONOABS 0.8 07/20/2016 1201   EOSABS 0.2 07/20/2016 1201   BASOSABS 0.1 07/20/2016 1201    BMET    Component Value Date/Time   NA 132 (L) 07/23/2016 0540   K 4.0 07/23/2016 0540   CL 97 (L) 07/23/2016 0540   CO2 26 07/23/2016 0540   GLUCOSE 108 (H) 07/23/2016 0540   BUN <5 (L) 07/23/2016 0540   CREATININE 0.62 07/23/2016 0540   CALCIUM 9.2 07/23/2016 0540   GFRNONAA >60 07/23/2016 0540   GFRAA >60 07/23/2016 0540    INR    Component Value Date/Time   INR 1.14 07/23/2016 0540     Intake/Output Summary (Last 24 hours) at 07/23/16 0715 Last data filed at 07/23/16 0608  Gross per 24 hour  Intake             1520 ml  Output             2570 ml  Net            -1050 ml     Assessment:  66 y.o. male is here with non healing right 2nd toe amputation site, high risk for transtibial amputation  Plan: Aortogram with possible Right lower extremity intervention today. I again discussed his high risk of requiring more proximal amputation.    Chanice Brenton C. Donzetta Matters, MD [facility] [place] Office: [phone] Pager: [phone]  07/23/2016 7:15 AM

## 2016-07-23 NOTE — Progress Notes (Signed)
Pharmacy Antibiotic Note  Calvin Mckee is a 66 y.o. male with recurrent R foot cellulitis/gangrene on day4 cefepime and vancomycin. He is noted s/p angioplasty and stenting of right superficial femoral artery on 4/13. May need BKA.  -vancomycin level= 18 on 1500mg  IV q12h (collected about 1 hr late) -SCr= 0.62 on 4/13  Plan: -No vancomycin changes needed -Continue cefepime -Will follow plans for length of therapy  Height: 5\' 9"  (175.3 cm) Weight: 165 lb (74.8 kg) IBW/kg (Calculated) : 70.7  Temp (24hrs), Avg:98.3 F (36.8 C), Min:98.1 F (36.7 C), Max:98.7 F (37.1 C)   Recent Labs Lab 07/17/16 0552 07/20/16 1201 07/20/16 1335 07/23/16 0540 07/23/16 0843  WBC  --  8.1 8.0 7.1  --   CREATININE 0.61 0.72 0.64 0.62  --   VANCOTROUGH  --   --   --   --  18    Estimated Creatinine Clearance: 94.5 mL/min (by C-G formula based on SCr of 0.62 mg/dL).    No Known Allergies  Antimicrobials this admission: 4/10 Cefepime >>  4/10 Vancomycin >>                                 Microbiology results: 4/13 MRSA PCR- negative  Thank you for allowing pharmacy to be a part of this patient's care.  Hildred Laser, Pharm D 07/23/2016 3:06 PM

## 2016-07-23 NOTE — Progress Notes (Signed)
Pt received from cath lab. Pt oriented to room and equipment. Pt belongings at bedside. Telemetry applied. VSS. Pt meal ordered per request.   Fritz Pickerel, RN

## 2016-07-23 NOTE — Progress Notes (Signed)
Pt.still c/o in  A lot of pain on rt.foot; he had morphine 2mg  iv @0030  & percocet @2200  .& still crying & c/o pain .MD on call Baltazar Najjar was called & ordered morphine 2mg  in x 1 dose.Will continue to monitor pt.

## 2016-07-23 NOTE — Progress Notes (Addendum)
[facility] PROGRESS NOTE  Calvin Mckee QMV:[id number] DOB: 1950/08/27 DOA: 07/20/2016   PCP: Pcp Not In System     Assessment/Plan: Active Problems:   Osteomyelitis ([place])   Gangrene of right foot ([place])   SIAD (syndrome of inappropriate antidiuresis) (H[facility])   Benign essential HTN   Right foot infection   65 y.o.malewith medical history significant of Recent Right 2nd toe Amputation (been on Abx with Keflex, Clindamycin, and Doxycycline), Tobacco Abuse, EtOH use, Polysubstance Abuse including recent Tobacco Abuse ,Patient recently had a 2nd Right Toe Amputation on March 6 at [facility] in [place] and recently saw Dr. Erlinda Hong at [facility] 07/06/16. After that he was admitted between 3/31>4/7 for right foot second toe ulceration and cellulitis. Treated with IV antibiotics including IV vancomycin and cefepime for 8 days. [facility] was consulted and recommended to continue antibiotic and outpatient follow-up. Patient admitted back on 4/10, for worsening appearance of the right foot. 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  Discontinued Percocet, start oxycodone 10 mg every 4 as needed   DVT prophylaxsis Lovenox  Code Status:  Full code    Family Communication: Discussed in detail with the patient, all imaging results, lab results explained to the patient   Disposition Plan:  Vascular surgery evaluation      Consultants:  Orthopedics  Vascular surgery  Procedures:  None  Antibiotics: Anti-infectives    Start     Dose/Rate Route Frequency Ordered Stop   07/20/16 2200  ceFEPIme (MAXIPIME) 1 g in dextrose 5 % 50 mL IVPB     1 g 100 mL/hr over 30 Minutes Intravenous Every 8 hours 07/20/16 1444     07/20/16 2000  vancomycin (VANCOCIN) 1,500 mg in sodium chloride 0.9 % 500 mL IVPB     1,500 mg 250 mL/hr over 120 Minutes Intravenous Every 12 hours 07/20/16 1451     07/20/16 1345  ceFEPIme (MAXIPIME) 2 g in dextrose 5 % 50 mL IVPB     2 g 100 mL/hr over 30 Minutes Intravenous  Once 07/20/16 1331 07/20/16 1418   07/20/16 1345  vancomycin (VANCOCIN) IVPB 1000 mg/200 mL premix     1,000 mg 200 mL/hr over 60 Minutes Intravenous  Once 07/20/16 1331 07/20/16 1559         HPI/Subjective: Continues to be extremely anxious and agitated,tachycardiac and hypertensive   Objective: Vitals:   07/22/16 0843 07/22/16 1543 07/22/16 2107 07/23/16 0608  BP: 121/84 (!) 147/79 (!) 146/63 (!) 146/94  Pulse:  96 (!) 102 89  Resp:  18 18 18   Temp:  98.2 F (36.8 C) 98.1 F (36.7  C) 98.3 F (36.8 C)  TempSrc:  Oral Oral Oral  SpO2:  99% 99% 98%  Weight:      Height:        Intake/Output Summary (Last 24 hours) at 07/23/16 0855 Last data filed at 07/23/16 8588  Gross per 24 hour  Intake             1520 ml  Output             2170 ml  Net             -650 ml    Exam:  Examination:  General exam: Appears calm and comfortable  Respiratory system: Clear to auscultation. Respiratory effort  normal. Cardiovascular system: S1 & S2 heard, RRR. No JVD, murmurs, rubs, gallops or clicks. No pedal edema. Gastrointestinal system: Abdomen is nondistended, soft and nontender. No organomegaly or masses felt. Normal bowel sounds heard. Central nervous system:   No focal neurological deficits. Extremities: right foot covered in dressing  Skin: No rashes, lesions or ulcers Psychiatry: agitated      Data Reviewed: I have personally reviewed following labs and imaging studies  Micro Results Recent Results (from the past 240 hour(s))  MRSA PCR Screening     Status: None   Collection Time: 07/20/16  6:01 PM  Result Value Ref Range Status   MRSA by PCR NEGATIVE NEGATIVE Final    Comment:        The GeneXpert MRSA Assay (FDA approved for NASAL specimens only), is one component of a comprehensive MRSA colonization surveillance program. It is not intended to diagnose MRSA infection nor to guide or monitor treatment for MRSA infections.   Surgical pcr screen     Status: None   Collection Time: 07/23/16  3:58 AM  Result Value Ref Range Status   MRSA, PCR NEGATIVE NEGATIVE Final   Staphylococcus aureus NEGATIVE NEGATIVE Final    Comment:        The Xpert SA Assay (FDA approved for NASAL specimens in patients over 79 years of age), is one component of a comprehensive surveillance program.  Test performance has been validated by [facility] for patients greater than or equal to 49 year old. It is not intended to diagnose infection nor to guide or monitor treatment.     Radiology Reports Mr Foot Right Wo Contrast  Result Date: 07/15/2016 CLINICAL DATA:  Right foot pain worsening since Saturday. Leg started swelling and patient has been on unable to walk nor put any pressure on it. EXAM: MRI OF THE RIGHT FOREFOOT WITHOUT CONTRAST TECHNIQUE: Multiplanar, multisequence MR imaging of the right forefoot was performed. No intravenous contrast was administered. COMPARISON:  Radiographs  of the right foot from 07/15/2016 FINDINGS: Despite patient having received the maximum amount of medication, the study is markedly limited due to motion artifacts. Bones/Joint/Cartilage The patient is status post amputation at the base of the right proximal phalanx with suggestion of T1 hypointensity in T2 fat sat hyperintensity at site of amputation. Findings are suspicious for osteomyelitis. Ligaments Suboptimally assessed Muscles and Tendons Suboptimally assessed Soft tissues Soft tissue edema about the forefoot special along the plantar aspect of the first and second digits. IMPRESSION: Markedly compromised study due to patient motion artifacts. Marrow signal abnormalities at the amputation site of the base of the second proximal phalanx with surrounding soft tissue inflammation/edema raise concern for the presence of osteomyelitis at the stump. Electronically Signed   By: Ashley Royalty M.D.   On: 07/15/2016 20:08  Dg Chest Port 1 View  Result Date: 07/14/2016 CLINICAL DATA:  Fever with right foot infection EXAM: PORTABLE CHEST 1 VIEW COMPARISON:  02/11/2010 FINDINGS: Surgical wire at the lower cervical spine. Minimal atelectasis right base. No consolidation or effusion. Heart size upper normal. Minimal atherosclerosis. No pneumothorax. IMPRESSION: Minimal atelectasis at the right base. No acute infiltrate or edema. Electronically Signed   By: Donavan Foil M.D.   On: 07/14/2016 18:26   Ap / Lateral X-ray Right Foot  Result Date: 07/10/2016 CLINICAL DATA:  RIGHT foot pain, recent amputation of second toe in February, pain and that this toe EXAM: RIGHT FOOT - 2 VIEW COMPARISON:  05/12/2016 FINDINGS: Interval amputation of second toe through the proximal phalanx. Poorly defined osseous stump at the proximal phalanx, may related to resorption secondary to amputation though infection is not excluded with this appearance. Mild soft tissue swelling at the stump in more diffusely at the dorsum of the entire  RIGHT foot. Joint space narrowing at first MTP joint. Remaining joint spaces preserved. No acute fracture, dislocation or additional bone destruction. IMPRESSION: Prior amputation of RIGHT second toe through the proximal phalanx with poor definition of the osseous stump, could represent resorption secondary to resection but osteomyelitis is not excluded. If there is persistent clinical concern for osteomyelitis or soft tissue abscess in the RIGHT foot, consider MR. Electronically Signed   By: Lavonia Dana M.D.   On: 07/10/2016 16:19     CBC  Recent Labs Lab 07/20/16 1201 07/20/16 1335 07/23/16 0540  WBC 8.1 8.0 7.1  HGB 11.2* 10.4* 10.4*  HCT 31.5* 30.3* 30.5*  PLT 643* 669* 644*  MCV 84.9 84.6 87.4  MCH 30.2 29.1 29.8  MCHC 35.6 34.3 34.1  RDW 12.9 13.0 13.4  LYMPHSABS 2.0  --   --   MONOABS 0.8  --   --   EOSABS 0.2  --   --   BASOSABS 0.1  --   --     Chemistries   Recent Labs Lab 07/17/16 0552 07/20/16 1201 07/20/16 1335 07/23/16 0540  NA 128* 133*  --  132*  K 3.7 4.0  --  4.0  CL 95* 98*  --  97*  CO2 24 25  --  26  GLUCOSE 118* 89  --  108*  BUN 9 8  --  <5*  CREATININE 0.61 0.72 0.64 0.62  CALCIUM 9.0 9.2  --  9.2   ------------------------------------------------------------------------------------------------------------------ estimated creatinine clearance is 94.5 mL/min (by C-G formula based on SCr of 0.62 mg/dL). ------------------------------------------------------------------------------------------------------------------ No results for input(s): HGBA1C in the last 72 hours. ------------------------------------------------------------------------------------------------------------------ No results for input(s): CHOL, HDL, LDLCALC, TRIG, CHOLHDL, LDLDIRECT in the last 72 hours. ------------------------------------------------------------------------------------------------------------------ No results for input(s): TSH, T4TOTAL, T3FREE, THYROIDAB in the  last 72 hours.  Invalid input(s): FREET3 ------------------------------------------------------------------------------------------------------------------ No results for input(s): VITAMINB12, FOLATE, FERRITIN, TIBC, IRON, RETICCTPCT in the last 72 hours.  Coagulation profile  Recent Labs Lab 07/23/16 0540  INR 1.14    No results for input(s): DDIMER in the last 72 hours.  Cardiac Enzymes No results for input(s): CKMB, TROPONINI, MYOGLOBIN in the last 168 hours.  Invalid input(s): CK ------------------------------------------------------------------------------------------------------------------ Invalid input(s): POCBNP   CBG:  Recent Labs Lab 07/17/16 0742  GLUCAP 115*       Studies: No results found.    Lab Results  Component Value Date   HGBA1C 6.2 (H) 07/11/2016   Lab Results  Component Value Date   CREATININE 0.62 07/23/2016  Scheduled Meds: . amLODipine  10 mg Oral Daily  . aspirin EC  81 mg Oral Daily  . ceFEPime (MAXIPIME) IV  1 g Intravenous Q8H  . enoxaparin (LOVENOX) injection  40 mg Subcutaneous Q24H  . nicotine  21 mg Transdermal Daily  . senna-docusate  2 tablet Oral QHS  . sodium chloride flush  3 mL Intravenous Q12H  . sodium chloride  1 g Oral BID WC  . vancomycin  1,500 mg Intravenous Q12H   Continuous Infusions: . sodium chloride 100 mL/hr at 07/23/16 0612     LOS: 3 days    Time spent: >30 MINS    Reyne Dumas  [facility] Pager [phone]. If 7PM-7AM, please contact night-coverage at [email or web address], password [facility] 07/23/2016, 8:55 AM  LOS: 3 days

## 2016-07-23 NOTE — Progress Notes (Signed)
  Progress Note    07/23/2016 7:15 AM * No surgery date entered *  Subjective:  No new issues, right foot hurting  Vitals:   07/22/16 2107 07/23/16 0608  BP: (!) 146/63 (!) 146/94  Pulse: (!) 102 89  Resp: 18 18  Temp: 98.1 F (36.7 C) 98.3 F (36.8 C)    Physical Exam: [email or web address] Non labored respirations Palpable femoral pulses bilaterally, palpable left popliteal pulse Right foot wound cdi  CBC    Component Value Date/Time   WBC 7.1 07/23/2016 0540   RBC 3.49 (L) 07/23/2016 0540   HGB 10.4 (L) 07/23/2016 0540   HCT 30.5 (L) 07/23/2016 0540   PLT 644 (H) 07/23/2016 0540   MCV 87.4 07/23/2016 0540   MCH 29.8 07/23/2016 0540   MCHC 34.1 07/23/2016 0540   RDW 13.4 07/23/2016 0540   LYMPHSABS 2.0 07/20/2016 1201   MONOABS 0.8 07/20/2016 1201   EOSABS 0.2 07/20/2016 1201   BASOSABS 0.1 07/20/2016 1201    BMET    Component Value Date/Time   NA 132 (L) 07/23/2016 0540   K 4.0 07/23/2016 0540   CL 97 (L) 07/23/2016 0540   CO2 26 07/23/2016 0540   GLUCOSE 108 (H) 07/23/2016 0540   BUN <5 (L) 07/23/2016 0540   CREATININE 0.62 07/23/2016 0540   CALCIUM 9.2 07/23/2016 0540   GFRNONAA >60 07/23/2016 0540   GFRAA >60 07/23/2016 0540    INR    Component Value Date/Time   INR 1.14 07/23/2016 0540     Intake/Output Summary (Last 24 hours) at 07/23/16 0715 Last data filed at 07/23/16 0608  Gross per 24 hour  Intake             1520 ml  Output             2570 ml  Net            -1050 ml     Assessment:  66 y.o. male is here with non healing right 2nd toe amputation site, high risk for transtibial amputation  Plan: Aortogram with possible Right lower extremity intervention today. I again discussed his high risk of requiring more proximal amputation.    Calvin Mckee C. Donzetta Matters, MD [facility] [place] Office: [phone] Pager: [phone]  07/23/2016 7:15 AM

## 2016-07-23 NOTE — Op Note (Signed)
Procedure: Abdominal aortogram with right lower extremity runoff right superficial femoral popliteal stent (6 x 100 mm Abbott 2)  Preoperative diagnosis: Gangrene right foot  Postoperative diagnosis: Same  Anesthesia: Local with IV sedation  Operative findings: #1 chronic occlusion right superficial femoral artery primarily stented to 0 residual stenosis, primarily one vessel in-line flow runoff via the anterior tibial artery  Operative details: After obtaining informed consent, the patient was brought to the [place] lab. The patient was placed in supine position the Angio table. Both groins were prepped and draped in usual sterile fashion. Local anesthesia was ensured of the left common femoral artery. Ultrasound was used to identify the left common femoral artery and femoral bifurcation.  Next an introducer needle was used to cannulate the left common femoral artery without difficulty. 25 versacore wire was then threaded up in the abdominal aorta under fluoroscopic guidance. A 5 French sheath was split over the guidewire and the left common femoral artery. This was thoroughly flushed with heparin saline. I French pigtail catheter was advanced over the guidewire abdominal aortogram was obtained in AP projection. The infrarenal abdominal aorta is patent. The left and right renal arteries are patent. Left and right common external and internal iliac arteries are patent.  At this point the procedure catheter was removed over a guidewire and a 5 [place] crossover catheter used to select the catheterized first the right common iliac followed by the distal right external iliac artery. An 035 versacore wire was then placed through this down into the right superficial femoral artery. The 5 French sheath was removed and replaced with a 7 [place] destination sheath. This was advanced up and over the aortic bifurcation. The patient was given 7000 units of IV heparin. He was given a subsequent dose of 2000 units of  heparin during the case. ACT was confirmed greater than 200. At this point an 035 angled Glidewire was advanced down into the right superficial femoral artery and an 035 quick cross catheter advanced over this. The wire was refluxed on itself and using a push and advanced technique I was able to cross the chronic occlusion in the right superficial femoral artery and then reenter the popliteal artery. This was confirmed with contrast angiogram. I then proceeded to angioplasty the entire chronic total occlusion this required 2 inflations with a 6 x 100 mm balloon to 6 atm for 1 minute each. I decided at this point to primarily stent the lesion since her were several areas of dissection and also due to the fact that the patient's runoff was compromised and I was worried about distal embolization. 2 6 x 100 Abbott self-expanding stents were advanced and centered on the lesion and then deployed using roadmapping techniques. At this point the 6 x 100 balloon was then advanced back over the guidewire and the distal stent postdilated to 6 atm for 30 seconds. During the course of pulling the balloon back to balloon the top segment versacore wire that we have been using with a lock in the central portion became disconnected. Therefore I had to pull back the balloon and the wire and temporarily lost wire access. We were able to reestablish wire access by advancing an 035 angled Glidewire through the shaft of the balloon and down into the distal popliteal artery. This was confirmed with contrast angiogram. I then ballooned the top stent with the same balloon 6 x 100-6 atm for 30 seconds. A completion arteriogram was then performed showing wide patency of the right superficial femoral  artery. The runoff via the anterior tibial artery was intact. There was filling also the peroneal artery almost this was not intact in-line flow as per preprocedure.  The patient tolerated the procedure well the balloon was removed over the  guidewire and the sheath pulled back down in the left hemipelvis. The patient was then taken to the holding area in stable condition.  Operative management: Successful angioplasty and stenting of right superficial femoral artery with one-vessel runoff via the anterior tibial artery. Dr. Donzetta Matters will continue to evaluate the patient to assess for wound healing of the right foot. At the foot does not improve the patient will have a below-knee amputation next week.  Ruta Hinds, MD [facility] [place] Office: [phone] Pager: [phone]

## 2016-07-23 NOTE — Progress Notes (Signed)
38F sheath pulled from L femoral artery by Tessie Eke. 25 minutes manual pressure applied. Site level 0 with no hematoma noted. Site dressed with tegaderm and 4x4. Instructions given to pt. and he verbalized that he understands. Pt does continue to wiggle in the bed and lift his head up. Bedrest begins at 1310. DP +1.

## 2016-07-24 DIAGNOSIS — M8660 Other chronic osteomyelitis, unspecified site: Secondary | ICD-10-CM

## 2016-07-24 LAB — CBC
HEMATOCRIT: 28.5 % — AB (ref 39.0–52.0)
HEMOGLOBIN: 9.8 g/dL — AB (ref 13.0–17.0)
MCH: 29.9 pg (ref 26.0–34.0)
MCHC: 34.4 g/dL (ref 30.0–36.0)
MCV: 86.9 fL (ref 78.0–100.0)
Platelets: 622 10*3/uL — ABNORMAL HIGH (ref 150–400)
RBC: 3.28 MIL/uL — AB (ref 4.22–5.81)
RDW: 13.1 % (ref 11.5–15.5)
WBC: 9.6 10*3/uL (ref 4.0–10.5)

## 2016-07-24 LAB — BASIC METABOLIC PANEL
ANION GAP: 11 (ref 5–15)
BUN: 6 mg/dL (ref 6–20)
CHLORIDE: 95 mmol/L — AB (ref 101–111)
CO2: 24 mmol/L (ref 22–32)
Calcium: 9.3 mg/dL (ref 8.9–10.3)
Creatinine, Ser: 0.67 mg/dL (ref 0.61–1.24)
GFR calc non Af Amer: >60 mL/min (ref >60)
Glucose, Bld: 109 mg/dL — ABNORMAL HIGH (ref 65–99)
Potassium: 4 mmol/L (ref 3.5–5.1)
Sodium: 130 mmol/L — ABNORMAL LOW (ref 135–145)

## 2016-07-24 NOTE — Progress Notes (Signed)
[facility] PROGRESS NOTE  Calvin Mckee [facility]:[id number] DOB: Jul 02, 1950 DOA: 07/20/2016   PCP: Pcp Not In System     Assessment/Plan: Active Problems:   Osteomyelitis ([place])   Gangrene of right foot ([place])   SIAD (syndrome of inappropriate antidiuresis) ([facility])   Benign essential HTN   Right foot infection   65 y.o.malewith medical history significant of Recent Right 2nd toe Amputation (been on Abx with Keflex, Clindamycin, and Doxycycline), Tobacco Abuse, EtOH use, Polysubstance Abuse including recent Tobacco Abuse ,Patient recently had a 2nd Right Toe Amputation on March 6 at [facility] in [place] and recently saw Dr. Erlinda Hong at [facility] 07/06/16. After that he was admitted between 3/31>4/7 for right foot second toe ulceration and cellulitis. Treated with IV antibiotics including IV vancomycin and cefepime for 8 days. [facility] was consulted and recommended to continue antibiotic and outpatient follow-up. Patient admitted back on 4/10, for worsening appearance of the right foot. 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 Donzetta Matters will continue to evaluate the patient to assess for wound healing of the right foot. At the foot does not improve the patient will have a below-knee amputation next week  Thrombocytosis - likely acute phase reactant type of response  Chronic hyponatremia- SIADH - cont sodium tab and add fluid restriction, sodium has been ranging from 1:30 to 132   Hypertension continue Norvasc,  hydralazine prn   EtOH Use Concern for Withdrawal of Alcohol and Opiates -Patient states he drinks at leas 2 beers Daily -CIWA Protocol with Lorazepam  Tobacco Abuse Continue nicotine patch -Smoking Cessation Counseling Given  Hx of Polysubstance Abuse --Has Hx of Cocaine Abuse .  Discontinued Percocet, start oxycodone 10 mg every 4 as needed , due to uncontrolled pain   DVT prophylaxsis Lovenox  Code Status:  Full code    Family Communication: Discussed in detail with the patient, all imaging results, lab results explained to the patient   Disposition Plan:  Final vascular  recommendations pending regarding amputation      Consultants:  Orthopedics-Dr Sharol Given  Vascular surgery  Procedures:  None  Antibiotics: Anti-infectives    Start     Dose/Rate Route Frequency Ordered Stop   07/23/16 1600  vancomycin (VANCOCIN) 1,500 mg in sodium chloride 0.9 % 500 mL IVPB     1,500 mg 250 mL/hr over 120 Minutes Intravenous Every 12 hours 07/23/16 1525     07/23/16 1600  ceFEPIme (MAXIPIME) 1 g in dextrose 5 % 50 mL IVPB     1 g 100 mL/hr over 30 Minutes Intravenous Every 8 hours 07/23/16 1525     07/20/16 2200  ceFEPIme (MAXIPIME) 1 g in dextrose 5 % 50 mL IVPB  Status:  Discontinued     1 g 100 mL/hr over 30 Minutes Intravenous Every 8 hours 07/20/16 1444 07/23/16 1525   07/20/16 2000  vancomycin (VANCOCIN) 1,500 mg in sodium chloride 0.9 % 500 mL IVPB  Status:  Discontinued     1,500 mg 250 mL/hr over 120 Minutes Intravenous Every 12 hours 07/20/16 1451 07/23/16 1525  07/20/16 1345  ceFEPIme (MAXIPIME) 2 g in dextrose 5 % 50 mL IVPB     2 g 100 mL/hr over 30 Minutes Intravenous  Once 07/20/16 1331 07/20/16 1418   07/20/16 1345  vancomycin (VANCOCIN) IVPB 1000 mg/200 mL premix     1,000 mg 200 mL/hr over 60 Minutes Intravenous  Once 07/20/16 1331 07/20/16 1559         HPI/Subjectiv Very anxious about upcoming surgery,pain controlled   Objective: Vitals:    07/23/16 1335 07/23/16 1937 07/24/16 0501 07/24/16 0847  BP: (!) 154/86 (!) 170/88 (!) 163/78 (!) 141/62  Pulse: (!) 101 99  (!) 113  Resp:  20 18   Temp: 98.7 F (37.1 C) 98.7 F (37.1 C) 98.6 F (37 C)   TempSrc: Oral Oral Oral   SpO2: 98% 98% 99%   Weight:      Height:        Intake/Output Summary (Last 24 hours) at 07/24/16 1014 Last data filed at 07/24/16 0854  Gross per 24 hour  Intake                0 ml  Output             1285 ml  Net            -1285 ml    Exam:  Examination:  General exam: Appears calm and comfortable  Respiratory system: Clear to auscultation. Respiratory effort normal. Cardiovascular system: S1 & S2 heard, RRR. No JVD, murmurs, rubs, gallops or clicks. No pedal edema. Gastrointestinal system: Abdomen is nondistended, soft and nontender. No organomegaly or masses felt. Normal bowel sounds heard. Central nervous system:   No focal neurological deficits. Extremities: right foot covered in dressing  Skin: No rashes, lesions or ulcers Psychiatry: agitated      Data Reviewed: I have personally reviewed following labs and imaging studies  Micro Results Recent Results (from the past 240 hour(s))  MRSA PCR Screening     Status: None   Collection Time: 07/20/16  6:01 PM  Result Value Ref Range Status   MRSA by PCR NEGATIVE NEGATIVE Final    Comment:        The GeneXpert MRSA Assay (FDA approved for NASAL specimens only), is one component of a comprehensive MRSA colonization surveillance program. It is not intended to diagnose MRSA infection nor to guide or monitor treatment for MRSA infections.   Surgical pcr screen     Status: None   Collection Time: 07/23/16  3:58 AM  Result Value Ref Range Status   MRSA, PCR NEGATIVE NEGATIVE Final   Staphylococcus aureus NEGATIVE NEGATIVE Final    Comment:        The Xpert SA Assay (FDA approved for NASAL specimens in patients over 62 years of age), is one component of a comprehensive  surveillance program.  Test performance has been validated by [facility] for patients greater than or equal to 6 year old. It is not intended to diagnose infection nor to guide or monitor treatment.     Radiology Reports Mr Foot Right Wo Contrast  Result Date: 07/15/2016 CLINICAL DATA:  Right foot pain worsening since Saturday. Leg started swelling and patient has been on unable to walk nor put any pressure on it. EXAM: MRI OF THE RIGHT FOREFOOT WITHOUT CONTRAST TECHNIQUE: Multiplanar, multisequence MR imaging of the right forefoot was performed. No intravenous contrast was administered. COMPARISON:  Radiographs of the right foot from 07/15/2016 FINDINGS: Despite patient having received the  maximum amount of medication, the study is markedly limited due to motion artifacts. Bones/Joint/Cartilage The patient is status post amputation at the base of the right proximal phalanx with suggestion of T1 hypointensity in T2 fat sat hyperintensity at site of amputation. Findings are suspicious for osteomyelitis. Ligaments Suboptimally assessed Muscles and Tendons Suboptimally assessed Soft tissues Soft tissue edema about the forefoot special along the plantar aspect of the first and second digits. IMPRESSION: Markedly compromised study due to patient motion artifacts. Marrow signal abnormalities at the amputation site of the base of the second proximal phalanx with surrounding soft tissue inflammation/edema raise concern for the presence of osteomyelitis at the stump. Electronically Signed   By: Ashley Royalty M.D.   On: 07/15/2016 20:08   Dg Chest Port 1 View  Result Date: 07/14/2016 CLINICAL DATA:  Fever with right foot infection EXAM: PORTABLE CHEST 1 VIEW COMPARISON:  02/11/2010 FINDINGS: Surgical wire at the lower cervical spine. Minimal atelectasis right base. No consolidation or effusion. Heart size upper normal. Minimal atherosclerosis. No pneumothorax. IMPRESSION: Minimal atelectasis at the right base.  No acute infiltrate or edema. Electronically Signed   By: Donavan Foil M.D.   On: 07/14/2016 18:26   Ap / Lateral X-ray Right Foot  Result Date: 07/10/2016 CLINICAL DATA:  RIGHT foot pain, recent amputation of second toe in February, pain and that this toe EXAM: RIGHT FOOT - 2 VIEW COMPARISON:  05/12/2016 FINDINGS: Interval amputation of second toe through the proximal phalanx. Poorly defined osseous stump at the proximal phalanx, may related to resorption secondary to amputation though infection is not excluded with this appearance. Mild soft tissue swelling at the stump in more diffusely at the dorsum of the entire RIGHT foot. Joint space narrowing at first MTP joint. Remaining joint spaces preserved. No acute fracture, dislocation or additional bone destruction. IMPRESSION: Prior amputation of RIGHT second toe through the proximal phalanx with poor definition of the osseous stump, could represent resorption secondary to resection but osteomyelitis is not excluded. If there is persistent clinical concern for osteomyelitis or soft tissue abscess in the RIGHT foot, consider MR. Electronically Signed   By: Lavonia Dana M.D.   On: 07/10/2016 16:19     CBC  Recent Labs Lab 07/20/16 1201 07/20/16 1335 07/23/16 0540 07/24/16 0325  WBC 8.1 8.0 7.1 9.6  HGB 11.2* 10.4* 10.4* 9.8*  HCT 31.5* 30.3* 30.5* 28.5*  PLT 643* 669* 644* 622*  MCV 84.9 84.6 87.4 86.9  MCH 30.2 29.1 29.8 29.9  MCHC 35.6 34.3 34.1 34.4  RDW 12.9 13.0 13.4 13.1  LYMPHSABS 2.0  --   --   --   MONOABS 0.8  --   --   --   EOSABS 0.2  --   --   --   BASOSABS 0.1  --   --   --     Chemistries   Recent Labs Lab 07/20/16 1201 07/20/16 1335 07/23/16 0540 07/24/16 0325  NA 133*  --  132* 130*  K 4.0  --  4.0 4.0  CL 98*  --  97* 95*  CO2 25  --  26 24  GLUCOSE 89  --  108* 109*  BUN 8  --  <5* 6  CREATININE 0.72 0.64 0.62 0.67  CALCIUM 9.2  --  9.2 9.3    ------------------------------------------------------------------------------------------------------------------ estimated creatinine clearance is 94.5 mL/min (by C-G formula based on SCr of 0.67 mg/dL). ------------------------------------------------------------------------------------------------------------------ No results for input(s): HGBA1C in the last 72 hours. ------------------------------------------------------------------------------------------------------------------ No results  for input(s): CHOL, HDL, LDLCALC, TRIG, CHOLHDL, LDLDIRECT in the last 72 hours. ------------------------------------------------------------------------------------------------------------------ No results for input(s): TSH, T4TOTAL, T3FREE, THYROIDAB in the last 72 hours.  Invalid input(s): FREET3 ------------------------------------------------------------------------------------------------------------------ No results for input(s): VITAMINB12, FOLATE, FERRITIN, TIBC, IRON, RETICCTPCT in the last 72 hours.  Coagulation profile  Recent Labs Lab 07/23/16 0540  INR 1.14    No results for input(s): DDIMER in the last 72 hours.  Cardiac Enzymes No results for input(s): CKMB, TROPONINI, MYOGLOBIN in the last 168 hours.  Invalid input(s): CK ------------------------------------------------------------------------------------------------------------------ Invalid input(s): POCBNP   CBG: No results for input(s): GLUCAP in the last 168 hours.     Studies: No results found.    Lab Results  Component Value Date   HGBA1C 6.2 (H) 07/11/2016   Lab Results  Component Value Date   CREATININE 0.67 07/24/2016       Scheduled Meds: . amLODipine  10 mg Oral Daily  . aspirin EC  81 mg Oral Daily  . ceFEPime (MAXIPIME) IV  1 g Intravenous Q8H  . clopidogrel  300 mg Oral Once  . clopidogrel  75 mg Oral Daily  . docusate sodium  100 mg Oral Daily  . enoxaparin (LOVENOX) injection   40 mg Subcutaneous Q24H  . nicotine  21 mg Transdermal Daily  . senna-docusate  2 tablet Oral QHS  . sodium chloride flush  3 mL Intravenous Q12H  . sodium chloride  1 g Oral BID WC  . vancomycin  1,500 mg Intravenous Q12H   Continuous Infusions: . sodium chloride 100 mL/hr at 07/23/16 0612  . sodium chloride 75 mL/hr at 07/23/16 1343     LOS: 4 days    Time spent: >30 MINS    Reyne Dumas  [facility] Pager [phone]. If 7PM-7AM, please contact night-coverage at [email or web address], password [facility] 07/24/2016, 10:14 AM  LOS: 4 days

## 2016-07-24 NOTE — Progress Notes (Signed)
Patient up all night. Restless, Oxycodone, Morphine and Xanax given. Patient being inappropriate, continues to masturbate in front of staff.

## 2016-07-25 NOTE — Progress Notes (Signed)
  Progress Note    07/25/2016 8:37 AM 2 Days Post-Op  Subjective:  Tearful; c/o pain in his foot and he is not getting strong enough pain medicine.  Tm 99.2 now afebrile Hr 90's-100's NSR 767'H-419'F systolic 79% RA  Vitals:   07/25/16 0503 07/25/16 0539  BP: (!) 159/85 (!) 151/65  Pulse: 96 91  Resp:    Temp: 98.3 F (36.8 C)     Physical Exam: Extremities:  Gangrenous changes to right dorsum and plantar aspect of foot.  Malodorous.    CBC    Component Value Date/Time   WBC 9.6 07/24/2016 0325   RBC 3.28 (L) 07/24/2016 0325   HGB 9.8 (L) 07/24/2016 0325   HCT 28.5 (L) 07/24/2016 0325   PLT 622 (H) 07/24/2016 0325   MCV 86.9 07/24/2016 0325   MCH 29.9 07/24/2016 0325   MCHC 34.4 07/24/2016 0325   RDW 13.1 07/24/2016 0325   LYMPHSABS 2.0 07/20/2016 1201   MONOABS 0.8 07/20/2016 1201   EOSABS 0.2 07/20/2016 1201   BASOSABS 0.1 07/20/2016 1201    BMET    Component Value Date/Time   NA 130 (L) 07/24/2016 0325   K 4.0 07/24/2016 0325   CL 95 (L) 07/24/2016 0325   CO2 24 07/24/2016 0325   GLUCOSE 109 (H) 07/24/2016 0325   BUN 6 07/24/2016 0325   CREATININE 0.67 07/24/2016 0325   CALCIUM 9.3 07/24/2016 0325   GFRNONAA >60 07/24/2016 0325   GFRAA >60 07/24/2016 0325    INR    Component Value Date/Time   INR 1.14 07/23/2016 0540     Intake/Output Summary (Last 24 hours) at 07/25/16 0837 Last data filed at 07/25/16 0502  Gross per 24 hour  Intake              360 ml  Output             2760 ml  Net            -2400 ml     Assessment:  65 y.o. male is s/p:  Abdominal aortogram with right lower extremity runoff right superficial femoral popliteal stent (6 x 100 mm Abbott 2)  2 Days Post-Op  Plan: -pt's right foot dressing removed-foot is not salvageable.  Will need right TMA vs right BKA.  Dr. Donzetta Matters will re-assess pt tomorrow and make further decisions.    Leontine Locket, PA-C [facility] [phone] 07/25/2016 8:37  AM   Ischemic changes to the dorsum of the foot on the right.  I think the patient would be best treated with a below-knee amputation, however he does not want to lose his foot.  He could potentially have a high transmetatarsal amputation, however I think the likelihood of getting this to heal would be small.  I will discuss this further with Dr. Donzetta Matters, so that we can proceed with surgery.  The patient is having a significant amount of pain from his ischemic changes.  Annamarie Major

## 2016-07-25 NOTE — Progress Notes (Signed)
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 Erlinda Hong at [facility] 07/06/16. After that he was admitted between 3/31>4/7 for right foot second toe ulceration and cellulitis. Treated with IV antibiotics including IV vancomycin and cefepime for 8 days. [facility] was consulted and recommended to continue antibiotic and outpatient follow-up. Patient readmitted for worsening right foot cellulitis.  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 Vascular surgery  recomends right TMA vs right BKA Wound care Dr Donzetta Matters Foot deemed not salvageable Continue IV vancomycin and IV cefepime, day #6 Thrombocytosis Poss acute phase reactant type of response  Chronic hyponatremia- SIADH: fluid restriction as needed  EtOH Abuse: -Patient states he drinks at leas 2 beers Daily -CIWA Protocol with Lorazepam  Tobacco Abuse Continue nicotine patch -Smoking Cessation Counseling Given  Hx of Polysubstance Abuse --Has Hx of Cocaine Abuse .  Discontinued Percocet, start oxycodone 10 mg every 4 as needed , due to uncontrolled pain   DVT prophylaxsis Lovenox  Code Status:  Full code    Family Communication: Disposition Plan:  To be deteremined     Consultants:  Orthopedics-Dr Sharol Given  Vascular  surgery  Procedures:  None  Antibiotics: Anti-infectives    Start     Dose/Rate Route Frequency Ordered Stop   07/23/16 1600  vancomycin (VANCOCIN) 1,500 mg in sodium chloride 0.9 % 500 mL IVPB     1,500 mg 250 mL/hr over 120 Minutes Intravenous Every 12 hours 07/23/16 1525     07/23/16 1600  ceFEPIme (MAXIPIME) 1 g in dextrose 5 % 50 mL IVPB     1 g 100 mL/hr over 30 Minutes Intravenous Every 8 hours 07/23/16 1525     07/20/16 2200  ceFEPIme (MAXIPIME) 1 g in dextrose 5 % 50 mL IVPB  Status:  Discontinued     1 g 100 mL/hr over 30 Minutes Intravenous Every 8 hours 07/20/16 1444 07/23/16 1525   07/20/16 2000  vancomycin (VANCOCIN) 1,500 mg in sodium chloride 0.9 % 500 mL IVPB  Status:  Discontinued     1,500 mg 250 mL/hr over 120 Minutes Intravenous Every 12 hours 07/20/16 1451 07/23/16 1525   07/20/16 1345  ceFEPIme (MAXIPIME) 2 g in dextrose 5 % 50 mL IVPB     2 g 100 mL/hr over 30 Minutes Intravenous  Once 07/20/16 1331 07/20/16 1418   07/20/16 1345  vancomycin (VANCOCIN) IVPB 1000 mg/200 mL premix     1,000 mg 200 mL/hr over 60 Minutes Intravenous  Once 07/20/16 1331 07/20/16 1559         HPI/Subjectiv Resting comfortably, no acute events reported overnight   Objective: Vitals:   07/25/16 0503 07/25/16 0539 07/25/16 0909 07/25/16 1329  BP: (!) 159/85 (!) 151/65 (!) 150/68 (!) 147/74  Pulse: 96 91 95 97  Resp:      Temp: 98.3  F (36.8 C)   99.7 F (37.6 C)  TempSrc: Axillary   Oral  SpO2: 96%  99% 97%  Weight:      Height:        Intake/Output Summary (Last 24 hours) at 07/25/16 1516 Last data filed at 07/25/16 0502  Gross per 24 hour  Intake              360 ml  Output             1975 ml  Net            -1615 ml    Exam:  Examination:  General exam: NAD Respiratory system: Clear to auscultation. Respiratory effort normal. Cardiovascular system: S1 & S2 heard, RRR. No JVD, murmurs, rubs, gallops or clicks. No pedal edema. Gastrointestinal system:  Abdomen is nondistended, soft and nontender. No organomegaly or masses felt. Normal bowel sounds heard. Central nervous system:   No focal neurological deficits. Extremities: right foot -mild on Doris gangrenous continues to dorsum and plantar aspect of affected foot      Data Reviewed: I have personally reviewed following labs and imaging studies  Micro Results Recent Results (from the past 240 hour(s))  MRSA PCR Screening     Status: None   Collection Time: 07/20/16  6:01 PM  Result Value Ref Range Status   MRSA by PCR NEGATIVE NEGATIVE Final    Comment:        The GeneXpert MRSA Assay (FDA approved for NASAL specimens only), is one component of a comprehensive MRSA colonization surveillance program. It is not intended to diagnose MRSA infection nor to guide or monitor treatment for MRSA infections.   Surgical pcr screen     Status: None   Collection Time: 07/23/16  3:58 AM  Result Value Ref Range Status   MRSA, PCR NEGATIVE NEGATIVE Final   Staphylococcus aureus NEGATIVE NEGATIVE Final    Comment:        The Xpert SA Assay (FDA approved for NASAL specimens in patients over 53 years of age), is one component of a comprehensive surveillance program.  Test performance has been validated by [facility] for patients greater than or equal to 21 year old. It is not intended to diagnose infection nor to guide or monitor treatment.     Radiology Reports Mr Foot Right Wo Contrast  Result Date: 07/15/2016 CLINICAL DATA:  Right foot pain worsening since Saturday. Leg started swelling and patient has been on unable to walk nor put any pressure on it. EXAM: MRI OF THE RIGHT FOREFOOT WITHOUT CONTRAST TECHNIQUE: Multiplanar, multisequence MR imaging of the right forefoot was performed. No intravenous contrast was administered. COMPARISON:  Radiographs of the right foot from 07/15/2016 FINDINGS: Despite patient having received the maximum amount of medication, the study is markedly  limited due to motion artifacts. Bones/Joint/Cartilage The patient is status post amputation at the base of the right proximal phalanx with suggestion of T1 hypointensity in T2 fat sat hyperintensity at site of amputation. Findings are suspicious for osteomyelitis. Ligaments Suboptimally assessed Muscles and Tendons Suboptimally assessed Soft tissues Soft tissue edema about the forefoot special along the plantar aspect of the first and second digits. IMPRESSION: Markedly compromised study due to patient motion artifacts. Marrow signal abnormalities at the amputation site of the base of the second proximal phalanx with surrounding soft tissue inflammation/edema raise concern for the presence of osteomyelitis at the stump. Electronically Signed   By: Ashley Royalty M.D.   On: 07/15/2016  20:08   Dg Chest Port 1 View  Result Date: 07/14/2016 CLINICAL DATA:  Fever with right foot infection EXAM: PORTABLE CHEST 1 VIEW COMPARISON:  02/11/2010 FINDINGS: Surgical wire at the lower cervical spine. Minimal atelectasis right base. No consolidation or effusion. Heart size upper normal. Minimal atherosclerosis. No pneumothorax. IMPRESSION: Minimal atelectasis at the right base. No acute infiltrate or edema. Electronically Signed   By: Donavan Foil M.D.   On: 07/14/2016 18:26   Ap / Lateral X-ray Right Foot  Result Date: 07/10/2016 CLINICAL DATA:  RIGHT foot pain, recent amputation of second toe in February, pain and that this toe EXAM: RIGHT FOOT - 2 VIEW COMPARISON:  05/12/2016 FINDINGS: Interval amputation of second toe through the proximal phalanx. Poorly defined osseous stump at the proximal phalanx, may related to resorption secondary to amputation though infection is not excluded with this appearance. Mild soft tissue swelling at the stump in more diffusely at the dorsum of the entire RIGHT foot. Joint space narrowing at first MTP joint. Remaining joint spaces preserved. No acute fracture, dislocation or additional  bone destruction. IMPRESSION: Prior amputation of RIGHT second toe through the proximal phalanx with poor definition of the osseous stump, could represent resorption secondary to resection but osteomyelitis is not excluded. If there is persistent clinical concern for osteomyelitis or soft tissue abscess in the RIGHT foot, consider MR. Electronically Signed   By: Lavonia Dana M.D.   On: 07/10/2016 16:19     CBC  Recent Labs Lab 07/20/16 1201 07/20/16 1335 07/23/16 0540 07/24/16 0325  WBC 8.1 8.0 7.1 9.6  HGB 11.2* 10.4* 10.4* 9.8*  HCT 31.5* 30.3* 30.5* 28.5*  PLT 643* 669* 644* 622*  MCV 84.9 84.6 87.4 86.9  MCH 30.2 29.1 29.8 29.9  MCHC 35.6 34.3 34.1 34.4  RDW 12.9 13.0 13.4 13.1  LYMPHSABS 2.0  --   --   --   MONOABS 0.8  --   --   --   EOSABS 0.2  --   --   --   BASOSABS 0.1  --   --   --     Chemistries   Recent Labs Lab 07/20/16 1201 07/20/16 1335 07/23/16 0540 07/24/16 0325  NA 133*  --  132* 130*  K 4.0  --  4.0 4.0  CL 98*  --  97* 95*  CO2 25  --  26 24  GLUCOSE 89  --  108* 109*  BUN 8  --  <5* 6  CREATININE 0.72 0.64 0.62 0.67  CALCIUM 9.2  --  9.2 9.3   ------------------------------------------------------------------------------------------------------------------ estimated creatinine clearance is 94.5 mL/min (by C-G formula based on SCr of 0.67 mg/dL). ------------------------------------------------------------------------------------------------------------------ No results for input(s): HGBA1C in the last 72 hours. ------------------------------------------------------------------------------------------------------------------ No results for input(s): CHOL, HDL, LDLCALC, TRIG, CHOLHDL, LDLDIRECT in the last 72 hours. ------------------------------------------------------------------------------------------------------------------ No results for input(s): TSH, T4TOTAL, T3FREE, THYROIDAB in the last 72 hours.  Invalid input(s):  FREET3 ------------------------------------------------------------------------------------------------------------------ No results for input(s): VITAMINB12, FOLATE, FERRITIN, TIBC, IRON, RETICCTPCT in the last 72 hours.  Coagulation profile  Recent Labs Lab 07/23/16 0540  INR 1.14    No results for input(s): DDIMER in the last 72 hours.  Cardiac Enzymes No results for input(s): CKMB, TROPONINI, MYOGLOBIN in the last 168 hours.  Invalid input(s): CK ------------------------------------------------------------------------------------------------------------------ Invalid input(s): POCBNP   CBG: No results for input(s): GLUCAP in the last 168 hours.     Studies: No results found.    Lab Results  Component Value Date  HGBA1C 6.2 (H) 07/11/2016   Lab Results  Component Value Date   CREATININE 0.67 07/24/2016       Scheduled Meds: . amLODipine  10 mg Oral Daily  . aspirin EC  81 mg Oral Daily  . ceFEPime (MAXIPIME) IV  1 g Intravenous Q8H  . clopidogrel  75 mg Oral Daily  . docusate sodium  100 mg Oral Daily  . enoxaparin (LOVENOX) injection  40 mg Subcutaneous Q24H  . nicotine  21 mg Transdermal Daily  . senna-docusate  2 tablet Oral QHS  . sodium chloride flush  3 mL Intravenous Q12H  . sodium chloride  1 g Oral BID WC  . vancomycin  1,500 mg Intravenous Q12H   Continuous Infusions: . sodium chloride 100 mL/hr at 07/23/16 0612  . sodium chloride 75 mL/hr at 07/25/16 0448     LOS: 5 days    Time spent: >30 MINS    OSEI-BONSU,Desarea [place]  [facility] Pager [phone]. If 7PM-7AM, please contact night-coverage at [email or web address], password [facility] 07/25/2016, 3:16 PM  LOS: 5 days

## 2016-07-25 NOTE — Progress Notes (Signed)
Patient stable during 7 a to 7 p shift, constantly requesting pain medication, rating pain at 10.  Nurse often walked into room and found patient laying across bed or across table asleep after being medicated.  Patient is impulsive, little insight into situation.

## 2016-07-26 DIAGNOSIS — I10 Essential (primary) hypertension: Secondary | ICD-10-CM

## 2016-07-26 LAB — BASIC METABOLIC PANEL
Anion gap: 10 (ref 5–15)
BUN: 6 mg/dL (ref 6–20)
CHLORIDE: 97 mmol/L — AB (ref 101–111)
CO2: 22 mmol/L (ref 22–32)
Calcium: 8.6 mg/dL — ABNORMAL LOW (ref 8.9–10.3)
Creatinine, Ser: 0.66 mg/dL (ref 0.61–1.24)
GFR calc Af Amer: >60 mL/min (ref >60)
GFR calc non Af Amer: >60 mL/min (ref >60)
GLUCOSE: 132 mg/dL — AB (ref 65–99)
POTASSIUM: 3.9 mmol/L (ref 3.5–5.1)
Sodium: 129 mmol/L — ABNORMAL LOW (ref 135–145)

## 2016-07-26 LAB — POCT ACTIVATED CLOTTING TIME: Activated Clotting Time: 186 seconds

## 2016-07-26 MED FILL — Morphine Sulfate Inj 4 MG/ML: INTRAMUSCULAR | Qty: 0.5 | Status: AC

## 2016-07-26 NOTE — Progress Notes (Signed)
Pharmacy Antibiotic Note  Calvin Mckee is a 66 y.o. male with recurrent R foot cellulitis/gangrene on day 7 cefepime and vancomycin. He is noted s/p angioplasty and stenting of right superficial femoral artery on 4/13. R TMA vs. R BKA on 4/17. -vancomycin trough therapeutic on 4/13 -SCr 0.67 stable on 4/14  Plan: Cefepime 1gm IV q8h Vancomycin 1500mg  IV q12h Monitor clinical progress, c/s, renal function (BMET today) Repeat vancomycin trough as indicated Right TMA vs right BKA on 4/17 - f/u LOT post-op  Height: 5\' 9"  (175.3 cm) Weight: 165 lb (74.8 kg) IBW/kg (Calculated) : 70.7  Temp (24hrs), Avg:99.1 F (37.3 C), Min:98.2 F (36.8 C), Max:99.7 F (37.6 C)   Recent Labs Lab 07/20/16 1201 07/20/16 1335 07/23/16 0540 07/23/16 0843 07/24/16 0325  WBC 8.1 8.0 7.1  --  9.6  CREATININE 0.72 0.64 0.62  --  0.67  VANCOTROUGH  --   --   --  18  --     Estimated Creatinine Clearance: 94.5 mL/min (by C-G formula based on SCr of 0.67 mg/dL).    No Known Allergies  Antimicrobials this admission: 4/10 Cefepime >>  4/10 Vancomycin >>  4/13 vancomycin trough: 18 on 1500mg  IV q12h (collected about 1 hr late)                                Microbiology results: 4/13 MRSA PCR- negative  Elicia Lamp, PharmD, BCPS Clinical Pharmacist 07/26/2016 10:49 AM

## 2016-07-26 NOTE — Progress Notes (Signed)
[facility] PROGRESS NOTE  Calvin Mckee IBB:[id number] DOB: Apr 26, 1950 DOA: 07/20/2016   PCP: Pcp Not In System     Assessment/Plan: Active Problems:   Osteomyelitis ([place])   Gangrene of right foot ([place])   SIAD (syndrome of inappropriate antidiuresis) ([facility])   Benign essential HTN   Right foot infection   65 y.o.malewith medical history significant of Recent Right 2nd toe Amputation (been on Abx with Keflex, Clindamycin, and Doxycycline), Tobacco Abuse, EtOH use, Polysubstance Abuse including recent Tobacco Abuse ,Patient recently had a 2nd Right Toe Amputation on March 6 at [facility] in [place] and recently saw Dr. Erlinda Hong at [facility] 07/06/16. After that he was admitted between 3/31>4/7 for right foot second toe ulceration and cellulitis. Treated with IV antibiotics including IV vancomycin and cefepime for 8 days. [facility] was consulted and recommended to continue antibiotic and outpatient follow-up. Patient admitted back on 4/10, for worsening appearance of the right foot. He states that his physician at [facility] sent him back to the hospital for further evaluation  Assessment and plan Recurrent cellulitis of the right foot Continue IV vancomycin and IV cefepime, day #6 Appreciate dr Sharol Given recommendations-vascular surgery consulted due to abnormal ankle-brachial indices of 0.6 on the right . Patient is status post aortogram Abdominal aortogram with right lower extremity runoff right superficial femoral popliteal stent  Dr. Donzetta Matters will continue to evaluate the patient to assess for wound healing of the right foot. If the foot does not improve the patient will have a below-knee amputation  Vs transmetatarsal amputation Dr. Donzetta Matters will re-assess -Plavix noted, started 4/14 after stent placement  Thrombocytosis - likely acute phase reactant type of response  Chronic hyponatremia- SIADH - cont sodium chloride tablets , fluid restriction, sodium has been  ranging from 130 to 132   Hypertension continue Norvasc, hydralazine prn   EtOH Use Concern for Withdrawal of Alcohol and Opiates -Patient states he drinks at least 2 beers Daily -CIWA Protocol with Lorazepam, started on klonopin and patient has had minimal signs of alcohol withdrawal this admission  Tobacco Abuse Continue nicotine patch -Smoking Cessation Counseling Given  Hx of Polysubstance Abuse Has Hx of Cocaine Abuse/alcohol abuse .   Pain management Discontinued Percocet, started oxycodone 10 mg every 4 as needed , due to uncontrolled pain   DVT prophylaxsis Lovenox  Code Status:  Full code    Family Communication: Discussed in detail with the patient, all imaging results, lab results explained to the patient   Disposition Plan:  Final vascular  recommendations pending regarding amputation      Consultants:  Orthopedics-Dr Sharol Given  Vascular surgery  Procedures:  None  Antibiotics: Anti-infectives    Start     Dose/Rate Route Frequency Ordered Stop   07/23/16 1600  vancomycin (VANCOCIN) 1,500 mg in sodium chloride 0.9 % 500 mL IVPB     1,500 mg 250 mL/hr over 120 Minutes Intravenous Every 12 hours 07/23/16 1525     07/23/16 1600  ceFEPIme (MAXIPIME) 1 g in dextrose 5 % 50 mL IVPB     1 g 100 mL/hr over 30 Minutes Intravenous Every 8 hours 07/23/16 1525     07/20/16 2200  ceFEPIme (MAXIPIME) 1 g in dextrose 5 % 50 mL IVPB  Status:  Discontinued     1 g 100 mL/hr over 30 Minutes Intravenous Every 8 hours 07/20/16 1444 07/23/16 1525   07/20/16 2000  vancomycin (VANCOCIN) 1,500 mg in sodium chloride 0.9 % 500 mL  IVPB  Status:  Discontinued     1,500 mg 250 mL/hr over 120 Minutes Intravenous Every 12 hours 07/20/16 1451 07/23/16 1525   07/20/16 1345  ceFEPIme (MAXIPIME) 2 g in dextrose 5 % 50 mL IVPB     2 g 100 mL/hr over 30 Minutes Intravenous  Once 07/20/16 1331 07/20/16 1418   07/20/16 1345  vancomycin (VANCOCIN) IVPB 1000 mg/200 mL premix     1,000  mg 200 mL/hr over 60 Minutes Intravenous  Once 07/20/16 1331 07/20/16 1559         HPI/Subjectiv Very anxious, still has some pain in his right foot    Objective: Vitals:   07/25/16 0909 07/25/16 1329 07/25/16 2028 07/26/16 0412  BP: (!) 150/68 (!) 147/74 135/67 (!) 154/79  Pulse: 95 97 96 96  Resp:   18 20  Temp:  99.7 F (37.6 C) 99.5 F (37.5 C) 98.2 F (36.8 C)  TempSrc:  Oral Oral Axillary  SpO2: 99% 97% 99% 98%  Weight:      Height:        Intake/Output Summary (Last 24 hours) at 07/26/16 4098 Last data filed at 07/26/16 0526  Gross per 24 hour  Intake              600 ml  Output             2475 ml  Net            -1875 ml    Exam:  Examination:  General exam: Appears calm and comfortable  Respiratory system: Clear to auscultation. Respiratory effort normal. Cardiovascular system: S1 & S2 heard, RRR. No JVD, murmurs, rubs, gallops or clicks. No pedal edema. Gastrointestinal system: Abdomen is nondistended, soft and nontender. No organomegaly or masses felt. Normal bowel sounds heard. Central nervous system:   No focal neurological deficits. Extremities: right foot covered in dressing  Skin: No rashes, lesions or ulcers Psychiatry: agitated      Data Reviewed: I have personally reviewed following labs and imaging studies  Micro Results Recent Results (from the past 240 hour(s))  MRSA PCR Screening     Status: None   Collection Time: 07/20/16  6:01 PM  Result Value Ref Range Status   MRSA by PCR NEGATIVE NEGATIVE Final    Comment:        The GeneXpert MRSA Assay (FDA approved for NASAL specimens only), is one component of a comprehensive MRSA colonization surveillance program. It is not intended to diagnose MRSA infection nor to guide or monitor treatment for MRSA infections.   Surgical pcr screen     Status: None   Collection Time: 07/23/16  3:58 AM  Result Value Ref Range Status   MRSA, PCR NEGATIVE NEGATIVE Final   Staphylococcus  aureus NEGATIVE NEGATIVE Final    Comment:        The Xpert SA Assay (FDA approved for NASAL specimens in patients over 54 years of age), is one component of a comprehensive surveillance program.  Test performance has been validated by [facility] for patients greater than or equal to 83 year old. It is not intended to diagnose infection nor to guide or monitor treatment.     Radiology Reports Mr Foot Right Wo Contrast  Result Date: 07/15/2016 CLINICAL DATA:  Right foot pain worsening since Saturday. Leg started swelling and patient has been on unable to walk nor put any pressure on it. EXAM: MRI OF THE RIGHT FOREFOOT WITHOUT CONTRAST TECHNIQUE: Multiplanar, multisequence MR imaging  of the right forefoot was performed. No intravenous contrast was administered. COMPARISON:  Radiographs of the right foot from 07/15/2016 FINDINGS: Despite patient having received the maximum amount of medication, the study is markedly limited due to motion artifacts. Bones/Joint/Cartilage The patient is status post amputation at the base of the right proximal phalanx with suggestion of T1 hypointensity in T2 fat sat hyperintensity at site of amputation. Findings are suspicious for osteomyelitis. Ligaments Suboptimally assessed Muscles and Tendons Suboptimally assessed Soft tissues Soft tissue edema about the forefoot special along the plantar aspect of the first and second digits. IMPRESSION: Markedly compromised study due to patient motion artifacts. Marrow signal abnormalities at the amputation site of the base of the second proximal phalanx with surrounding soft tissue inflammation/edema raise concern for the presence of osteomyelitis at the stump. Electronically Signed   By: Ashley Royalty M.D.   On: 07/15/2016 20:08   Dg Chest Port 1 View  Result Date: 07/14/2016 CLINICAL DATA:  Fever with right foot infection EXAM: PORTABLE CHEST 1 VIEW COMPARISON:  02/11/2010 FINDINGS: Surgical wire at the lower cervical spine.  Minimal atelectasis right base. No consolidation or effusion. Heart size upper normal. Minimal atherosclerosis. No pneumothorax. IMPRESSION: Minimal atelectasis at the right base. No acute infiltrate or edema. Electronically Signed   By: Donavan Foil M.D.   On: 07/14/2016 18:26   Ap / Lateral X-ray Right Foot  Result Date: 07/10/2016 CLINICAL DATA:  RIGHT foot pain, recent amputation of second toe in February, pain and that this toe EXAM: RIGHT FOOT - 2 VIEW COMPARISON:  05/12/2016 FINDINGS: Interval amputation of second toe through the proximal phalanx. Poorly defined osseous stump at the proximal phalanx, may related to resorption secondary to amputation though infection is not excluded with this appearance. Mild soft tissue swelling at the stump in more diffusely at the dorsum of the entire RIGHT foot. Joint space narrowing at first MTP joint. Remaining joint spaces preserved. No acute fracture, dislocation or additional bone destruction. IMPRESSION: Prior amputation of RIGHT second toe through the proximal phalanx with poor definition of the osseous stump, could represent resorption secondary to resection but osteomyelitis is not excluded. If there is persistent clinical concern for osteomyelitis or soft tissue abscess in the RIGHT foot, consider MR. Electronically Signed   By: Lavonia Dana M.D.   On: 07/10/2016 16:19     CBC  Recent Labs Lab 07/20/16 1201 07/20/16 1335 07/23/16 0540 07/24/16 0325  WBC 8.1 8.0 7.1 9.6  HGB 11.2* 10.4* 10.4* 9.8*  HCT 31.5* 30.3* 30.5* 28.5*  PLT 643* 669* 644* 622*  MCV 84.9 84.6 87.4 86.9  MCH 30.2 29.1 29.8 29.9  MCHC 35.6 34.3 34.1 34.4  RDW 12.9 13.0 13.4 13.1  LYMPHSABS 2.0  --   --   --   MONOABS 0.8  --   --   --   EOSABS 0.2  --   --   --   BASOSABS 0.1  --   --   --     Chemistries   Recent Labs Lab 07/20/16 1201 07/20/16 1335 07/23/16 0540 07/24/16 0325  NA 133*  --  132* 130*  K 4.0  --  4.0 4.0  CL 98*  --  97* 95*  CO2 25  --   26 24  GLUCOSE 89  --  108* 109*  BUN 8  --  <5* 6  CREATININE 0.72 0.64 0.62 0.67  CALCIUM 9.2  --  9.2 9.3   ------------------------------------------------------------------------------------------------------------------ estimated creatinine clearance  is 94.5 mL/min (by C-G formula based on SCr of 0.67 mg/dL). ------------------------------------------------------------------------------------------------------------------ No results for input(s): HGBA1C in the last 72 hours. ------------------------------------------------------------------------------------------------------------------ No results for input(s): CHOL, HDL, LDLCALC, TRIG, CHOLHDL, LDLDIRECT in the last 72 hours. ------------------------------------------------------------------------------------------------------------------ No results for input(s): TSH, T4TOTAL, T3FREE, THYROIDAB in the last 72 hours.  Invalid input(s): FREET3 ------------------------------------------------------------------------------------------------------------------ No results for input(s): VITAMINB12, FOLATE, FERRITIN, TIBC, IRON, RETICCTPCT in the last 72 hours.  Coagulation profile  Recent Labs Lab 07/23/16 0540  INR 1.14    No results for input(s): DDIMER in the last 72 hours.  Cardiac Enzymes No results for input(s): CKMB, TROPONINI, MYOGLOBIN in the last 168 hours.  Invalid input(s): CK ------------------------------------------------------------------------------------------------------------------ Invalid input(s): POCBNP   CBG: No results for input(s): GLUCAP in the last 168 hours.     Studies: No results found.    Lab Results  Component Value Date   HGBA1C 6.2 (H) 07/11/2016   Lab Results  Component Value Date   CREATININE 0.67 07/24/2016       Scheduled Meds: . amLODipine  10 mg Oral Daily  . aspirin EC  81 mg Oral Daily  . ceFEPime (MAXIPIME) IV  1 g Intravenous Q8H  . clopidogrel  75 mg Oral  Daily  . docusate sodium  100 mg Oral Daily  . enoxaparin (LOVENOX) injection  40 mg Subcutaneous Q24H  . nicotine  21 mg Transdermal Daily  . senna-docusate  2 tablet Oral QHS  . sodium chloride flush  3 mL Intravenous Q12H  . sodium chloride  1 g Oral BID WC  . vancomycin  1,500 mg Intravenous Q12H   Continuous Infusions: . sodium chloride 100 mL/hr at 07/23/16 0612  . sodium chloride 75 mL/hr at 07/26/16 0029     LOS: 6 days    Time spent: >30 MINS    Reyne Dumas  [facility] Pager [phone]. If 7PM-7AM, please contact night-coverage at [email or web address], password [facility] 07/26/2016, 8:08 AM  LOS: 6 days

## 2016-07-26 NOTE — Progress Notes (Signed)
   Met w/ patient '@bedside'$ .  Pt. blames self for injuries.  Struggles w/ pain & grief management.  Unpacked grief.  Will follow, as needed.  - Rev. [place] MDiv ThM

## 2016-07-26 NOTE — Care Management Important Message (Signed)
Important Message  Patient Details  Name: Calvin Mckee MRN: [id number] Date of Birth: [birth year]   Medicare Important Message Given:  Yes    Nathen May 07/26/2016, 2:31 PM

## 2016-07-26 NOTE — Progress Notes (Addendum)
[facility]  Subjective  - Patient sitting with feet on the floor, still having rest pain with elevation.   Objective (!) 154/79 96 98.2 F (36.8 C) (Axillary) 20 98%  Intake/Output Summary (Last 24 hours) at 07/26/16 0710 Last data filed at 07/26/16 0526  Gross per 24 hour  Intake              900 ml  Output             2775 ml  Net            -1875 ml   Extremities:  Gangrenous changes to right dorsum and plantar aspect of foot.  Malodorous.  Without change.   Assessment/Planning:  66 y.o. male is s/p:  Abdominal aortogram with right lower extremity runoff right superficial femoral popliteal stent (6 x 100 mm Abbott 2)  3 Days Post-Op   Will discuss surgical plan with Dr. Donzetta Matters and he can discuss plan of care with Dr. Lynden Oxford, [facility] 07/26/2016 7:10 AM --  Laboratory Lab Results:  Recent Labs  07/24/16 0325  WBC 9.6  HGB 9.8*  HCT 28.5*  PLT 622*   BMET  Recent Labs  07/24/16 0325  NA 130*  K 4.0  CL 95*  CO2 24  GLUCOSE 109*  BUN 6  CREATININE 0.67  CALCIUM 9.3    COAG Lab Results  Component Value Date   INR 1.14 07/23/2016   INR 1.0 02/06/2007   No results found for: PTT   I have independently interviewed patient and agree with PA assessment and plan above. I have discussed with him the possibility of transmetatarsal amputation which would almost certainly require partial open with wound VAC delayed healing. The other possibility would be below-knee amputation which would get him on the road to recovery sooner. He was very emotional during our procedure and will have to discuss with him further later today. Will plan OR for intervention tomorrow with either tma or bka.   Brandon C. Donzetta Matters, MD [facility] Office: [phone] Pager: [phone]

## 2016-07-27 ENCOUNTER — Telehealth: Payer: Self-pay | Admitting: Vascular Surgery

## 2016-07-27 ENCOUNTER — Encounter (HOSPITAL_COMMUNITY)
Admission: EM | Disposition: A | Discharge status: Skilled Nursing Facility | Payer: Self-pay | Source: Home / Self Care | Attending: Internal Medicine

## 2016-07-27 ENCOUNTER — Inpatient Hospital Stay (HOSPITAL_COMMUNITY): Payer: Medicare Other | Admitting: Anesthesiology

## 2016-07-27 ENCOUNTER — Encounter (HOSPITAL_COMMUNITY): Payer: Self-pay | Admitting: Surgery

## 2016-07-27 DIAGNOSIS — I70261 Atherosclerosis of native arteries of extremities with gangrene, right leg: Secondary | ICD-10-CM

## 2016-07-27 HISTORY — PX: AMPUTATION: SHX166

## 2016-07-27 LAB — GLUCOSE, CAPILLARY
GLUCOSE-CAPILLARY: 160 mg/dL — AB (ref 65–99)
Glucose-Capillary: 149 mg/dL — ABNORMAL HIGH (ref 65–99)

## 2016-07-27 LAB — CREATININE, SERUM
CREATININE: 0.68 mg/dL (ref 0.61–1.24)
GFR calc Af Amer: >60 mL/min (ref >60)
GFR calc non Af Amer: >60 mL/min (ref >60)

## 2016-07-27 SURGERY — AMPUTATION BELOW KNEE
Anesthesia: General | Site: Leg Lower | Laterality: Right

## 2016-07-27 MED ORDER — PROPOFOL 10 MG/ML IV BOLUS
INTRAVENOUS | Status: AC
  Filled 2016-07-27: qty 20

## 2016-07-27 MED ORDER — 0.9 % SODIUM CHLORIDE (POUR BTL) OPTIME
TOPICAL | Status: DC | PRN
  Administered 2016-07-27: 1000 mL

## 2016-07-27 MED ORDER — PHENYLEPHRINE HCL 10 MG/ML IJ SOLN
INTRAMUSCULAR | Status: DC | PRN
  Administered 2016-07-27: 80 ug via INTRAVENOUS

## 2016-07-27 MED ORDER — MIDAZOLAM HCL 2 MG/2ML IJ SOLN: 0.5000 mg | Freq: Once | INTRAMUSCULAR | Status: DC | PRN

## 2016-07-27 MED ORDER — HYDROMORPHONE HCL 1 MG/ML IJ SOLN
INTRAMUSCULAR | Status: DC | PRN
  Administered 2016-07-27 (×2): 0.5 mg via INTRAVENOUS

## 2016-07-27 MED ORDER — ENOXAPARIN SODIUM 40 MG/0.4ML ~~LOC~~ SOLN
40.0000 mg | SUBCUTANEOUS | Status: DC
  Administered 2016-07-29: 40 mg via SUBCUTANEOUS
  Filled 2016-07-27: qty 0.4

## 2016-07-27 MED ORDER — LACTATED RINGERS IV SOLN
INTRAVENOUS | Status: DC | PRN
  Administered 2016-07-27 (×2): via INTRAVENOUS

## 2016-07-27 MED ORDER — PROPOFOL 10 MG/ML IV BOLUS
INTRAVENOUS | Status: DC | PRN
  Administered 2016-07-27: 150 mg via INTRAVENOUS

## 2016-07-27 MED ORDER — LIDOCAINE HCL (CARDIAC) 20 MG/ML IV SOLN
INTRAVENOUS | Status: DC | PRN
  Administered 2016-07-27: 40 mg via INTRAVENOUS
  Administered 2016-07-27: 60 mg via INTRAVENOUS

## 2016-07-27 MED ORDER — HYDROMORPHONE HCL 1 MG/ML IJ SOLN
INTRAMUSCULAR | Status: AC
  Filled 2016-07-27: qty 1

## 2016-07-27 MED ORDER — PHENOL 1.4 % MT LIQD: 1.0000 | OROMUCOSAL | Status: DC | PRN

## 2016-07-27 MED ORDER — FENTANYL CITRATE (PF) 100 MCG/2ML IJ SOLN
INTRAMUSCULAR | Status: DC | PRN
  Administered 2016-07-27: 100 ug via INTRAVENOUS
  Administered 2016-07-27 (×2): 50 ug via INTRAVENOUS
  Administered 2016-07-27: 100 ug via INTRAVENOUS
  Administered 2016-07-27: 150 ug via INTRAVENOUS
  Administered 2016-07-27: 50 ug via INTRAVENOUS

## 2016-07-27 MED ORDER — MIDAZOLAM HCL 2 MG/2ML IJ SOLN
INTRAMUSCULAR | Status: AC
  Filled 2016-07-27: qty 2

## 2016-07-27 MED ORDER — GUAIFENESIN-DM 100-10 MG/5ML PO SYRP: 15.0000 mL | ORAL_SOLUTION | ORAL | Status: DC | PRN

## 2016-07-27 MED ORDER — LIDOCAINE 2% (20 MG/ML) 5 ML SYRINGE
INTRAMUSCULAR | Status: AC
  Filled 2016-07-27: qty 5

## 2016-07-27 MED ORDER — ONDANSETRON HCL 4 MG/2ML IJ SOLN
INTRAMUSCULAR | Status: DC | PRN
  Administered 2016-07-27: 4 mg via INTRAVENOUS

## 2016-07-27 MED ORDER — ONDANSETRON HCL 4 MG/2ML IJ SOLN
INTRAMUSCULAR | Status: AC
  Filled 2016-07-27: qty 2

## 2016-07-27 MED ORDER — MEPERIDINE HCL 25 MG/ML IJ SOLN: 6.2500 mg | INTRAMUSCULAR | Status: DC | PRN

## 2016-07-27 MED ORDER — EPHEDRINE SULFATE 50 MG/ML IJ SOLN
INTRAMUSCULAR | Status: DC | PRN
  Administered 2016-07-27: 5 mg via INTRAVENOUS

## 2016-07-27 MED ORDER — FENTANYL CITRATE (PF) 250 MCG/5ML IJ SOLN
INTRAMUSCULAR | Status: AC
  Filled 2016-07-27: qty 5

## 2016-07-27 MED ORDER — PHENYLEPHRINE 40 MCG/ML (10ML) SYRINGE FOR IV PUSH (FOR BLOOD PRESSURE SUPPORT)
PREFILLED_SYRINGE | INTRAVENOUS | Status: AC
  Filled 2016-07-27: qty 10

## 2016-07-27 MED ORDER — LACTATED RINGERS IV SOLN
INTRAVENOUS | Status: DC
  Administered 2016-07-27: 09:00:00 via INTRAVENOUS

## 2016-07-27 MED ORDER — EPHEDRINE 5 MG/ML INJ
INTRAVENOUS | Status: AC
Start: 2016-07-27 — End: 2016-07-27
  Filled 2016-07-27: qty 10

## 2016-07-27 MED ORDER — PROMETHAZINE HCL 25 MG/ML IJ SOLN: 6.2500 mg | INTRAMUSCULAR | Status: DC | PRN

## 2016-07-27 MED ORDER — MIDAZOLAM HCL 5 MG/5ML IJ SOLN
INTRAMUSCULAR | Status: DC | PRN
  Administered 2016-07-27: 2 mg via INTRAVENOUS

## 2016-07-27 MED ORDER — HYDROMORPHONE HCL 1 MG/ML IJ SOLN
0.2500 mg | INTRAMUSCULAR | Status: DC | PRN
  Administered 2016-07-27 (×2): 0.5 mg via INTRAVENOUS

## 2016-07-27 MED ORDER — PANTOPRAZOLE SODIUM 40 MG PO TBEC
40.0000 mg | DELAYED_RELEASE_TABLET | Freq: Every day | ORAL | Status: DC
  Administered 2016-07-27 – 2016-07-29 (×3): 40 mg via ORAL
  Filled 2016-07-27 (×3): qty 1

## 2016-07-27 SURGICAL SUPPLY — 51 items
BANDAGE ACE 4X5 VEL STRL LF (GAUZE/BANDAGES/DRESSINGS) ×4 IMPLANT
BANDAGE ACE 6X5 VEL STRL LF (GAUZE/BANDAGES/DRESSINGS) IMPLANT
BLADE AVERAGE 25MMX9MM (BLADE) ×1
BLADE AVERAGE 25X9 (BLADE) ×3 IMPLANT
BLADE SAW SGTL 81X20 HD (BLADE) IMPLANT
BNDG COHESIVE 6X5 TAN STRL LF (GAUZE/BANDAGES/DRESSINGS) ×4 IMPLANT
BNDG CONFORM 3 STRL LF (GAUZE/BANDAGES/DRESSINGS) IMPLANT
BNDG GAUZE ELAST 4 BULKY (GAUZE/BANDAGES/DRESSINGS) ×4 IMPLANT
CANISTER SUCT 3000ML PPV (MISCELLANEOUS) ×8 IMPLANT
CLIP TI MEDIUM 6 (CLIP) ×4 IMPLANT
COVER SURGICAL LIGHT HANDLE (MISCELLANEOUS) ×8 IMPLANT
DRAIN CHANNEL 19F RND (DRAIN) IMPLANT
DRAPE EXTREMITY TIBURON (DRAPES) ×4 IMPLANT
DRAPE HALF SHEET 40X57 (DRAPES) ×8 IMPLANT
DRAPE ORTHO SPLIT 77X108 STRL (DRAPES)
DRAPE SURG ORHT 6 SPLT 77X108 (DRAPES) IMPLANT
DRSG ADAPTIC 3X8 NADH LF (GAUZE/BANDAGES/DRESSINGS) ×4 IMPLANT
DRSG PAD ABDOMINAL 8X10 ST (GAUZE/BANDAGES/DRESSINGS) ×4 IMPLANT
ELECT REM PT RETURN 9FT ADLT (ELECTROSURGICAL) ×8
ELECTRODE REM PT RTRN 9FT ADLT (ELECTROSURGICAL) ×4 IMPLANT
EVACUATOR SILICONE 100CC (DRAIN) IMPLANT
GAUZE SPONGE 4X4 12PLY STRL (GAUZE/BANDAGES/DRESSINGS) ×4 IMPLANT
GLOVE BIO SURGEON STRL SZ7.5 (GLOVE) ×8 IMPLANT
GOWN STRL REUS W/ TWL LRG LVL3 (GOWN DISPOSABLE) ×8 IMPLANT
GOWN STRL REUS W/ TWL XL LVL3 (GOWN DISPOSABLE) ×4 IMPLANT
GOWN STRL REUS W/TWL LRG LVL3 (GOWN DISPOSABLE) ×8
GOWN STRL REUS W/TWL XL LVL3 (GOWN DISPOSABLE) ×4
KIT BASIN OR (CUSTOM PROCEDURE TRAY) ×8 IMPLANT
KIT ROOM TURNOVER OR (KITS) ×8 IMPLANT
NEEDLE HYPO 25GX1X1/2 BEV (NEEDLE) IMPLANT
NS IRRIG 1000ML POUR BTL (IV SOLUTION) ×8 IMPLANT
PACK GENERAL/GYN (CUSTOM PROCEDURE TRAY) ×8 IMPLANT
PAD ARMBOARD 7.5X6 YLW CONV (MISCELLANEOUS) ×16 IMPLANT
SAW GIGLI STERILE 20 (MISCELLANEOUS) ×4 IMPLANT
SPECIMEN JAR SMALL (MISCELLANEOUS) ×4 IMPLANT
STAPLER VISISTAT 35W (STAPLE) ×4 IMPLANT
STOCKINETTE IMPERVIOUS LG (DRAPES) ×4 IMPLANT
SUT ETHILON 3 0 PS 1 (SUTURE) ×4 IMPLANT
SUT SILK 0 TIES 10X30 (SUTURE) ×4 IMPLANT
SUT SILK 2 0 (SUTURE) ×4
SUT SILK 2-0 18XBRD TIE 12 (SUTURE) ×4 IMPLANT
SUT SILK 3 0 (SUTURE)
SUT SILK 3 0 SH CR/8 (SUTURE) ×4 IMPLANT
SUT SILK 3-0 18XBRD TIE 12 (SUTURE) IMPLANT
SUT VIC AB 2-0 CT1 18 (SUTURE) ×20 IMPLANT
SWAB CULTURE ESWAB REG 1ML (MISCELLANEOUS) IMPLANT
SYR CONTROL 10ML LL (SYRINGE) IMPLANT
TOWEL OR 17X24 6PK STRL BLUE (TOWEL DISPOSABLE) ×8 IMPLANT
TOWEL OR 17X26 10 PK STRL BLUE (TOWEL DISPOSABLE) ×8 IMPLANT
UNDERPAD 30X30 (UNDERPADS AND DIAPERS) ×8 IMPLANT
WATER STERILE IRR 1000ML POUR (IV SOLUTION) ×8 IMPLANT

## 2016-07-27 NOTE — Anesthesia Procedure Notes (Signed)
Procedure Name: LMA Insertion Date/Time: 07/27/2016 9:49 AM Performed by: Jacquiline Doe A Pre-anesthesia Checklist: Patient identified, Emergency Drugs available, Suction available and Patient being monitored Patient Re-evaluated:Patient Re-evaluated prior to inductionOxygen Delivery Method: Circle System Utilized and Circle system utilized Preoxygenation: Pre-oxygenation with 100% oxygen Intubation Type: IV induction Ventilation: Mask ventilation without difficulty LMA: LMA inserted LMA Size: 4.0 Tube type: Oral Number of attempts: 1 Placement Confirmation: positive ETCO2 and breath sounds checked- equal and bilateral Tube secured with: Tape Dental Injury: Teeth and Oropharynx as per pre-operative assessment

## 2016-07-27 NOTE — Progress Notes (Signed)
Went to give Pt. meds due to c/o of pain; knocked on door and found Pt. Naked and masturbating.

## 2016-07-27 NOTE — Telephone Encounter (Signed)
-----   Message from Mena Goes, RN sent at 07/27/2016 12:48 PM EDT ----- Regarding: 4 weeks BKA postop   ----- Message ----- From: Gabriel Earing, PA-C Sent: 07/27/2016  10:53 AM To: Vvs Charge Pool  s/p right BKA 07/27/16.  f/u with Dr. Donzetta Matters in 4 weeks.   Thanks, Aldona Bar

## 2016-07-27 NOTE — Care Management Note (Addendum)
Case Management Note Marvetta Gibbons RN, BSN Unit 2W-Case Manager [phone]  Patient Details  Name: AUTUMN GUNN MRN: [id number] Date of Birth: October 22, 1950  Subjective/Objective:   Pt admitted with non healing right surgical wound-  Critical leg ischemia- plan for OR on 4/17 for either BKA or TMA               Action/Plan: PTA pt was at SNF- [facility]- PT/OT evals ordered post op 4/17- pt will most likely need to return to SNF- CSW following  Expected Discharge Date:                  Expected Discharge Plan:  [place]  In-House Referral:  Clinical Social Work  Discharge planning Services  CM Consult  Post Acute Care Choice:    Choice offered to:     DME Arranged:    DME Agency:     HH Arranged:    [place] Agency:     Status of Service:  In process, will continue to follow  If discussed at Long Length of Stay Meetings, dates discussed:    Discharge Disposition:   Additional Comments:  07/27/16- 48- Marvetta Gibbons RN, CM- pt s/p BKA today- CIR consulted post amputation- CSW also following - as pt was from SNF- prior to admission.   Dawayne Patricia, RN 07/27/2016, 2:32 PM

## 2016-07-27 NOTE — Transfer of Care (Signed)
Immediate Anesthesia Transfer of Care Note  Patient: Calvin Mckee  Procedure(s) Performed: Procedure(s): RIGHT BELOW KNEE AMPUTATION (Right)  Patient Location: PACU  Anesthesia Type:General  Level of Consciousness: awake, oriented, sedated, patient cooperative and responds to stimulation  Airway & Oxygen Therapy: Patient Spontanous Breathing and Patient connected to nasal cannula oxygen  Post-op Assessment: Report given to RN, Post -op Vital signs reviewed and stable, Patient moving all extremities and Patient moving all extremities X 4  Post vital signs: Reviewed and stable  Last Vitals:  Vitals:   07/26/16 2005 07/27/16 0249  BP: (!) 149/64 (!) 141/72  Pulse: 89 85  Resp: 20 18  Temp: 36.6 C 36.8 C    Last Pain:  Vitals:   07/27/16 0646  TempSrc:   PainSc: 10-Worst pain ever      Patients Stated Pain Goal: 4 (14/78/29 5621)  Complications: No apparent anesthesia complications

## 2016-07-27 NOTE — Anesthesia Preprocedure Evaluation (Addendum)
Anesthesia Evaluation  Patient identified by MRN, date of birth, ID band Patient awake    Reviewed: Allergy & Precautions, NPO status , Patient's Chart, lab work & pertinent test results  History of Anesthesia Complications Negative for: history of anesthetic complications  Airway Mallampati: II  TM Distance: >3 FB Neck ROM: Full    Dental  (+) Dental Advisory Given, Poor Dentition   Pulmonary COPD, Current Smoker,    breath sounds clear to auscultation       Cardiovascular hypertension, Pt. on medications + Peripheral Vascular Disease   Rhythm:Regular Rate:Normal     Neuro/Psych negative neurological ROS     GI/Hepatic negative GI ROS, (+)     substance abuse  alcohol use,   Endo/Other  negative endocrine ROS  Renal/GU negative Renal ROS     Musculoskeletal  (+) Arthritis ,   Abdominal   Peds  Hematology Hb 9.8   Anesthesia Other Findings   Reproductive/Obstetrics                            Anesthesia Physical Anesthesia Plan  ASA: III  Anesthesia Plan: General   Post-op Pain Management:    Induction: Intravenous  Airway Management Planned: LMA  Additional Equipment:   Intra-op Plan:   Post-operative Plan:   Informed Consent: I have reviewed the patients History and Physical, chart, labs and discussed the procedure including the risks, benefits and alternatives for the proposed anesthesia with the patient or authorized representative who has indicated his/her understanding and acceptance.   Dental advisory given  Plan Discussed with: CRNA and Surgeon  Anesthesia Plan Comments: (Plan routine monitors, GA-LMA OK)        Anesthesia Quick Evaluation

## 2016-07-27 NOTE — Telephone Encounter (Signed)
Sched appt 08/27/16 at 12:00. Lm on hm# for pt to confirm appt.

## 2016-07-27 NOTE — Consult Note (Signed)
Physical Medicine and Rehabilitation Consult   Calvin for Consult: R-BKA due to gangrenous changes Referring Physician: Dr. Donzetta Matters.    HPI: Calvin Mckee is a 66 y.o. male with history of HTN, tobacco abuse, polysubstance abuse, right great toe amputation 06/15/16 in [place], recent admission for cellulitis with suspected osteomyelitis right foot.  He was discharged to SNF on 4/7 with oral antibiotics for treatment but continued to have gangrenous changes and was readmitted on 4/10 due to pain and problems with wound healing. He underwent angiography with stenting of R-SFV but failed attempts at limb salvage requiring. He was agreeable to undergo R-BKA on 07/27/16 by Dr. Donzetta Matters. Post op with drop in H/H to 6.8/20.3 and to be transfused today. PT/OT evaluations pending. CIR consulted in anticipation of extensive rehab needs.   He lives alone and plans on going to [facility] SNF post d/c   Review of Systems  Constitutional: Negative for chills and fever.  HENT: Negative for hearing loss and tinnitus.   Eyes: Negative for blurred vision and double vision.  Respiratory: Negative for cough, shortness of breath and stridor.   Cardiovascular: Negative for chest pain, palpitations and leg swelling.  Gastrointestinal: Positive for constipation. Negative for abdominal pain, heartburn and nausea.  Genitourinary: Negative for dysuria and urgency.  Musculoskeletal: Negative for back pain and myalgias.  Neurological: Negative for sensory change and speech change.  Psychiatric/Behavioral: The patient does not have insomnia.       Past Medical History:  Diagnosis Date  . Arthritis   . Hypertension     Past Surgical History:  Procedure Laterality Date  . ABDOMINAL AORTOGRAM N/A 07/23/2016   Procedure: Abdominal Aortogram;  Surgeon: Elam Dutch, MD;  Location: [place] [facility];  Service: Cardiovascular;  Laterality: N/A;  . AMPUTATION Right 07/27/2016   Procedure: RIGHT BELOW KNEE  AMPUTATION;  Surgeon: Waynetta Sandy, MD;  Location: [facility];  Service: Vascular;  Laterality: Right;  . CERVICAL FUSION    . LOWER EXTREMITY ANGIOGRAPHY Right 07/23/2016   Procedure: Lower Extremity Angiography;  Surgeon: Elam Dutch, MD;  Location: [place] [facility];  Service: Cardiovascular;  Laterality: Right;  . PERIPHERAL VASCULAR INTERVENTION Right 07/23/2016   Procedure: Peripheral Vascular Intervention;  Surgeon: Elam Dutch, MD;  Location: [place] [facility];  Service: Cardiovascular;  Laterality: Right;  SFA     History reviewed. No pertinent family history.    Social History:  Lives alone. Has been unemployed for past 5 years. Used to work in [employer]. He  reports that he quit smoking 3 weeks ago. He was smoking about 2 PPD. He has never used smokeless tobacco. He denies alcohol use. Per reports that he uses drugs, including "Crack" cocaine and Other-see comments.    Allergies  Allergen Reactions  . No Known Allergies     Medications Prior to Admission  Medication Sig Dispense Refill  . amLODipine (NORVASC) 10 MG tablet Take 1 tablet (10 mg total) by mouth daily. 30 tablet 0  . cholecalciferol (VITAMIN D) 1000 units tablet Take 1,000 Units by mouth 2 (two) times daily.    . ciprofloxacin (CIPRO) 500 MG tablet Take 1 tablet (500 mg total) by mouth 2 (two) times daily. 40 tablet 0  . clonazePAM (KLONOPIN) 0.5 MG tablet Take 0.5 tablets (0.25 mg total) by mouth 3 (three) times daily as needed (anxiety). (Patient taking differently: Take 0.25 mg by mouth every 8 (eight) hours as needed for anxiety. ) 10  tablet 0  . doxycycline (VIBRAMYCIN) 100 MG capsule Take 1 capsule (100 mg total) by mouth 2 (two) times daily. (Patient taking differently: Take 100 mg by mouth 2 (two) times daily. Started 04/07 for 20 days) 40 capsule 0  . ibuprofen (ADVIL,MOTRIN) 800 MG tablet Take 1 tablet (800 mg total) by mouth every 8 (eight) hours as needed. (Patient taking differently:  Take 800 mg by mouth every 8 (eight) hours as needed (for pain). ) 30 tablet 0  . magnesium oxide (MAG-OX) 400 MG tablet Take 1 tablet (400 mg total) by mouth daily. 30 tablet 0  . Multiple Vitamin (MULTIVITAMIN WITH MINERALS) TABS tablet Take 1 tablet by mouth daily.    . nicotine (NICODERM CQ - DOSED IN MG/24 HOURS) 21 mg/24hr patch Place 21 mg onto the skin daily.    Marland Kitchen oxyCODONE-acetaminophen (PERCOCET/ROXICET) 5-325 MG tablet Take 1 tablet by mouth every 8 (eight) hours as needed for moderate pain. 5 tablet 0  . polyethylene glycol (MIRALAX / GLYCOLAX) packet Take 17 g by mouth 2 (two) times daily. 14 each 0  . saccharomyces boulardii (FLORASTOR) 250 MG capsule Take 1 capsule (250 mg total) by mouth 2 (two) times daily. 60 capsule 0  . senna-docusate (SENOKOT-S) 8.6-50 MG tablet Take 1 tablet by mouth at bedtime. 30 tablet 0  . [EXPIRED] sodium chloride 1 g tablet Take 1 tablet (1 g total) by mouth 2 (two) times daily with a meal. 10 tablet 0  . thiamine 100 MG tablet Take 1 tablet (100 mg total) by mouth daily. 30 tablet 0    Home: Home Living Family/patient expects to be discharged to:: Skilled nursing facility Living Arrangements: Other (Comment)  Functional History:   Functional Status:  Mobility:          ADL:    Cognition: Cognition Orientation Level: Oriented X4    Blood pressure (!) 155/59, pulse 97, temperature 99.7 F (37.6 C), temperature source Oral, resp. rate 18, height 5\' 9"  (1.753 m), weight 74.8 kg (165 lb), SpO2 100 %. Physical Exam  Nursing note and vitals reviewed. Constitutional: He is oriented to person, place, and time. He appears well-developed and well-nourished.  Sitting at EOB with clothes on.   HENT:  Head: Normocephalic and atraumatic.  Eyes: Conjunctivae and EOM are normal. Pupils are equal, round, and reactive to light.  Neck: Normal range of motion. Neck supple.  Cardiovascular: Normal rate and regular rhythm.   Respiratory: Effort  normal and breath sounds normal. No stridor. No respiratory distress.  GI: Soft. Bowel sounds are normal. He exhibits no distension. There is no tenderness.  Musculoskeletal:  L-BKA with dry dressing--tender to touch.   Neurological: He is alert and oriented to person, place, and time.  Skin: Skin is warm.  Psychiatric: He has a normal mood and affect. His behavior is normal. Thought content normal.  Sits independently at edge of bed. Motor strength is 5/5 bilateral deltoid, biceps, triceps, grip, as well as left hip flexor, knee extensor, ankle dorsal flexor. His left foot is warm. He has no breakdown on the foot. He has sensation light touch, left foot  Results for orders placed or performed during the hospital encounter of 07/20/16 (from the past 24 hour(s))  Glucose, capillary     Status: Abnormal   Collection Time: 07/27/16  5:10 PM  Result Value Ref Range   Glucose-Capillary 149 (H) 65 - 99 mg/dL   Comment 1 Notify RN    Comment 2 Document in Chart  Glucose, capillary     Status: Abnormal   Collection Time: 07/27/16  8:39 PM  Result Value Ref Range   Glucose-Capillary 160 (H) 65 - 99 mg/dL   Comment 1 Notify RN    Comment 2 Document in Chart   Comprehensive metabolic panel     Status: Abnormal   Collection Time: 07/28/16  2:21 AM  Result Value Ref Range   Sodium 131 (L) 135 - 145 mmol/L   Potassium 4.1 3.5 - 5.1 mmol/L   Chloride 97 (L) 101 - 111 mmol/L   CO2 28 22 - 32 mmol/L   Glucose, Bld 117 (H) 65 - 99 mg/dL   BUN 7 6 - 20 mg/dL   Creatinine, Ser 0.67 0.61 - 1.24 mg/dL   Calcium 8.6 (L) 8.9 - 10.3 mg/dL   Total Protein 6.6 6.5 - 8.1 g/dL   Albumin 2.5 (L) 3.5 - 5.0 g/dL   AST 34 15 - 41 U/L   ALT 27 17 - 63 U/L   Alkaline Phosphatase 44 38 - 126 U/L   Total Bilirubin 0.6 0.3 - 1.2 mg/dL   GFR calc non Af Amer >60 >60 mL/min   GFR calc Af Amer >60 >60 mL/min   Anion gap 6 5 - 15  CBC     Status: Abnormal   Collection Time: 07/28/16  2:21 AM  Result Value Ref  Range   WBC 9.1 4.0 - 10.5 K/uL   RBC 2.35 (L) 4.22 - 5.81 MIL/uL   Hemoglobin 6.8 (LL) 13.0 - 17.0 g/dL   HCT 20.3 (L) 39.0 - 52.0 %   MCV 86.4 78.0 - 100.0 fL   MCH 28.9 26.0 - 34.0 pg   MCHC 33.5 30.0 - 36.0 g/dL   RDW 12.9 11.5 - 15.5 %   Platelets 518 (H) 150 - 400 K/uL  Type and screen [facility]     Status: None (Preliminary result)   Collection Time: 07/28/16  5:37 AM  Result Value Ref Range   ABO/RH(D) B POS    Antibody Screen NEG    Sample Expiration 07/31/2016    Unit Number [id number]    Blood Component Type RED CELLS,LR    Unit division 00    Status of Unit ALLOCATED    Transfusion Status PENDING    Crossmatch Result PENDING   Prepare RBC     Status: None   Collection Time: 07/28/16  5:37 AM  Result Value Ref Range   Order Confirmation ORDER PROCESSED BY BLOOD BANK    No results found.  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 Does the need for close, 24 hr/day medical supervision in concert with the patient's rehab needs make it unreasonable for this patient to be served in a less intensive setting? Yes 2. Co-Morbidities requiring supervision/potential complications: Severe postoperative anemia, hypertension, history of polysubstance abuse 3. Due to bladder management, bowel management, safety, skin/wound care, disease management, medication administration, pain management and patient education, does the patient require 24 hr/day rehab nursing? Yes 4. Does the patient require coordinated care of a physician, rehab nurse, PT (1-2 hrs/day, 5 days/week) and OT (1-2 hrs/day, 5 days/week) to address physical and functional deficits in the context of the above medical diagnosis(es)? Yes Addressing deficits in the following areas: balance, endurance, locomotion, strength, transferring, bowel/bladder control, bathing, dressing, feeding, grooming and toileting 5. Can the patient actively participate in an intensive  therapy program of at least 3 hrs of therapy per day at least  5 days per week? Yes 6. The potential for patient to make measurable gains while on inpatient rehab is good 7. Anticipated functional outcomes upon discharge from inpatient rehab are supervision and min assist  with PT, supervision and min assist with OT, n/a with SLP. 8. Estimated rehab length of stay to reach the above functional goals is: 7-10d 9. Does the patient have adequate social supports and living environment to accommodate these discharge functional goals? Yes 10. Anticipated D/C setting: SNF 11. Anticipated post D/C treatments: PT, OT at SNF 12. Overall Rehab/Functional Prognosis: good  RECOMMENDATIONS: This patient's condition is appropriate for continued rehabilitative care in the following setting: CIR Patient has agreed to participate in recommended program. Yes Note that insurance prior authorization may be required for reimbursement for recommended care.  Comment: [facility]-term rehabilitation at [facility] address medical concerns, pain management prior to SNF.  Charlett Blake M.D. [facility] FAAPM&R (Sports Med, Neuromuscular Med) Diplomate Am Board of Electrodiagnostic Med  Flora Lipps 07/28/2016

## 2016-07-27 NOTE — Progress Notes (Signed)
  Progress Note    07/27/2016 9:13 AM Day of Surgery  Subjective:  Remains emotional about amputation  Vitals:   07/26/16 2005 07/27/16 0249  BP: (!) 149/64 (!) 141/72  Pulse: 89 85  Resp: 20 18  Temp: 97.8 F (36.6 C) 98.2 F (36.8 C)    Physical Exam: Oriented x 3 Non labored respirations Abdomen is soft Right foot with signifcant eschar on dorsum and wet at 2nd toe amp site  CBC    Component Value Date/Time   WBC 9.6 07/24/2016 0325   RBC 3.28 (L) 07/24/2016 0325   HGB 9.8 (L) 07/24/2016 0325   HCT 28.5 (L) 07/24/2016 0325   PLT 622 (H) 07/24/2016 0325   MCV 86.9 07/24/2016 0325   MCH 29.9 07/24/2016 0325   MCHC 34.4 07/24/2016 0325   RDW 13.1 07/24/2016 0325   LYMPHSABS 2.0 07/20/2016 1201   MONOABS 0.8 07/20/2016 1201   EOSABS 0.2 07/20/2016 1201   BASOSABS 0.1 07/20/2016 1201    BMET    Component Value Date/Time   NA 129 (L) 07/26/2016 1121   K 3.9 07/26/2016 1121   CL 97 (L) 07/26/2016 1121   CO2 22 07/26/2016 1121   GLUCOSE 132 (H) 07/26/2016 1121   BUN 6 07/26/2016 1121   CREATININE 0.68 07/27/2016 0210   CALCIUM 8.6 (L) 07/26/2016 1121   GFRNONAA >60 07/27/2016 0210   GFRAA >60 07/27/2016 0210    INR    Component Value Date/Time   INR 1.14 07/23/2016 0540     Intake/Output Summary (Last 24 hours) at 07/27/16 0913 Last data filed at 07/27/16 4332  Gross per 24 hour  Intake              600 ml  Output             4325 ml  Net            -3725 ml     Assessment:  66 y.o. male is here with mixed wet and dry gangrene. s/p sfa stenting.  Plan: Discussed options of bka vs tma. I believe a tma would have to be partially left open and require him to non ambultory for several weeks and have a marginal chance of healing. I have discussed his case with my partner who also saw him this weekend and I believe the best course of action is bka on the right. He understands this and is willing to proceed.    Cloys Vera C. Donzetta Matters, MD [facility] Office: [phone] Pager: [phone]  07/27/2016 9:13 AM

## 2016-07-27 NOTE — Progress Notes (Addendum)
[facility] PROGRESS NOTE  THORNE WIRZ LPF:[id number] DOB: 04-Jul-1950 DOA: 07/20/2016   PCP: Pcp Not In System     Assessment/Plan: Active Problems:   Osteomyelitis ([place])   Gangrene of right foot ([place])   SIAD (syndrome of inappropriate antidiuresis) (HCC)   Benign essential HTN   Right foot infection   65 y.o.malewith medical history significant of Recent Right 2nd toe Amputation (been on Abx with Keflex, Clindamycin, and Doxycycline), Tobacco Abuse, EtOH use, Polysubstance Abuse including recent Tobacco Abuse ,Patient recently had a 2nd Right Toe Amputation on March 6 at [facility] in [place] and recently saw Dr. Erlinda Hong at [facility] 07/06/16. After that he was admitted between 3/31>4/7 for right foot second toe ulceration and cellulitis. Treated with IV antibiotics including IV vancomycin and cefepime for 8 days. [facility] was consulted and recommended to continue antibiotic and outpatient follow-up. Patient admitted back on 4/10, for worsening appearance of the right foot. He states that his physician at [facility] sent him back to the hospital for further evaluation. Vascular surgery consulted-  now Right below knee amputation  on 4/17.   Assessment and plan Recurrent cellulitis of the right foot Continue IV vancomycin and IV cefepime, on it since 4/10 initially seen by  dr duda who recommended vascular surgery consult,  due to abnormal ankle-brachial indices of 0.6 on the right . s/p angioplasty and stenting of right superficial femoral artery on 4/13 Dr. Donzetta Matters will continue to evaluate the patient to assess for wound healing of the right foot. If the foot does not improve the patient will have a below-knee amputation  Vs transmetatarsal amputation Plavix noted, started 4/14 after stent placement now Right below knee amputation  on 4/17.     Thrombocytosis - likely acute phase reactant type of response  Chronic hyponatremia- SIADH-sodium  fluctuating between 129-132 - cont sodium chloride tablets , fluid restriction 1200 cc,     Hypertension continue Norvasc, hydralazine prn   EtOH Use Concern for Withdrawal of Alcohol and Opiates -Patient states he drinks at least 2 beers Daily -CIWA Protocol with Lorazepam, started on klonopin and patient has had minimal signs of alcohol withdrawal this admission  Tobacco Abuse Continue nicotine patch -Smoking Cessation Counseling Given  Hx of Polysubstance Abuse Has Hx of Cocaine Abuse/alcohol abuse .   Pain management Discontinued Percocet, started oxycodone 10 mg every 4 as needed , due to uncontrolled pain   DVT prophylaxsis Lovenox  Code Status:  Full code    Family Communication: Discussed in detail with the patient, all imaging results, lab results explained to the patient   Disposition Plan:  Pending amputation, will need SNF      Consultants:  Orthopedics-Dr Sharol Given  Vascular surgery  Procedures:  None  Antibiotics: Anti-infectives    Start     Dose/Rate Route Frequency Ordered Stop   07/23/16 1600  [MAR Hold]  vancomycin (VANCOCIN) 1,500 mg in sodium chloride 0.9 % 500 mL IVPB     (MAR Hold since 07/27/16 0835)   1,500 mg 250 mL/hr over 120 Minutes Intravenous Every 12 hours 07/23/16 1525     07/23/16 1600  [MAR Hold]  ceFEPIme (MAXIPIME) 1 g in dextrose 5 % 50 mL IVPB     (MAR Hold since 07/27/16 0835)   1 g 100 mL/hr over 30 Minutes Intravenous Every 8 hours 07/23/16 1525     07/20/16 2200  ceFEPIme (MAXIPIME) 1 g in dextrose 5 % 50 mL IVPB  Status:  Discontinued     1 g 100 mL/hr over 30 Minutes Intravenous Every 8 hours 07/20/16 1444 07/23/16 1525   07/20/16 2000  vancomycin (VANCOCIN) 1,500 mg in sodium chloride 0.9 % 500 mL IVPB  Status:  Discontinued     1,500 mg 250 mL/hr over 120 Minutes Intravenous Every 12 hours 07/20/16 1451 07/23/16 1525   07/20/16 1345  ceFEPIme (MAXIPIME) 2 g in dextrose 5 % 50 mL IVPB     2 g 100 mL/hr over 30  Minutes Intravenous  Once 07/20/16 1331 07/20/16 1418   07/20/16 1345  vancomycin (VANCOCIN) IVPB 1000 mg/200 mL premix     1,000 mg 200 mL/hr over 60 Minutes Intravenous  Once 07/20/16 1331 07/20/16 1559         HPI/Subjectiv Very anxious about his surgery   Objective: Vitals:   07/26/16 0955 07/26/16 1346 07/26/16 2005 07/27/16 0249  BP: (!) 156/63 (!) 145/74 (!) 149/64 (!) 141/72  Pulse: 87 86 89 85  Resp:  18 20 18   Temp:  97.8 F (36.6 C) 97.8 F (36.6 C) 98.2 F (36.8 C)  TempSrc:  Oral Oral Oral  SpO2:  97% 99% 97%  Weight:      Height:        Intake/Output Summary (Last 24 hours) at 07/27/16 0935 Last data filed at 07/27/16 2025  Gross per 24 hour  Intake              600 ml  Output             4025 ml  Net            -3425 ml    Exam:  Examination:  General exam: Appears calm and comfortable  Respiratory system: Clear to auscultation. Respiratory effort normal. Cardiovascular system: S1 & S2 heard, RRR. No JVD, murmurs, rubs, gallops or clicks. No pedal edema. Gastrointestinal system: Abdomen is nondistended, soft and nontender. No organomegaly or masses felt. Normal bowel sounds heard. Central nervous system:   No focal neurological deficits. Extremities: right foot covered in dressing  Skin: No rashes, lesions or ulcers Psychiatry: agitated      Data Reviewed: I have personally reviewed following labs and imaging studies  Micro Results Recent Results (from the past 240 hour(s))  MRSA PCR Screening     Status: None   Collection Time: 07/20/16  6:01 PM  Result Value Ref Range Status   MRSA by PCR NEGATIVE NEGATIVE Final    Comment:        The GeneXpert MRSA Assay (FDA approved for NASAL specimens only), is one component of a comprehensive MRSA colonization surveillance program. It is not intended to diagnose MRSA infection nor to guide or monitor treatment for MRSA infections.   Surgical pcr screen     Status: None   Collection Time:  07/23/16  3:58 AM  Result Value Ref Range Status   MRSA, PCR NEGATIVE NEGATIVE Final   Staphylococcus aureus NEGATIVE NEGATIVE Final    Comment:        The Xpert SA Assay (FDA approved for NASAL specimens in patients over 61 years of age), is one component of a comprehensive surveillance program.  Test performance has been validated by [facility] for patients greater than or equal to 88 year old. It is not intended to diagnose infection nor to guide or monitor treatment.     Radiology Reports Mr Foot Right Wo Contrast  Result Date: 07/15/2016 CLINICAL DATA:  Right foot pain  worsening since Saturday. Leg started swelling and patient has been on unable to walk nor put any pressure on it. EXAM: MRI OF THE RIGHT FOREFOOT WITHOUT CONTRAST TECHNIQUE: Multiplanar, multisequence MR imaging of the right forefoot was performed. No intravenous contrast was administered. COMPARISON:  Radiographs of the right foot from 07/15/2016 FINDINGS: Despite patient having received the maximum amount of medication, the study is markedly limited due to motion artifacts. Bones/Joint/Cartilage The patient is status post amputation at the base of the right proximal phalanx with suggestion of T1 hypointensity in T2 fat sat hyperintensity at site of amputation. Findings are suspicious for osteomyelitis. Ligaments Suboptimally assessed Muscles and Tendons Suboptimally assessed Soft tissues Soft tissue edema about the forefoot special along the plantar aspect of the first and second digits. IMPRESSION: Markedly compromised study due to patient motion artifacts. Marrow signal abnormalities at the amputation site of the base of the second proximal phalanx with surrounding soft tissue inflammation/edema raise concern for the presence of osteomyelitis at the stump. Electronically Signed   By: Ashley Royalty M.D.   On: 07/15/2016 20:08   Dg Chest Port 1 View  Result Date: 07/14/2016 CLINICAL DATA:  Fever with right foot infection  EXAM: PORTABLE CHEST 1 VIEW COMPARISON:  02/11/2010 FINDINGS: Surgical wire at the lower cervical spine. Minimal atelectasis right base. No consolidation or effusion. Heart size upper normal. Minimal atherosclerosis. No pneumothorax. IMPRESSION: Minimal atelectasis at the right base. No acute infiltrate or edema. Electronically Signed   By: Donavan Foil M.D.   On: 07/14/2016 18:26   Ap / Lateral X-ray Right Foot  Result Date: 07/10/2016 CLINICAL DATA:  RIGHT foot pain, recent amputation of second toe in February, pain and that this toe EXAM: RIGHT FOOT - 2 VIEW COMPARISON:  05/12/2016 FINDINGS: Interval amputation of second toe through the proximal phalanx. Poorly defined osseous stump at the proximal phalanx, may related to resorption secondary to amputation though infection is not excluded with this appearance. Mild soft tissue swelling at the stump in more diffusely at the dorsum of the entire RIGHT foot. Joint space narrowing at first MTP joint. Remaining joint spaces preserved. No acute fracture, dislocation or additional bone destruction. IMPRESSION: Prior amputation of RIGHT second toe through the proximal phalanx with poor definition of the osseous stump, could represent resorption secondary to resection but osteomyelitis is not excluded. If there is persistent clinical concern for osteomyelitis or soft tissue abscess in the RIGHT foot, consider MR. Electronically Signed   By: Lavonia Dana M.D.   On: 07/10/2016 16:19     CBC  Recent Labs Lab 07/20/16 1201 07/20/16 1335 07/23/16 0540 07/24/16 0325  WBC 8.1 8.0 7.1 9.6  HGB 11.2* 10.4* 10.4* 9.8*  HCT 31.5* 30.3* 30.5* 28.5*  PLT 643* 669* 644* 622*  MCV 84.9 84.6 87.4 86.9  MCH 30.2 29.1 29.8 29.9  MCHC 35.6 34.3 34.1 34.4  RDW 12.9 13.0 13.4 13.1  LYMPHSABS 2.0  --   --   --   MONOABS 0.8  --   --   --   EOSABS 0.2  --   --   --   BASOSABS 0.1  --   --   --     Chemistries   Recent Labs Lab 07/20/16 1201 07/20/16 1335  07/23/16 0540 07/24/16 0325 07/26/16 1121 07/27/16 0210  NA 133*  --  132* 130* 129*  --   K 4.0  --  4.0 4.0 3.9  --   CL 98*  --  97*  95* 97*  --   CO2 25  --  26 24 22   --   GLUCOSE 89  --  108* 109* 132*  --   BUN 8  --  <5* 6 6  --   CREATININE 0.72 0.64 0.62 0.67 0.66 0.68  CALCIUM 9.2  --  9.2 9.3 8.6*  --    ------------------------------------------------------------------------------------------------------------------ estimated creatinine clearance is 94.5 mL/min (by C-G formula based on SCr of 0.68 mg/dL). ------------------------------------------------------------------------------------------------------------------ No results for input(s): HGBA1C in the last 72 hours. ------------------------------------------------------------------------------------------------------------------ No results for input(s): CHOL, HDL, LDLCALC, TRIG, CHOLHDL, LDLDIRECT in the last 72 hours. ------------------------------------------------------------------------------------------------------------------ No results for input(s): TSH, T4TOTAL, T3FREE, THYROIDAB in the last 72 hours.  Invalid input(s): FREET3 ------------------------------------------------------------------------------------------------------------------ No results for input(s): VITAMINB12, FOLATE, FERRITIN, TIBC, IRON, RETICCTPCT in the last 72 hours.  Coagulation profile  Recent Labs Lab 07/23/16 0540  INR 1.14    No results for input(s): DDIMER in the last 72 hours.  Cardiac Enzymes No results for input(s): CKMB, TROPONINI, MYOGLOBIN in the last 168 hours.  Invalid input(s): CK ------------------------------------------------------------------------------------------------------------------ Invalid input(s): POCBNP   CBG: No results for input(s): GLUCAP in the last 168 hours.     Studies: No results found.    Lab Results  Component Value Date   HGBA1C 6.2 (H) 07/11/2016   Lab Results   Component Value Date   CREATININE 0.68 07/27/2016       Scheduled Meds: . [MAR Hold] amLODipine  10 mg Oral Daily  . [MAR Hold] aspirin EC  81 mg Oral Daily  . [MAR Hold] clopidogrel  75 mg Oral Daily  . [MAR Hold] docusate sodium  100 mg Oral Daily  . [MAR Hold] enoxaparin (LOVENOX) injection  40 mg Subcutaneous Q24H  . [MAR Hold] nicotine  21 mg Transdermal Daily  . [MAR Hold] senna-docusate  2 tablet Oral QHS  . [MAR Hold] sodium chloride flush  3 mL Intravenous Q12H  . [MAR Hold] sodium chloride  1 g Oral BID WC   Continuous Infusions: . [MAR Hold] sodium chloride    . sodium chloride 100 mL/hr at 07/23/16 0612  . sodium chloride 75 mL/hr at 07/26/16 2051  . [MAR Hold] ceFEPime (MAXIPIME) IV    . lactated ringers 10 mL/hr at 07/27/16 0855  . [MAR Hold] vancomycin       LOS: 7 days    Time spent: >30 MINS    Reyne Dumas  [facility] Pager [phone]. If 7PM-7AM, please contact night-coverage at [email or web address], password [facility] 07/27/2016, 9:35 AM  LOS: 7 days

## 2016-07-27 NOTE — Plan of Care (Signed)
Problem: Pain Managment: Goal: General experience of comfort will improve Outcome: Not Progressing Patient high tolerance. Stays up even 10 minutes after , he wants only IV pain medication.

## 2016-07-27 NOTE — Anesthesia Postprocedure Evaluation (Addendum)
Anesthesia Post Note  Patient: Calvin Mckee  Procedure(s) Performed: Procedure(s) (LRB): RIGHT BELOW KNEE AMPUTATION (Right)  Patient location during evaluation: PACU Anesthesia Type: General Level of consciousness: awake and alert, oriented and patient cooperative Pain management: pain level controlled Vital Signs Assessment: post-procedure vital signs reviewed and stable Respiratory status: spontaneous breathing, nonlabored ventilation, respiratory function stable and patient connected to nasal cannula oxygen Cardiovascular status: blood pressure returned to baseline and stable Postop Assessment: no signs of nausea or vomiting Anesthetic complications: no       Last Vitals:  Vitals:   07/27/16 1215 07/27/16 1301  BP:  137/88  Pulse: (!) 108 (!) 110  Resp: 20 18  Temp: 37.1 C 36.9 C    Last Pain:  Vitals:   07/27/16 1301  TempSrc: Oral  PainSc:                  Calvin Mckee,Calvin Mckee

## 2016-07-27 NOTE — Op Note (Signed)
    Patient name: Calvin Mckee MRN: [id number] DOB: [birth year] Sex: male  07/27/2016 Pre-operative Diagnosis: critical right lower extremity ischemia with foot gangrene Post-operative diagnosis:  Same Surgeon:  Erlene Quan C. Donzetta Matters, MD Assistant:  Leontine Locket, PA Procedure Performed: Right below knee amputation  Indications:  66 year old male history of right second toe amputation with wound breakdown and then had subsequent right SFA stenting for revascularization. He now has combined wet and dry gangrene of his right foot and is indicated for below knee amputation.  Findings: There was adequate bleeding of his below-knee of dictation site. The muscle was edematous and had to be and. A completion the skin opposed well wound was hemostatic.   Procedure:  The patient was identified in the holding area and taken to the operating room radius placed supine on the operating table general anesthesia was induced, sterilly preppred and draped in the usual fashion and timeout called. He is already on antibiotics that were given at there scheduled time. We began by marking out our incision site 10; below the tibial tuberosity. We then he's an Esmarch segment right lower extremity inflated tourniquet 200 mmHg. Total time for this inflation was 7 minutes. We then performed incision and did have significant bleeding despite tourniquet placement. We dissected down onto the tibia elevated the periosteum divided this with a Gigli saw. We then dissected with cautery to the fibula transected this with bone cutter. Posterior flap was created with indentation knife and leg sent as specimen. Bleeding vessels were clamped and the tourniquet was let down. We then obtained hemostasis with combination of cautery as well as suture ligation of the vessels. The soleus gastrocnemius muscles were debulked as they were quite edematous. The anterior and posterior fascia was then approximated with 2-0 Vicryl sutures. The skin was then  trimmed and closed with staples. Dry dressing was placed with Ace wrap at the level of skin patient not awake from anesthesia having tolerated the procedure well without immediate complication. Instrument count was correct at completion.  Blood loss 300 mL.    Deeana Atwater C. Donzetta Matters, MD [facility] Office: [phone] Pager: [phone]

## 2016-07-28 ENCOUNTER — Encounter (HOSPITAL_COMMUNITY): Payer: Self-pay | Admitting: Vascular Surgery

## 2016-07-28 DIAGNOSIS — Z89511 Acquired absence of right leg below knee: Secondary | ICD-10-CM

## 2016-07-28 DIAGNOSIS — D649 Anemia, unspecified: Secondary | ICD-10-CM

## 2016-07-28 LAB — COMPREHENSIVE METABOLIC PANEL
ALK PHOS: 44 U/L (ref 38–126)
ALT: 27 U/L (ref 17–63)
ANION GAP: 6 (ref 5–15)
AST: 34 U/L (ref 15–41)
Albumin: 2.5 g/dL — ABNORMAL LOW (ref 3.5–5.0)
BUN: 7 mg/dL (ref 6–20)
CALCIUM: 8.6 mg/dL — AB (ref 8.9–10.3)
CO2: 28 mmol/L (ref 22–32)
Chloride: 97 mmol/L — ABNORMAL LOW (ref 101–111)
Creatinine, Ser: 0.67 mg/dL (ref 0.61–1.24)
GFR calc non Af Amer: >60 mL/min (ref >60)
Glucose, Bld: 117 mg/dL — ABNORMAL HIGH (ref 65–99)
Potassium: 4.1 mmol/L (ref 3.5–5.1)
SODIUM: 131 mmol/L — AB (ref 135–145)
TOTAL PROTEIN: 6.6 g/dL (ref 6.5–8.1)
Total Bilirubin: 0.6 mg/dL (ref 0.3–1.2)

## 2016-07-28 LAB — CBC
HCT: 20.3 % — ABNORMAL LOW (ref 39.0–52.0)
HEMOGLOBIN: 6.8 g/dL — AB (ref 13.0–17.0)
MCH: 28.9 pg (ref 26.0–34.0)
MCHC: 33.5 g/dL (ref 30.0–36.0)
MCV: 86.4 fL (ref 78.0–100.0)
Platelets: 518 10*3/uL — ABNORMAL HIGH (ref 150–400)
RBC: 2.35 MIL/uL — AB (ref 4.22–5.81)
RDW: 12.9 % (ref 11.5–15.5)
WBC: 9.1 10*3/uL (ref 4.0–10.5)

## 2016-07-28 LAB — ABO/RH: ABO/RH(D): B POS

## 2016-07-28 LAB — HEMOGLOBIN AND HEMATOCRIT, BLOOD
HCT: 21.2 % — ABNORMAL LOW (ref 39.0–52.0)
Hemoglobin: 7.3 g/dL — ABNORMAL LOW (ref 13.0–17.0)

## 2016-07-28 LAB — PREPARE RBC (CROSSMATCH)

## 2016-07-28 MED ORDER — SODIUM CHLORIDE 0.9 % IV SOLN
Freq: Once | INTRAVENOUS | Status: AC
  Administered 2016-07-28: 06:00:00 via INTRAVENOUS

## 2016-07-28 NOTE — Progress Notes (Signed)
PROGRESS NOTE    Calvin Mckee  VQM:[id number] DOB: 1951-03-16 DOA: 07/20/2016 PCP: Pcp Not In System    Brief Narrative:  66 yo male with persistent right foot infection osteomyelitis and cellulitis, had surgery and IV outpatient antibiotics with worsening symptoms at the nursing home. Had right 2nd toe amputation on 03/27, hospitalization 3/31 to 4/7 for IV antibiotics. Orthopedic and vascular service consulted, underwent angioplasty and stenting on the right superficial femoral artery on 4/13, right below the knee amputation on the right at 4/17.    Assessment & Plan:   Active Problems:   Osteomyelitis (HCC)   Gangrene of right foot (HCC)   SIAD (syndrome of inappropriate antidiuresis) (HCC)   Benign essential HTN   Right foot infection   1. Right lower extremity ischemia with foot gangrene. Patient underwent amputation with no major complication, will continue IV antibiotics for now, cefepime and vamcomycin, will discuss with surgery of discontinuing antibiotics before discharge, continue pain control with morphine and oxycodone. Continue antiplatelet therapy with asa and plavix, patient sp stent placement.   2. SIADH. Serum Na at 131, will continue to follow renal panel in am, patient tolerating po well, no nausea or vomiting.   3. HTN. Will continue blood pressure control with amlodipine and continue as needed hydralazine and labetalol. Systolic blood pressure 932 to 150.   4. Etho abuse. No signs of withdrawal, will continue neuro checks per unit protocol.  Continue clonazepam, as needed.   5. Polysubstance abuse. Will continue pain control with opiates for now, with plan to transition to non opiates at discharge.  Continue bowel regimen.   6. Acute anemia, no signs of active bleeding. Wound with no signs of bleeding, will continue asa and plavix for peripheral vascular disease. 1 unit prbc has been transfused, follow on cell count in am.    DVT prophylaxis: enoxaparin    Code Status: full  Family Communication: No family at the bedside  Disposition Plan: home    Consultants:   Orthopedics  Vascular surgery   Procedures:  04/13 Abdominal aortogram with right lower extremity runoff right superficial femoral popliteal stent (6 x 100 mm Abbott 2)  04/17  Right below knee amputation   Antimicrobials:  Cefepime Vancomycin    Subjective: Patient complains of pain at the right lower extremity, moderate to severe in intensity, no radiation, improved with pain medications. No nausea or vomiting. Scheduled for blood transfusion today.   Objective: Vitals:   07/27/16 1215 07/27/16 1301 07/27/16 2043 07/28/16 0500  BP:  137/88 (!) 155/64 (!) 155/59  Pulse: (!) 108 (!) 110 90 97  Resp: 20 18 18 18   Temp: 98.8 F (37.1 C) 98.5 F (36.9 C) 98.9 F (37.2 C) 99.7 F (37.6 C)  TempSrc:  Oral Oral Oral  SpO2: 100% 100% 97% 100%  Weight:      Height:        Intake/Output Summary (Last 24 hours) at 07/28/16 0747 Last data filed at 07/28/16 0606  Gross per 24 hour  Intake             2280 ml  Output             2340 ml  Net              -60 ml   Filed Weights   07/20/16 0945  Weight: 74.8 kg (165 lb)    Examination:  General exam: not in pain or dyspnea E ENT: mild pallor, no icterus, oral mucosa  moist.  Respiratory system: Clear to auscultation. Respiratory effort normal. No wheezing, rales or rhonchi Cardiovascular system: S1 & S2 heard, RRR. No JVD, murmurs, rubs, gallops or clicks. No pedal edema. Gastrointestinal system: Abdomen is nondistended, soft and nontender. No organomegaly or masses felt. Normal bowel sounds heard. Central nervous system: Alert and oriented. No focal neurological deficits. Extremities: .Right lower extremity below knee amputation. Dressing in place, no signs of edema or local bleeding.  Skin: No rashes, lesions or ulcers  Data Reviewed: I have personally reviewed following labs and imaging  studies  CBC:  Recent Labs Lab 07/23/16 0540 07/24/16 0325 07/28/16 0221  WBC 7.1 9.6 9.1  HGB 10.4* 9.8* 6.8*  HCT 30.5* 28.5* 20.3*  MCV 87.4 86.9 86.4  PLT 644* 622* 563*   Basic Metabolic Panel:  Recent Labs Lab 07/23/16 0540 07/24/16 0325 07/26/16 1121 07/27/16 0210 07/28/16 0221  NA 132* 130* 129*  --  131*  K 4.0 4.0 3.9  --  4.1  CL 97* 95* 97*  --  97*  CO2 26 24 22   --  28  GLUCOSE 108* 109* 132*  --  117*  BUN <5* 6 6  --  7  CREATININE 0.62 0.67 0.66 0.68 0.67  CALCIUM 9.2 9.3 8.6*  --  8.6*   GFR: Estimated Creatinine Clearance: 94.5 mL/min (by C-G formula based on SCr of 0.67 mg/dL). Liver Function Tests:  Recent Labs Lab 07/28/16 0221  AST 34  ALT 27  ALKPHOS 44  BILITOT 0.6  PROT 6.6  ALBUMIN 2.5*   No results for input(s): LIPASE, AMYLASE in the last 168 hours. No results for input(s): AMMONIA in the last 168 hours. Coagulation Profile:  Recent Labs Lab 07/23/16 0540  INR 1.14   Cardiac Enzymes: No results for input(s): CKTOTAL, CKMB, CKMBINDEX, TROPONINI in the last 168 hours. BNP (last 3 results) No results for input(s): PROBNP in the last 8760 hours. HbA1C: No results for input(s): HGBA1C in the last 72 hours. CBG:  Recent Labs Lab 07/27/16 1710 07/27/16 2039  GLUCAP 149* 160*   Lipid Profile: No results for input(s): CHOL, HDL, LDLCALC, TRIG, CHOLHDL, LDLDIRECT in the last 72 hours. Thyroid Function Tests: No results for input(s): TSH, T4TOTAL, FREET4, T3FREE, THYROIDAB in the last 72 hours. Anemia Panel: No results for input(s): VITAMINB12, FOLATE, FERRITIN, TIBC, IRON, RETICCTPCT in the last 72 hours. Sepsis Labs: No results for input(s): PROCALCITON, LATICACIDVEN in the last 168 hours.  Recent Results (from the past 240 hour(s))  MRSA PCR Screening     Status: None   Collection Time: 07/20/16  6:01 PM  Result Value Ref Range Status   MRSA by PCR NEGATIVE NEGATIVE Final    Comment:        The GeneXpert MRSA  Assay (FDA approved for NASAL specimens only), is one component of a comprehensive MRSA colonization surveillance program. It is not intended to diagnose MRSA infection nor to guide or monitor treatment for MRSA infections.   Surgical pcr screen     Status: None   Collection Time: 07/23/16  3:58 AM  Result Value Ref Range Status   MRSA, PCR NEGATIVE NEGATIVE Final   Staphylococcus aureus NEGATIVE NEGATIVE Final    Comment:        The Xpert SA Assay (FDA approved for NASAL specimens in patients over 40 years of age), is one component of a comprehensive surveillance program.  Test performance has been validated by [facility] for patients greater than or equal to  10 year old. It is not intended to diagnose infection nor to guide or monitor treatment.          Radiology Studies: No results found.      Scheduled Meds: . amLODipine  10 mg Oral Daily  . aspirin EC  81 mg Oral Daily  . clopidogrel  75 mg Oral Daily  . docusate sodium  100 mg Oral Daily  . enoxaparin (LOVENOX) injection  40 mg Subcutaneous Q24H  . nicotine  21 mg Transdermal Daily  . pantoprazole  40 mg Oral Daily  . senna-docusate  2 tablet Oral QHS  . sodium chloride flush  3 mL Intravenous Q12H  . sodium chloride  1 g Oral BID WC   Continuous Infusions: . sodium chloride    . sodium chloride 100 mL/hr at 07/23/16 0612  . ceFEPime (MAXIPIME) IV    . vancomycin       LOS: 8 days     Mauricio Gerome Apley, MD [facility] Pager [phone]  If 7PM-7AM, please contact night-coverage [email or web address] Password [facility] 07/28/2016, 7:47 AM

## 2016-07-28 NOTE — Progress Notes (Signed)
HGB 6.8. On call MD notified.

## 2016-07-28 NOTE — Progress Notes (Signed)
Pt. has not been alert when given IV pain medications. Pt. Becomes drowsy and forgetful.

## 2016-07-28 NOTE — Progress Notes (Signed)
Clinical Social Worker met patient at bedside to offer support and discuss patient discharging to SNF. Patient stated before coming into [facility] he was at [facility] for a week and would like to return. CSW spoke to admissions coordinator ([place]) and Lynelle Smoke is aware that patient is currently admitted in [place] but facility is willing to take patient back once medically ready.   Rhea Pink, MSW,  [place]

## 2016-07-28 NOTE — Progress Notes (Addendum)
[facility] [place]  Subjective  - Patient is comfortable   Objective (!) 155/59 97 99.7 F (37.6 C) (Oral) 18 100%  Intake/Output Summary (Last 24 hours) at 07/28/16 0933 Last data filed at 07/28/16 0606  Gross per 24 hour  Intake             1780 ml  Output             2340 ml  Net             -560 ml   Right BKA dressing clean and dry   Assessment/Planning: POD # 1 right BKA  We will plan on changing the dressing tomorrow  Laurence Slate [facility] 07/28/2016 9:33 AM --  Laboratory Lab Results:  Recent Labs  07/28/16 0221  WBC 9.1  HGB 6.8*  HCT 20.3*  PLT 518*   BMET  Recent Labs  07/26/16 1121 07/27/16 0210 07/28/16 0221  NA 129*  --  131*  K 3.9  --  4.1  CL 97*  --  97*  CO2 22  --  28  GLUCOSE 132*  --  117*  BUN 6  --  7  CREATININE 0.66 0.68 0.67  CALCIUM 8.6*  --  8.6*    COAG Lab Results  Component Value Date   INR 1.14 07/23/2016   INR 1.0 02/06/2007   No results found for: PTT  I have independently interviewed patient and agree with PA assessment and plan above. Possibly transfuse for acute blood loss anemia. Will look at wound tomorrow.   Celena Lanius C. Donzetta Matters, MD [facility] [place] Office: [phone] Pager: [phone]

## 2016-07-28 NOTE — Evaluation (Signed)
Physical Therapy Evaluation Patient Details Name: Calvin Mckee MRN: [id number] DOB: 1950/12/11 Today's Date: 07/28/2016   History of Present Illness  Pt is a 66 y.o. male s/p R BKA. PMHx: HTN, ETOH use, Cocaine and tobacco abuse.   Clinical Impression  Pt presents with decreased strength, balance and activity tolerance secondary to above. Pt tolerated OOB mobility to chair, however, was limited by asymptomatic low hemoglobin value. Pt admit from SNF and required assist with all mobility. Pt educated on LE strengthening, desensitization and phantom limb pain management. Recommend d/c to SNF for safe transition home when medically ready. Acute PT will follow.     Follow Up Recommendations SNF    Equipment Recommendations  None recommended by PT    Recommendations for Other Services       Precautions / Restrictions Precautions Precautions: Fall Restrictions Weight Bearing Restrictions: Yes RLE Weight Bearing: Non weight bearing      Mobility  Bed Mobility Overal bed mobility: Needs Assistance Bed Mobility: Sit to Supine       Sit to supine: Min guard   General bed mobility comments: min guard for safety to adjust in bed with verbal cues and heavy use of UE  Transfers Overall transfer level: Needs assistance Equipment used: Rolling walker (2 wheeled) Transfers: Stand Pivot Transfers   Stand pivot transfers: Min assist       General transfer comment: verbal cues to scoot to EOB, hand placement and safety with RW. increased time and effort to come to stand. minA to power up. pt able to hop on LLE with verbal cues and minA for balance. std pvt to/from recliner  Ambulation/Gait             General Gait Details: limited to steps to chair  Stairs            Wheelchair Mobility    Modified Rankin (Stroke Patients Only)       Balance Overall balance assessment: Needs assistance Sitting-balance support: No upper extremity supported;Feet  supported Sitting balance-Leahy Scale: Fair     Standing balance support: Bilateral upper extremity supported Standing balance-Leahy Scale: Poor                               Pertinent Vitals/Pain Pain Assessment: 0-10 Pain Score: 10-Worst pain ever Pain Location: surgical site Pain Descriptors / Indicators: Discomfort;Constant;Aching Pain Intervention(s): Limited activity within patient's tolerance;Monitored during session;Repositioned    Home Living Family/patient expects to be discharged to:: Skilled nursing facility                 Additional Comments: PTA pt was at SNF and plans to return to SNF    Prior Function Level of Independence: Needs assistance   Gait / Transfers Assistance Needed: pt states he had assist at SNF for amb with RW  ADL's / Homemaking Assistance Needed: Pt states he was receiving assist with bathing at SNF        Hand Dominance   Dominant Hand: Right    Extremity/Trunk Assessment   Upper Extremity Assessment Upper Extremity Assessment: Overall WFL for tasks assessed    Lower Extremity Assessment Lower Extremity Assessment: RLE deficits/detail RLE Deficits / Details: s/p R BKA. pt able to initiate quad set and LAQ. pt able to perform hip abduction/adduction/flexion/extension    Cervical / Trunk Assessment Cervical / Trunk Assessment: Kyphotic  Communication   Communication: No difficulties  Cognition Arousal/Alertness: Awake/alert Behavior During Therapy:  WFL for tasks assessed/performed Overall Cognitive Status: Impaired/Different from baseline Area of Impairment: Memory;Following commands;Safety/judgement                     Memory: Decreased short-term memory Following Commands: Follows one step commands consistently Safety/Judgement: Decreased awareness of safety;Decreased awareness of deficits     General Comments: pt unaware of deficits and states he doesn't need help during transfer. pt followed one  step commands throughout transfer consistently.       General Comments      Exercises Amputee Exercises Quad Sets: Strengthening;Right;10 reps;Seated Gluteal Sets: Strengthening;Both;10 reps;Seated Hip ABduction/ADduction: AROM;10 reps;Seated Hip Flexion/Marching: AROM;Right;10 reps;Seated Knee Extension: AROM;Strengthening;Right;10 reps;Seated   Assessment/Plan    PT Assessment Patient needs continued PT services  PT Problem List Decreased strength;Decreased range of motion;Decreased activity tolerance;Decreased balance;Decreased mobility;Decreased knowledge of use of DME;Decreased safety awareness;Decreased knowledge of precautions;Pain       PT Treatment Interventions DME instruction;Gait training;Functional mobility training;Therapeutic activities;Therapeutic exercise;Balance training    PT Goals (Current goals can be found in the Care Plan section)  Acute Rehab PT Goals Patient Stated Goal: get back to rehab PT Goal Formulation: With patient Time For Goal Achievement: 08/11/16 Potential to Achieve Goals: Good    Frequency Min 3X/week   Barriers to discharge        Co-evaluation PT/OT/SLP Co-Evaluation/Treatment: Yes Reason for Co-Treatment: For patient/therapist safety;Necessary to address cognition/behavior during functional activity PT goals addressed during session: Mobility/safety with mobility;Balance;Proper use of DME;Strengthening/ROM OT goals addressed during session: ADL's and self-care       End of Session Equipment Utilized During Treatment: Gait belt Activity Tolerance: Patient tolerated treatment well Patient left: in bed;with call bell/phone within reach;with bed alarm set;with nursing/sitter in room Nurse Communication: Mobility status PT Visit Diagnosis: Unsteadiness on feet (R26.81);Other abnormalities of gait and mobility (R26.89);Muscle weakness (generalized) (M62.81);Difficulty in walking, not elsewhere classified (R26.2);Pain Pain -  Right/Left: Right Pain - part of body: Leg    Time: 1000-1016 PT Time Calculation (min) (ACUTE ONLY): 16 min   Charges:   PT Evaluation $PT Eval Moderate Complexity: 1 Procedure     PT G CodesTracie Harrier, SPT Acute Rehab SPT [phone]    Tracie Harrier 07/28/2016, 1:57 PM

## 2016-07-28 NOTE — Evaluation (Signed)
Occupational Therapy Evaluation Patient Details Name: Calvin Mckee MRN: [id number] DOB: 01/27/1951 Today's Date: 07/28/2016    History of Present Illness Pt is a 66 y.o. male s/p R BKA. PMHx: HTN, ETOH use, Cocaine and tobacco abuse.    Clinical Impression   Pt reports he has required assist from SNF staff with ADL PTA. Currently pt min assist for stand pivot transfers, supervision for seated UB ADL, and mod assist for LB ADL. Recommending return to SNF upon d/c for continued rehab to maximize independence and safety with ADL and functional mobility. Pt would benefit from continued skilled OT to address established goals.    Follow Up Recommendations  SNF;Supervision/Assistance - 24 hour    Equipment Recommendations  Other (comment) (TBD at next venue)    Recommendations for Other Services       Precautions / Restrictions Precautions Precautions: Fall Restrictions Weight Bearing Restrictions: Yes RLE Weight Bearing: Non weight bearing      Mobility Bed Mobility Overal bed mobility: Needs Assistance Bed Mobility: Sit to Supine       Sit to supine: Min guard   General bed mobility comments: min guard for safety to adjust in bed with verbal cues and heavy use of UE  Transfers Overall transfer level: Needs assistance Equipment used: Rolling walker (2 wheeled) Transfers: Sit to/from [facility] Sit to Stand: Min assist Stand pivot transfers: Min assist       General transfer comment: verbal cues to scoot to EOB, hand placement and safety with RW. increased time and effort to come to stand. minA to power up. pt able to hop on LLE with verbal cues and minA for balance. std pvt to/from recliner    Balance Overall balance assessment: Needs assistance Sitting-balance support: No upper extremity supported;Feet supported Sitting balance-Leahy Scale: Fair     Standing balance support: Bilateral upper extremity supported Standing balance-Leahy Scale:  Poor                             ADL either performed or assessed with clinical judgement   ADL Overall ADL's : Needs assistance/impaired Eating/Feeding: Set up;Sitting   Grooming: Set up;Supervision/safety;Sitting   Upper Body Bathing: Set up;Supervision/ safety;Sitting   Lower Body Bathing: Moderate assistance;Sit to/from stand   Upper Body Dressing : Set up;Supervision/safety;Sitting   Lower Body Dressing: Moderate assistance;Sit to/from stand   Toilet Transfer: Minimal Systems analyst Details (indicate cue type and reason): Simulated by bed <> chair         Functional mobility during ADLs: Minimal assistance;Rolling walker;Cueing for safety;Cueing for sequencing (for stand pivot only)       Vision         Perception     Praxis      Pertinent Vitals/Pain Pain Assessment: 0-10 Pain Score: 10-Worst pain ever Pain Location: RLE  Pain Descriptors / Indicators: Aching;Discomfort Pain Intervention(s): Limited activity within patient's tolerance;Monitored during session;Repositioned     Hand Dominance Right   Extremity/Trunk Assessment Upper Extremity Assessment Upper Extremity Assessment: Overall WFL for tasks assessed   Lower Extremity Assessment Lower Extremity Assessment: Defer to PT evaluation RLE Deficits / Details: s/p R BKA. pt able to initiate quad set and LAQ. pt able to perform hip abduction/adduction/flexion/extension   Cervical / Trunk Assessment Cervical / Trunk Assessment: Kyphotic   Communication Communication Communication: No difficulties   Cognition Arousal/Alertness: Awake/alert Behavior During Therapy: WFL for tasks assessed/performed Overall Cognitive Status: No family/caregiver present  to determine baseline cognitive functioning Area of Impairment: Memory;Following commands;Safety/judgement                     Memory: Decreased short-term memory Following Commands: Follows one step  commands consistently Safety/Judgement: Decreased awareness of safety;Decreased awareness of deficits     General Comments: pt unaware of deficits and states he doesn't need help during transfer. pt followed one step commands throughout transfer consistently.    General Comments       Exercises Exercises: Amputee Amputee Exercises Quad Sets: Strengthening;Right;10 reps;Seated Gluteal Sets: Strengthening;Both;10 reps;Seated Hip ABduction/ADduction: AROM;10 reps;Seated Hip Flexion/Marching: AROM;Right;10 reps;Seated Knee Extension: AROM;Strengthening;Right;10 reps;Seated   Shoulder Instructions      Home Living Family/patient expects to be discharged to:: Skilled nursing facility                                 Additional Comments: PTA pt was at SNF and plans to return to SNF      Prior Functioning/Environment Level of Independence: Needs assistance  Gait / Transfers Assistance Needed: pt states he had assist at SNF for amb with RW ADL's / Homemaking Assistance Needed: Pt states he was receiving assist with bathing at SNF            OT Problem List: Decreased strength;Decreased activity tolerance;Impaired balance (sitting and/or standing);Decreased cognition;Decreased safety awareness;Decreased knowledge of use of DME or AE;Decreased knowledge of precautions;Pain      OT Treatment/Interventions: Self-care/ADL training;Therapeutic exercise;Energy conservation;DME and/or AE instruction;Therapeutic activities;Patient/family education;Balance training    OT Goals(Current goals can be found in the care plan section) Acute Rehab OT Goals Patient Stated Goal: get back to rehab OT Goal Formulation: With patient Time For Goal Achievement: 08/11/16 Potential to Achieve Goals: Good ADL Goals Pt Will Perform Lower Body Bathing: with supervision;sit to/from stand Pt Will Perform Lower Body Dressing: with supervision;sit to/from stand Pt Will Transfer to Toilet: with  supervision;ambulating;bedside commode (over toilet) Pt Will Perform Toileting - Clothing Manipulation and hygiene: with supervision;sit to/from stand  OT Frequency: Min 2X/week   Barriers to D/C:            Co-evaluation PT/OT/SLP Co-Evaluation/Treatment: Yes Reason for Co-Treatment: For patient/therapist safety;Necessary to address cognition/behavior during functional activity PT goals addressed during session: Mobility/safety with mobility;Balance;Proper use of DME;Strengthening/ROM OT goals addressed during session: ADL's and self-care      End of Session Equipment Utilized During Treatment: Rolling walker;Gait belt Nurse Communication: Mobility status  Activity Tolerance: Patient tolerated treatment well Patient left: in bed;with call bell/phone within reach;with bed alarm set;with family/visitor present  OT Visit Diagnosis: Other abnormalities of gait and mobility (R26.89)                Time: 1000-1016 OT Time Calculation (min): 16 min Charges:  OT General Charges $OT Visit: 1 Procedure OT Evaluation $OT Eval Moderate Complexity: 1 Procedure G-Codes:     Sammy Douthitt A. Ulice Brilliant, M.S., OTR/L Pager: [phone] 07/28/2016, 2:19 PM

## 2016-07-29 DIAGNOSIS — D649 Anemia, unspecified: Secondary | ICD-10-CM

## 2016-07-29 LAB — CBC
HCT: 20.7 % — ABNORMAL LOW (ref 39.0–52.0)
Hemoglobin: 7 g/dL — ABNORMAL LOW (ref 13.0–17.0)
MCH: 29 pg (ref 26.0–34.0)
MCHC: 33.8 g/dL (ref 30.0–36.0)
MCV: 85.9 fL (ref 78.0–100.0)
Platelets: 462 K/uL — ABNORMAL HIGH (ref 150–400)
RBC: 2.41 MIL/uL — ABNORMAL LOW (ref 4.22–5.81)
RDW: 13.1 % (ref 11.5–15.5)
WBC: 9.9 K/uL (ref 4.0–10.5)

## 2016-07-29 LAB — BASIC METABOLIC PANEL
Anion gap: 8 (ref 5–15)
CHLORIDE: 97 mmol/L — AB (ref 101–111)
CO2: 25 mmol/L (ref 22–32)
Calcium: 8.2 mg/dL — ABNORMAL LOW (ref 8.9–10.3)
Creatinine, Ser: 0.63 mg/dL (ref 0.61–1.24)
GFR calc Af Amer: >60 mL/min (ref >60)
GFR calc non Af Amer: >60 mL/min (ref >60)
Glucose, Bld: 106 mg/dL — ABNORMAL HIGH (ref 65–99)
POTASSIUM: 3.6 mmol/L (ref 3.5–5.1)
SODIUM: 130 mmol/L — AB (ref 135–145)

## 2016-07-29 LAB — HEMOGLOBIN AND HEMATOCRIT, BLOOD
HCT: 26.6 % — ABNORMAL LOW (ref 39.0–52.0)
Hemoglobin: 8.9 g/dL — ABNORMAL LOW (ref 13.0–17.0)

## 2016-07-29 LAB — PREPARE RBC (CROSSMATCH)

## 2016-07-29 MED ORDER — SODIUM CHLORIDE 1 G PO TABS: 1.0000 g | ORAL_TABLET | Freq: Two times a day (BID) | ORAL | 0 refills | Status: AC

## 2016-07-29 MED ORDER — OXYCODONE-ACETAMINOPHEN 5-325 MG PO TABS: 1.0000 | ORAL_TABLET | Freq: Three times a day (TID) | ORAL | 0 refills | Status: DC | PRN

## 2016-07-29 MED ORDER — CLONAZEPAM 0.5 MG PO TABS: 0.2500 mg | ORAL_TABLET | Freq: Three times a day (TID) | ORAL | 0 refills | Status: DC | PRN

## 2016-07-29 MED ORDER — CLOPIDOGREL BISULFATE 75 MG PO TABS: 75.0000 mg | ORAL_TABLET | Freq: Every day | ORAL | Status: DC

## 2016-07-29 MED ORDER — ASPIRIN 81 MG PO TBEC: 81.0000 mg | DELAYED_RELEASE_TABLET | Freq: Every day | ORAL | Status: DC

## 2016-07-29 MED ORDER — SODIUM CHLORIDE 0.9 % IV SOLN
Freq: Once | INTRAVENOUS | Status: AC
  Administered 2016-07-29: 09:00:00 via INTRAVENOUS

## 2016-07-29 NOTE — Care Management Important Message (Signed)
Important Message  Patient Details  Name: Calvin Mckee MRN: [id number] Date of Birth: 11/19/1950   Medicare Important Message Given:  Yes    Nathen May 07/29/2016, 12:10 PM

## 2016-07-29 NOTE — Discharge Summary (Addendum)
Physician Discharge Summary  Calvin Mckee FFM:[id number] DOB: Mar 24, 1951 DOA: 07/20/2016  PCP: Pcp Not In System  Admit date: 07/20/2016 Discharge date: 07/29/2016  Admitted From:SNF Disposition:SNF  Recommendations for Outpatient Follow-up:  1. Follow up with PCP in 1-2 weeks 2. Please obtain BMP/CBC in one week   Home Health:SNF Equipment/Devices:none Discharge Condition:stable CODE STATUS:full Diet recommendation:heart healthy  Brief/Interim Summary: 66 y.o. male with medical history of HTN, ETOH use, Cocaine and tobacco abuse, right second toe amputation 3/6 in [place] who developed dehiscence of the wound and was started on antibiotics, presented with R foot pain on 3/31 and infection, started on Vanc and Cefepime. MRI on 4/5 showed possible osteomyelitis at the stump and [facility] were consulted and recommend to continue antibiotics. He was discharged on Cipro and Doxy to SNF on 4/7. Patient was sent by SNF further evaluation of worsening wound.    # Right lower extremity ischemia with foot gangrene.  Orthopedic and vascular service consulted, underwent angioplasty and stenting on the right superficial femoral artery on 4/13. 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 -pain is controlled. Discussed with vascular surgeon Dr. Erlene Quan who discontinued antibiotics and recommended to continue aspirin and Plavix on discharge. Patient will follow-up with PCP and vascular surgeon outpatient. Patient is clinically improved.  # SIADH. Serum Na at 130, continue salt tablets. Repeat lab outpatient.  # HTN. Continue amlodipine. Monitor blood pressure closely.  # Etho abuse. No sign of withdrawal. Currently on benzos as needed for anxiety.  #Polysubstance abuse. Education provided to the patient.  # Acute anemia likely acute blood loss anemia during OR: No sign of active bleeding now. Transfusion 2 units of red blood cell today as per vascular  surgery. Plan to repeat CBC after water. I recommended to have repeat CBC within a week outpatient.   Discharge Diagnoses:  Active Problems:   Osteomyelitis (HCC)   Gangrene of right foot (HCC)   SIAD (syndrome of inappropriate antidiuresis) (HCC)   Benign essential HTN   Right foot infection   Anemia    Discharge Instructions  Discharge Instructions    Call MD for:  difficulty breathing, headache or visual disturbances    Complete by:  As directed    Call MD for:  extreme fatigue    Complete by:  As directed    Call MD for:  hives    Complete by:  As directed    Call MD for:  persistant dizziness or light-headedness    Complete by:  As directed    Call MD for:  persistant nausea and vomiting    Complete by:  As directed    Call MD for:  severe uncontrolled pain    Complete by:  As directed    Call MD for:  temperature >100.4    Complete by:  As directed    Diet - low sodium heart healthy    Complete by:  As directed    Increase activity slowly    Complete by:  As directed      Allergies as of 07/29/2016      Reactions   No Known Allergies       Medication List    STOP taking these medications   ciprofloxacin 500 MG tablet Commonly known as:  CIPRO   doxycycline 100 MG capsule Commonly known as:  VIBRAMYCIN     TAKE these medications   amLODipine 10 MG tablet Commonly known as:  NORVASC Take 1 tablet (  10 mg total) by mouth daily.   aspirin 81 MG EC tablet Take 1 tablet (81 mg total) by mouth daily. Start taking on:  07/30/2016   cholecalciferol 1000 units tablet Commonly known as:  VITAMIN D Take 1,000 Units by mouth 2 (two) times daily.   clonazePAM 0.5 MG tablet Commonly known as:  KLONOPIN Take 0.5 tablets (0.25 mg total) by mouth every 8 (eight) hours as needed for anxiety.   clopidogrel 75 MG tablet Commonly known as:  PLAVIX Take 1 tablet (75 mg total) by mouth daily. Start taking on:  07/30/2016   ibuprofen 800 MG tablet Commonly known  as:  ADVIL,MOTRIN Take 1 tablet (800 mg total) by mouth every 8 (eight) hours as needed. What changed:  reasons to take this   magnesium oxide 400 MG tablet Commonly known as:  MAG-OX Take 1 tablet (400 mg total) by mouth daily.   multivitamin with minerals Tabs tablet Take 1 tablet by mouth daily.   nicotine 21 mg/24hr patch Commonly known as:  NICODERM CQ - dosed in mg/24 hours Place 21 mg onto the skin daily.   oxyCODONE-acetaminophen 5-325 MG tablet Commonly known as:  PERCOCET/ROXICET Take 1 tablet by mouth every 8 (eight) hours as needed for moderate pain.   polyethylene glycol packet Commonly known as:  MIRALAX / GLYCOLAX Take 17 g by mouth 2 (two) times daily.   saccharomyces boulardii 250 MG capsule Commonly known as:  FLORASTOR Take 1 capsule (250 mg total) by mouth 2 (two) times daily.   senna-docusate 8.6-50 MG tablet Commonly known as:  Senokot-S Take 1 tablet by mouth at bedtime.   sodium chloride 1 g tablet Take 1 tablet (1 g total) by mouth 2 (two) times daily with a meal.   thiamine 100 MG tablet Take 1 tablet (100 mg total) by mouth daily.       Contact information for follow-up providers    Servando Snare, MD Follow up in 4 week(s).   Specialties:  Vascular Surgery, Cardiology Why:  office will call Contact information: [place] [address] [phone]            Contact information for after-discharge care    [facility] .   Specialty:  [place] information: [address] [address] [phone]                 Allergies  Allergen Reactions  . No Known Allergies     Consultations:  Orthopedics  Vascular surgery   Procedures/Studies: 04/13 Abdominal aortogram with right lower extremity runoff right superficial femoral popliteal stent (6 x 100 mm Abbott 2)  04/17  Right below knee  amputation  Subjective: Patient was seen and examined at bedside. Denies headache, dizziness, nausea, vomiting, chest pressure shortness of breath. Pain controlled with current oral regimen.  Discharge Exam: Vitals:   07/29/16 1245 07/29/16 1338  BP: (!) 146/77 (!) 144/78  Pulse: 87 90  Resp: 16 18  Temp: 98 F (36.7 C) 98 F (36.7 C)   Vitals:   07/29/16 1113 07/29/16 1145 07/29/16 1245 07/29/16 1338  BP: (!) 149/72 (!) 148/72 (!) 146/77 (!) 144/78  Pulse: 92 88 87 90  Resp: 14 16 16 18   Temp: 98.3 F (36.8 C) 98.9 F (37.2 C) 98 F (36.7 C) 98 F (36.7 C)  TempSrc: Oral Oral Oral Oral  SpO2: 98% 99% 98% 99%  Weight:  Height:        General: Pt is alert, awake, not in acute distress Cardiovascular: RRR, S1/S2 +, no rubs, no gallops Respiratory: CTA bilaterally, no wheezing, no rhonchi Abdominal: Soft, NT, ND, bowel sounds + Extremities: right BKA with dressing applied    The results of significant diagnostics from this hospitalization (including imaging, microbiology, ancillary and laboratory) are listed below for reference.     Microbiology: Recent Results (from the past 240 hour(s))  MRSA PCR Screening     Status: None   Collection Time: 07/20/16  6:01 PM  Result Value Ref Range Status   MRSA by PCR NEGATIVE NEGATIVE Final    Comment:        The GeneXpert MRSA Assay (FDA approved for NASAL specimens only), is one component of a comprehensive MRSA colonization surveillance program. It is not intended to diagnose MRSA infection nor to guide or monitor treatment for MRSA infections.   Surgical pcr screen     Status: None   Collection Time: 07/23/16  3:58 AM  Result Value Ref Range Status   MRSA, PCR NEGATIVE NEGATIVE Final   Staphylococcus aureus NEGATIVE NEGATIVE Final    Comment:        The Xpert SA Assay (FDA approved for NASAL specimens in patients over 77 years of age), is one component of a comprehensive surveillance program.  Test  performance has been validated by [facility] for patients greater than or equal to 79 year old. It is not intended to diagnose infection nor to guide or monitor treatment.      Labs: BNP (last 3 results) No results for input(s): BNP in the last 8760 hours. Basic Metabolic Panel:  Recent Labs Lab 07/23/16 0540 07/24/16 0325 07/26/16 1121 07/27/16 0210 07/28/16 0221 07/29/16 0200  NA 132* 130* 129*  --  131* 130*  K 4.0 4.0 3.9  --  4.1 3.6  CL 97* 95* 97*  --  97* 97*  CO2 26 24 22   --  28 25  GLUCOSE 108* 109* 132*  --  117* 106*  BUN <5* 6 6  --  7 <5*  CREATININE 0.62 0.67 0.66 0.68 0.67 0.63  CALCIUM 9.2 9.3 8.6*  --  8.6* 8.2*   Liver Function Tests:  Recent Labs Lab 07/28/16 0221  AST 34  ALT 27  ALKPHOS 44  BILITOT 0.6  PROT 6.6  ALBUMIN 2.5*   No results for input(s): LIPASE, AMYLASE in the last 168 hours. No results for input(s): AMMONIA in the last 168 hours. CBC:  Recent Labs Lab 07/23/16 0540 07/24/16 0325 07/28/16 0221 07/28/16 1831 07/29/16 0200 07/29/16 1413  WBC 7.1 9.6 9.1  --  9.9  --   HGB 10.4* 9.8* 6.8* 7.3* 7.0* 8.9*  HCT 30.5* 28.5* 20.3* 21.2* 20.7* 26.6*  MCV 87.4 86.9 86.4  --  85.9  --   PLT 644* 622* 518*  --  462*  --    Cardiac Enzymes: No results for input(s): CKTOTAL, CKMB, CKMBINDEX, TROPONINI in the last 168 hours. BNP: Invalid input(s): POCBNP CBG:  Recent Labs Lab 07/27/16 1710 07/27/16 2039  GLUCAP 149* 160*   D-Dimer No results for input(s): DDIMER in the last 72 hours. Hgb A1c No results for input(s): HGBA1C in the last 72 hours. Lipid Profile No results for input(s): CHOL, HDL, LDLCALC, TRIG, CHOLHDL, LDLDIRECT in the last 72 hours. Thyroid function studies No results for input(s): TSH, T4TOTAL, T3FREE, THYROIDAB in the last 72 hours.  Invalid input(s): FREET3  Anemia work up No results for input(s): VITAMINB12, FOLATE, FERRITIN, TIBC, IRON, RETICCTPCT in the last 72 hours. Urinalysis     Component Value Date/Time   COLORURINE STRAW (A) 07/10/2016 1803   APPEARANCEUR CLEAR 07/10/2016 1803   LABSPEC 1.008 07/10/2016 1803   PHURINE 5.0 07/10/2016 1803   GLUCOSEU NEGATIVE 07/10/2016 1803   HGBUR SMALL (A) 07/10/2016 1803   BILIRUBINUR NEGATIVE 07/10/2016 1803   KETONESUR NEGATIVE 07/10/2016 1803   PROTEINUR NEGATIVE 07/10/2016 1803   UROBILINOGEN 1.0 10/09/2010 1111   NITRITE NEGATIVE 07/10/2016 1803   LEUKOCYTESUR NEGATIVE 07/10/2016 1803   Sepsis Labs Invalid input(s): PROCALCITONIN,  WBC,  LACTICIDVEN Microbiology Recent Results (from the past 240 hour(s))  MRSA PCR Screening     Status: None   Collection Time: 07/20/16  6:01 PM  Result Value Ref Range Status   MRSA by PCR NEGATIVE NEGATIVE Final    Comment:        The GeneXpert MRSA Assay (FDA approved for NASAL specimens only), is one component of a comprehensive MRSA colonization surveillance program. It is not intended to diagnose MRSA infection nor to guide or monitor treatment for MRSA infections.   Surgical pcr screen     Status: None   Collection Time: 07/23/16  3:58 AM  Result Value Ref Range Status   MRSA, PCR NEGATIVE NEGATIVE Final   Staphylococcus aureus NEGATIVE NEGATIVE Final    Comment:        The Xpert SA Assay (FDA approved for NASAL specimens in patients over 5 years of age), is one component of a comprehensive surveillance program.  Test performance has been validated by [facility] for patients greater than or equal to 27 year old. It is not intended to diagnose infection nor to guide or monitor treatment.      Time coordinating discharge: 31 minutes  SIGNED:   Rosita Fire, MD  [facility] 07/29/2016, 3:44 PM  If 7PM-7AM, please contact night-coverage [email or web address] Password [email or web address]

## 2016-07-29 NOTE — Progress Notes (Signed)
Pt d/c via [facility] to [facility]

## 2016-07-29 NOTE — Progress Notes (Addendum)
[facility]  Subjective  - Doing well, slept well   Objective 136/66 94 98.4 F (36.9 C) (Oral) 18 100%  Intake/Output Summary (Last 24 hours) at 07/29/16 0726 Last data filed at 07/29/16 0656  Gross per 24 hour  Intake          2601.67 ml  Output             2200 ml  Net           401.67 ml    Right residual limb incision healing well   Assessment/Planning: POD # Right BKA  HGB 7.0 ordered 2 units PRBC I will stop IV antibiotics since he has had an amputation at this point and no longer needs them.  WBC 9.9 and afebrile     Laurence Slate [facility] 07/29/2016 7:26 AM --  Laboratory Lab Results:  Recent Labs  07/28/16 0221 07/28/16 1831 07/29/16 0200  WBC 9.1  --  9.9  HGB 6.8* 7.3* 7.0*  HCT 20.3* 21.2* 20.7*  PLT 518*  --  462*   BMET  Recent Labs  07/28/16 0221 07/29/16 0200  NA 131* 130*  K 4.1 3.6  CL 97* 97*  CO2 28 25  GLUCOSE 117* 106*  BUN 7 <5*  CREATININE 0.67 0.63  CALCIUM 8.6* 8.2*    COAG Lab Results  Component Value Date   INR 1.14 07/23/2016   INR 1.0 02/06/2007   No results found for: PTT  I have independently interviewed patient and agree with PA assessment and plan above. Transfusing 2 units prbc's and stopped abx as infection has been removed with amputation. [place] for discharge from vascular standpoint.   April Colter C. Donzetta Matters, MD [facility] Office: [phone] Pager: [phone]

## 2016-07-29 NOTE — Progress Notes (Signed)
Physical Therapy Treatment Patient Details Name: Calvin Mckee MRN: [id number] DOB: 1950/06/15 Today's Date: 07/29/2016    History of Present Illness Pt is a 66 y.o. male s/p R BKA. PMHx: HTN, ETOH use, Cocaine and tobacco abuse.     PT Comments    Pt pleasant but confused and able to ambulate today. Pt educated for HEP but has difficulty with following verbal and tactile cues for sequence and safety. Pt limited by pain in RLE and at times unaware of where limb is but states no phantom pain. Pt educated for positioning and safety. Will continue to follow. Pt states he has no intention of returning home as he is alone and can't afford it.   Follow Up Recommendations  SNF;Supervision/Assistance - 24 hour     Equipment Recommendations       Recommendations for Other Services       Precautions / Restrictions Precautions Precautions: Fall Restrictions Weight Bearing Restrictions: No RLE Weight Bearing: Non weight bearing    Mobility  Bed Mobility   Bed Mobility: Supine to Sit;Sit to Supine     Supine to sit: Min guard Sit to supine: Min guard   General bed mobility comments: guarding for lines and safety, increased time for transfers with reliance on rail   Transfers Overall transfer level: Needs assistance   Transfers: Sit to/from Stand Sit to Stand: Min guard         General transfer comment: cues for hand placement and safety without physical assist to rise, increased time to achieve fully upright  Ambulation/Gait Ambulation/Gait assistance: Min assist Ambulation Distance (Feet): 22 Feet Assistive device: Rolling walker (2 wheeled) Gait Pattern/deviations: Step-to pattern;Trunk flexed   Gait velocity interpretation: Below normal speed for age/gender General Gait Details: cues for posture, position in RW, increased step length and encouraged shoe for left foot to decrease impact. Cues to extend right hip to neutral in standing   Stairs             Wheelchair Mobility    Modified Rankin (Stroke Patients Only)       Balance Overall balance assessment: Needs assistance   Sitting balance-Leahy Scale: Fair       Standing balance-Leahy Scale: Poor                              Cognition Arousal/Alertness: Awake/alert Behavior During Therapy: Flat affect Overall Cognitive Status: Impaired/Different from baseline Area of Impairment: Memory;Following commands;Safety/judgement                     Memory: Decreased short-term memory Following Commands: Follows one step commands consistently Safety/Judgement: Decreased awareness of safety;Decreased awareness of deficits   Problem Solving: Slow processing;Decreased initiation General Comments: pt with difficulty with commands for HEP and unaware he was not performing demonstrated task      Exercises Amputee Exercises Quad Sets: AROM;Right;10 reps;Supine;Strengthening (very weak with limited contraction) Hip Extension: AAROM;Sidelying;Right;10 reps Hip ABduction/ADduction: AAROM;Right;10 reps;Supine    General Comments        Pertinent Vitals/Pain Pain Score: 6  Pain Location: RLE Pain Descriptors / Indicators: Aching Pain Intervention(s): Limited activity within patient's tolerance;Repositioned;Patient requesting pain meds-RN notified;RN gave pain meds during session;Monitored during session    Home Living                      Prior Function  PT Goals (current goals can now be found in the care plan section) Progress towards PT goals: Progressing toward goals    Frequency    Min 3X/week      PT Plan Current plan remains appropriate    Co-evaluation             End of Session Equipment Utilized During Treatment: Gait belt Activity Tolerance: Patient tolerated treatment well Patient left: in bed;with call bell/phone within reach;with nursing/sitter in room;with bed alarm set Nurse Communication: Mobility  status PT Visit Diagnosis: Unsteadiness on feet (R26.81);Other abnormalities of gait and mobility (R26.89);Muscle weakness (generalized) (M62.81);Pain Pain - Right/Left: Right Pain - part of body: Leg     Time: 3664-4034 PT Time Calculation (min) (ACUTE ONLY): 31 min  Charges:  $Gait Training: 8-22 mins $Therapeutic Exercise: 8-22 mins                    G Codes:       Elwyn Reach, PT [phone]  Amiley Shishido B Refugio Vandevoorde 07/29/2016, 11:26 AM

## 2016-07-29 NOTE — Care Management Note (Signed)
Case Management Note Marvetta Gibbons RN, BSN Unit 2W-Case Manager [phone]  Patient Details  Name: Calvin Mckee MRN: [id number] Date of Birth: 07-01-1950  Subjective/Objective:   Pt admitted with non healing right surgical wound-  Critical leg ischemia- plan for OR on 4/17 for either BKA or TMA               Action/Plan: PTA pt was at SNF- [facility]- PT/OT evals ordered post op 4/17- pt will most likely need to return to SNF- CSW following  Expected Discharge Date:  07/29/16               Expected Discharge Plan:  [place] Referral:  Clinical Social Work  Discharge planning Services  CM Consult  Post Acute Care Choice:    Choice offered to:     DME Arranged:    DME Agency:     HH Arranged:    [place] Agency:     Status of Service:  Completed, signed off  If discussed at [employer] of Stay Meetings, dates discussed:  4/17, 4/19  Discharge Disposition: skilled facility   Additional Comments:  07/29/16- 1430- Marvetta Gibbons RN, CM- pt for d/c today- spoke with Melissa with CIR- who offered pt a CIR bed- however pt wants to return to [facility] aware and working to d/c pt back to [facility] today.   07/27/16- 1430- Drishti Pepperman RN, CM- pt s/p BKA today- CIR consulted post amputation- CSW also following - as pt was from SNF- prior to admission.   Dawayne Patricia, RN 07/29/2016, 2:35 PM

## 2016-07-29 NOTE — Clinical Social Work Note (Signed)
Pt is ready for discharge today and will return to [facility]. Pt is aware and agreeable to discharge plan. CSW sent clinicals to [facility] and communicated with [facility] (admissions) for room and report. Room and report put in treatment team sticky note and RN aware. Transportation arranged with [facility]. CSW is signing off as no further needs identified.  Oretha Ellis, Latanya Presser, [place] Social Worker  [phone]

## 2016-07-29 NOTE — Progress Notes (Addendum)
Inpatient Rehabilitation  Met with patient to discuss Dr. Letta Pate' recommendations for IP Rehab for pain management and medical stability prior to return to SNF, where patient was PTA.  Patient stated that his pain was not bed and that he preferred to return back to SNF, [facility].  Additionally, I do not anticipate having a bed available today.  Will sign off at this time.  Please call with questions.   Carmelia Roller., CCC/SLP Admission Coordinator  [place]  Cell [phone]

## 2016-07-30 ENCOUNTER — Non-Acute Institutional Stay (SKILLED_NURSING_FACILITY): Payer: Medicare Other | Admitting: Adult Health

## 2016-07-30 ENCOUNTER — Encounter: Payer: Self-pay | Admitting: Adult Health

## 2016-07-30 ENCOUNTER — Other Ambulatory Visit: Payer: Self-pay

## 2016-07-30 DIAGNOSIS — M86071 Acute hematogenous osteomyelitis, right ankle and foot: Secondary | ICD-10-CM

## 2016-07-30 DIAGNOSIS — K5903 Drug induced constipation: Secondary | ICD-10-CM | POA: Insufficient documentation

## 2016-07-30 DIAGNOSIS — Z89511 Acquired absence of right leg below knee: Secondary | ICD-10-CM | POA: Insufficient documentation

## 2016-07-30 DIAGNOSIS — E222 Syndrome of inappropriate secretion of antidiuretic hormone: Secondary | ICD-10-CM | POA: Diagnosis not present

## 2016-07-30 DIAGNOSIS — F1910 Other psychoactive substance abuse, uncomplicated: Secondary | ICD-10-CM | POA: Diagnosis not present

## 2016-07-30 DIAGNOSIS — I10 Essential (primary) hypertension: Secondary | ICD-10-CM | POA: Diagnosis not present

## 2016-07-30 DIAGNOSIS — F17200 Nicotine dependence, unspecified, uncomplicated: Secondary | ICD-10-CM | POA: Diagnosis not present

## 2016-07-30 DIAGNOSIS — T402X5A Adverse effect of other opioids, initial encounter: Secondary | ICD-10-CM | POA: Insufficient documentation

## 2016-07-30 LAB — BPAM RBC
Blood Product Expiration Date: 201805132359
Blood Product Expiration Date: 201805152359
Blood Product Expiration Date: 201805152359
ISSUE DATE / TIME: 201804181328
ISSUE DATE / TIME: 201804190830
ISSUE DATE / TIME: 201804191122
Unit Type and Rh: 7300
Unit Type and Rh: 7300
Unit Type and Rh: 7300

## 2016-07-30 LAB — TYPE AND SCREEN
ABO/RH(D): B POS
Antibody Screen: NEGATIVE
Unit division: 0
Unit division: 0
Unit division: 0

## 2016-07-30 MED ORDER — OXYCODONE-ACETAMINOPHEN 5-325 MG PO TABS: 1.0000 | ORAL_TABLET | Freq: Three times a day (TID) | ORAL | 0 refills | Status: DC | PRN

## 2016-07-30 MED ORDER — CLONAZEPAM 0.5 MG PO TABS: 0.2500 mg | ORAL_TABLET | Freq: Three times a day (TID) | ORAL | 5 refills | Status: DC | PRN

## 2016-07-30 NOTE — Telephone Encounter (Signed)
RX faxed to [facility] @ [phone], phone number [phone] 

## 2016-07-30 NOTE — Progress Notes (Signed)
Location:   [facility] Room Number: 126 A Place of Service:  SNF (31)   CODE STATUS: Full Code  Allergies  Allergen Reactions  . No Known Allergies     Chief Complaint  Patient presents with  . 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 He did undergo angioplasty and stenting on the right superficial femoral artery on 07-23-16;and underwent a right bka on 07-27-16. He is here for short term rehab with his goal to return back home. He is complaining of right stump pain. '   Past Medical History:  Diagnosis Date  . Arthritis   . Hypertension     Past Surgical History:  Procedure Laterality Date  . ABDOMINAL AORTOGRAM N/A 07/23/2016   Procedure: Abdominal Aortogram;  Surgeon: Elam Dutch, MD;  Location: [facility];  Service: Cardiovascular;  Laterality: N/A;  . AMPUTATION Right 07/27/2016   Procedure: RIGHT BELOW KNEE AMPUTATION;  Surgeon: Waynetta Sandy, MD;  Location: [facility];  Service: Vascular;  Laterality: Right;  . CERVICAL FUSION    . LOWER EXTREMITY ANGIOGRAPHY Right 07/23/2016   Procedure: Lower Extremity Angiography;  Surgeon: Elam Dutch, MD;  Location: [facility];  Service: Cardiovascular;  Laterality: Right;  . PERIPHERAL VASCULAR INTERVENTION Right 07/23/2016   Procedure: Peripheral Vascular Intervention;  Surgeon: Elam Dutch, MD;  Location: [facility];  Service: Cardiovascular;  Laterality: Right;  SFA     Social History   Social History  . Marital status: Single    Spouse name: N/A  . Number of children: N/A  . Years of education: N/A   Occupational History  . Not on file.   Social History Main Topics  . Smoking status: Current Every Day Smoker    Packs/day: 0.50    Types: Cigarettes  . Smokeless tobacco: Never Used  . Alcohol use Yes  . Drug use: Yes    Types: "Crack" cocaine, Other-see comments     Comment:  percocet   . Sexual activity: No   Other Topics Concern  . Not on file   Social History Narrative  . No narrative on file   History reviewed. No pertinent family history.    VITAL SIGNS BP (!) 142/78   Pulse 88   Temp 98.2 F (36.8 C)   Resp 18   Ht 5\' 8"  (1.727 m)   Wt 163 lb (73.9 kg)   SpO2 98%   BMI 24.78 kg/m   Patient's Medications  New Prescriptions   No medications on file  Previous Medications   AMLODIPINE (NORVASC) 10 MG TABLET    Take 1 tablet (10 mg total) by mouth daily.   ASPIRIN EC 81 MG EC TABLET    Take 1 tablet (81 mg total) by mouth daily.   CHOLECALCIFEROL (VITAMIN D) 1000 UNITS TABLET    Take 1,000 Units by mouth 2 (two) times daily.   CLONAZEPAM (KLONOPIN) 0.5 MG TABLET    Take 0.5 tablets (0.25 mg total) by mouth every 8 (eight) hours as needed for anxiety.   CLOPIDOGREL (PLAVIX) 75 MG TABLET    Take 1 tablet (75 mg total) by mouth daily.   IBUPROFEN (ADVIL,MOTRIN) 800 MG TABLET    Take 800 mg by mouth every 8 (eight) hours as needed.   MAGNESIUM OXIDE (MAG-OX) 400 MG TABLET    Take 1 tablet (400 mg total) by  mouth daily.   MULTIPLE VITAMIN (MULTIVITAMIN WITH MINERALS) TABS TABLET    Take 1 tablet by mouth daily.   NICOTINE (NICODERM CQ - DOSED IN MG/24 HOURS) 21 MG/24HR PATCH    Place 21 mg onto the skin daily.   OXYCODONE-ACETAMINOPHEN (PERCOCET/ROXICET) 5-325 MG TABLET    Take 1 tablet by mouth every 8 (eight) hours as needed for moderate pain.   POLYETHYLENE GLYCOL (MIRALAX / GLYCOLAX) PACKET    Take 17 g by mouth 2 (two) times daily.   SACCHAROMYCES BOULARDII (FLORASTOR) 250 MG CAPSULE    Take 1 capsule (250 mg total) by mouth 2 (two) times daily.   SENNA-DOCUSATE (SENOKOT-S) 8.6-50 MG TABLET    Take 1 tablet by mouth at bedtime.   SODIUM CHLORIDE 1 G TABLET    Take 1 tablet (1 g total) by mouth 2 (two) times daily with a meal.   THIAMINE 100 MG TABLET    Take 1 tablet (100 mg total) by mouth daily.  Modified Medications   No medications on  file  Discontinued Medications   IBUPROFEN (ADVIL,MOTRIN) 800 MG TABLET    Take 1 tablet (800 mg total) by mouth every 8 (eight) hours as needed.     SIGNIFICANT DIAGNOSTIC EXAMS  07-14-16: chest x-ray: Minimal atelectasis at the right base. No acute infiltrate or edema.  07-15-16: MRI right foot: Markedly compromised study due to patient motion artifacts. Marrow signal abnormalities at the amputation site of the base of the second proximal phalanx with surrounding soft tissue inflammation/edema raise concern for the presence of osteomyelitis at the stump.   LABS REVIEWED:   07-11-16: hgb a1c 6.2; tsh 0.615 07-15-16: uric acid 2.8 07-20-16: wbc 8.1; hgb 11.;2 hct 31.5; mcv 84.9; plt 643; glucose 89; bun 8; creat 0.72 ;k+ 4.0; na++ 133 07-23-16: wbc 7.1; hgb 10.4; hct 30.5; mcv 87.4; plt 644; glucose 108; bun <5; creat 062; k+ 4.0; na++ 132 07-28-16: wbc 9.1; hgb 6.8; hct 20.3; mcv 86.;4 plt 518; glucose 117; bun 7; creat 0.67; k+ 4.1; na++ 131; liver normal albumin 2.5 07-29-16: wbc 9.9; hgb 7.0; hct 20.7; mcv 85.9; plt 462   Review of Systems  Constitutional: Negative for malaise/fatigue.  Respiratory: Negative for cough and shortness of breath.   Cardiovascular: Negative for chest pain, palpitations and leg swelling.  Gastrointestinal: Negative for abdominal pain, constipation and heartburn.  Musculoskeletal: Positive for myalgias. Negative for back pain and joint pain.       Has right stump pain   Skin: Negative.   Neurological: Negative for dizziness.  Psychiatric/Behavioral: The patient is not nervous/anxious.     Physical Exam  Constitutional: No distress.  Eyes: Conjunctivae are normal.  Neck: Neck supple. No JVD present. No thyromegaly present.  Cardiovascular: Normal rate, regular rhythm and intact distal pulses.   Respiratory: Effort normal and breath sounds normal. No respiratory distress. He has no wheezes.  GI: Soft. Bowel sounds are normal. He exhibits no distension. There  is no tenderness.  Musculoskeletal: He exhibits no edema.  Able to move all extremities  Is status post right stump fracture  Lymphadenopathy:    He has no cervical adenopathy.  Neurological: He is alert.  Skin: Skin is warm and dry. He is not diaphoretic.  Dressing intact to right stump   Psychiatric: He has a normal mood and affect.     ASSESSMENT/ PLAN:  1. 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 Constipation: will continue miralax twice daily   4. Smoker: he is currently using a nicotine patch 21 mg daily   5. Polysubstance abuse: will continue MVI; mag ox 400 mg daily thiamine 100 mg daily has klonopin 0.25 mg every 8 hours as needed for anxiety  6. osteomyelitis: right foot: is status post right bka: has completed abt; he is having pain which is not being adequately managed; will change his percocet 5/325 mg every 4 hours as needed for one week then every 6 hours as needed for one week; then return to every 8 hours as needed. Will continue therapy as directed and will follow up with vascular surgery in 4 weeks.    Will check cbc; bmp    Time spent with patient 50    minutes >50% time spent counseling; reviewing medical record; tests; labs; and developing future plan of care   MD is aware of resident's narcotic use and is in agreement with current plan of care. We will attempt to wean resident as apropriate   Ok Edwards NP [facility]  Contact [phone] Monday through Friday 8am- 5pm  After hours call [phone]

## 2016-08-02 LAB — BASIC METABOLIC PANEL
BUN: 7 mg/dL (ref 4–21)
Creatinine: 0.6 mg/dL (ref 0.6–1.3)
GLUCOSE: 118 mg/dL
POTASSIUM: 4.5 mmol/L (ref 3.4–5.3)
SODIUM: 139 mmol/L (ref 137–147)

## 2016-08-02 LAB — CBC AND DIFFERENTIAL
HEMATOCRIT: 31 % — AB (ref 41–53)
HEMOGLOBIN: 10.1 g/dL — AB (ref 13.5–17.5)
NEUTROS ABS: 5 /uL
Platelets: 600 10*3/uL — AB (ref 150–399)
WBC: 7.4 10^3/mL

## 2016-08-05 ENCOUNTER — Non-Acute Institutional Stay (SKILLED_NURSING_FACILITY): Payer: Medicare Other | Admitting: Internal Medicine

## 2016-08-05 ENCOUNTER — Encounter: Payer: Self-pay | Admitting: Internal Medicine

## 2016-08-05 DIAGNOSIS — I10 Essential (primary) hypertension: Secondary | ICD-10-CM

## 2016-08-05 DIAGNOSIS — E222 Syndrome of inappropriate secretion of antidiuretic hormone: Secondary | ICD-10-CM | POA: Diagnosis not present

## 2016-08-05 DIAGNOSIS — D649 Anemia, unspecified: Secondary | ICD-10-CM | POA: Diagnosis not present

## 2016-08-05 DIAGNOSIS — F1910 Other psychoactive substance abuse, uncomplicated: Secondary | ICD-10-CM

## 2016-08-05 DIAGNOSIS — Z89511 Acquired absence of right leg below knee: Secondary | ICD-10-CM

## 2016-08-05 NOTE — Progress Notes (Signed)
Provider:  Virgie Dad. MD Location:  [facility] Room Number: [place] of Service:  SNF (31)  PCP: Pcp Not In System Patient Care Team: Pcp Not In System as PCP - General  Extended Emergency Contact Information Primary Emergency Contact: Caisen, Mangas Address: [address]          [place], [place] [address] [place] Phone: [phone] Relation: Mother  Code Status: Full Code Goals of Care: Advanced Directive information Advanced Directives 08/05/2016  Does Patient Have a Medical Advance Directive? No  Would patient like information on creating a medical advance directive? No - Patient declined  Some encounter information is confidential and restricted. Go to Review Flowsheets activity to see all data.      Chief Complaint  Patient presents with  . Readmit To SNF    Readmission    HPI: Patient is a 66 y.o. male seen today for Readmission to SNF for therapy.  Patient has h/o HTN, Alcohol abuse, Drug abuse had Right Great Toe Amputation in 06/15/16 developed worsening of his wound on 04/05 and was diagnosed with Osteomyelitis. He was initially discharged to SNF with Cipro and Doxy .   But was readmitted ot hospital with Pain and worsening of his wound. He underwent Stenting of the Right Femoral Artery on 04/13 and BKA on 04/17. He was discharged to SNF for further Therapy. His post op course was complicated by Anemia requiring transfusion. Patient is doing well in facility. Is able to transfer from wheel chair to bed. His only complain is phantom Pain in his leg. He says he has been on 10 mg of Percocet for many years and 5 mg is not helping him. His wound per nurses is healing well and incision looks good. His plan is to go to his apartment eventually. Does not have much family here which can help.  Past Medical History:  Diagnosis Date  . Arthritis   . Hypertension    Past Surgical History:  Procedure  Laterality Date  . ABDOMINAL AORTOGRAM N/A 07/23/2016   Procedure: Abdominal Aortogram;  Surgeon: Elam Dutch, MD;  Location: [facility];  Service: Cardiovascular;  Laterality: N/A;  . AMPUTATION Right 07/27/2016   Procedure: RIGHT BELOW KNEE AMPUTATION;  Surgeon: Waynetta Sandy, MD;  Location: [facility];  Service: Vascular;  Laterality: Right;  . CERVICAL FUSION    . LOWER EXTREMITY ANGIOGRAPHY Right 07/23/2016   Procedure: Lower Extremity Angiography;  Surgeon: Elam Dutch, MD;  Location: [facility];  Service: Cardiovascular;  Laterality: Right;  . PERIPHERAL VASCULAR INTERVENTION Right 07/23/2016   Procedure: Peripheral Vascular Intervention;  Surgeon: Elam Dutch, MD;  Location: [facility];  Service: Cardiovascular;  Laterality: Right;  SFA     reports that he has been smoking Cigarettes.  He has been smoking about 0.50 packs per day. He has never used smokeless tobacco. He reports that he drinks alcohol. He reports that he uses drugs, including "Crack" cocaine and Other-see comments. Social History   Social History  . Marital status: Single    Spouse name: N/A  . Number of children: N/A  . Years of education: N/A   Occupational History  . Not on file.   Social History Main Topics  . Smoking status: Current Every Day Smoker    Packs/day: 0.50    Types: Cigarettes  . Smokeless tobacco: Never Used  . Alcohol use Yes  .  Drug use: Yes    Types: "Crack" cocaine, Other-see comments     Comment: percocet   . Sexual activity: No   Other Topics Concern  . Not on file   Social History Narrative  . No narrative on file    Functional Status Survey:    History reviewed. No pertinent family history.  Health Maintenance  Topic Date Due  . COLONOSCOPY  08/01/2017 (Originally 01/16/2003)  . TETANUS/TDAP  08/01/2017 (Originally 01/16/1972)  . Hepatitis C Screening  08/01/2017 (Originally [birth year])  . INFLUENZA VACCINE  11/10/2016  . HIV  Screening  Completed    Allergies  Allergen Reactions  . No Known Allergies     Outpatient Encounter Prescriptions as of 08/05/2016  Medication Sig  . amLODipine (NORVASC) 10 MG tablet Take 1 tablet (10 mg total) by mouth daily.  Marland Kitchen aspirin EC 81 MG EC tablet Take 1 tablet (81 mg total) by mouth daily.  . cholecalciferol (VITAMIN D) 1000 units tablet Take 1,000 Units by mouth 2 (two) times daily.  . clonazePAM (KLONOPIN) 0.5 MG tablet Take 0.5 tablets (0.25 mg total) by mouth every 8 (eight) hours as needed for anxiety.  . clopidogrel (PLAVIX) 75 MG tablet Take 1 tablet (75 mg total) by mouth daily.  Marland Kitchen ibuprofen (ADVIL,MOTRIN) 800 MG tablet Take 800 mg by mouth every 8 (eight) hours as needed.  . magnesium oxide (MAG-OX) 400 MG tablet Take 1 tablet (400 mg total) by mouth daily.  . Multiple Vitamin (MULTIVITAMIN WITH MINERALS) TABS tablet Take 1 tablet by mouth daily.  . nicotine (NICODERM CQ - DOSED IN MG/24 HOURS) 21 mg/24hr patch Place 21 mg onto the skin daily.  Marland Kitchen oxyCODONE-acetaminophen (PERCOCET/ROXICET) 5-325 MG tablet Take 1 tablet by mouth every 6 (six) hours as needed for severe pain. Starting on 08-15-16, Give 1 tablet by mouth every 8 hours as needed for pain  . polyethylene glycol (MIRALAX / GLYCOLAX) packet Take 17 g by mouth 2 (two) times daily.  Marland Kitchen saccharomyces boulardii (FLORASTOR) 250 MG capsule Take 1 capsule (250 mg total) by mouth 2 (two) times daily.  Marland Kitchen senna-docusate (SENOKOT-S) 8.6-50 MG tablet Take 1 tablet by mouth at bedtime.  . sodium chloride 1 g tablet Take 1 g by mouth 2 (two) times daily with a meal.  . thiamine 100 MG tablet Take 1 tablet (100 mg total) by mouth daily.  . [DISCONTINUED] oxyCODONE-acetaminophen (PERCOCET/ROXICET) 5-325 MG tablet Take 1 tablet by mouth every 8 (eight) hours as needed for moderate pain. (Patient not taking: Reported on 08/05/2016)   No facility-administered encounter medications on file as of 08/05/2016.     Review of Systems    Review of Systems  Constitutional: Negative for activity change, appetite change, chills, diaphoresis, fatigue and fever.  HENT: Negative for mouth sores, postnasal drip, rhinorrhea, sinus pain and sore throat.   Respiratory: Negative for apnea, cough, chest tightness, shortness of breath and wheezing.   Cardiovascular: Negative for chest pain, palpitations and leg swelling.  Gastrointestinal: Negative for abdominal distention, abdominal pain, constipation, diarrhea, nausea and vomiting.  Genitourinary: Negative for dysuria and frequency.  Musculoskeletal: Negative for arthralgias, joint swelling and myalgias.  Skin: Negative for rash.  Neurological: Negative for dizziness, syncope, weakness, light-headedness and numbness.  Psychiatric/Behavioral: Negative for behavioral problems, confusion and sleep disturbance.     Vitals:   08/05/16 0838  BP: (!) 148/82  Pulse: 78  Resp: 20  Temp: 97.1 F (36.2 C)  TempSrc: Axillary  SpO2: 95%  Weight: 163 lb (  73.9 kg)  Height: 5\' 8"  (1.727 m)   Body mass index is 24.78 kg/m. Physical Exam  Constitutional: He is oriented to person, place, and time. He appears well-developed and well-nourished.  HENT:  Head: Normocephalic.  Mouth/Throat: Oropharynx is clear and moist.  Eyes: Pupils are equal, round, and reactive to light.  Neck: Neck supple.  Cardiovascular: Normal rate, regular rhythm and normal heart sounds.   No murmur heard. Pulmonary/Chest: Effort normal and breath sounds normal. No respiratory distress. He has no wheezes.  Abdominal: Soft. Bowel sounds are normal. He exhibits no distension. There is no tenderness. There is no rebound.  Musculoskeletal: He exhibits no edema.  S/P BKA of right side  Neurological: He is alert and oriented to person, place, and time.  No Focal deficit . Good strength in all extremities.  Skin: Skin is warm and dry.  Psychiatric: He has a normal mood and affect. His behavior is normal. Judgment and  thought content normal.    Labs reviewed: Basic Metabolic Panel:  Recent Labs  07/12/16 0450 07/13/16 0552 07/14/16 0548 07/15/16 0541  07/26/16 1121  07/28/16 0221 07/29/16 0200 08/02/16  NA 133* 129* 129* 124*  < > 129*  --  131* 130* 139  K 3.6 3.7 3.6 3.9  < > 3.9  --  4.1 3.6 4.5  CL 97* 93* 93* 91*  < > 97*  --  97* 97*  --   CO2 28 26 28 24   < > 22  --  28 25  --   GLUCOSE 107* 112* 106* 108*  < > 132*  --  117* 106*  --   BUN 9 8 7 6   < > 6  --  7 <5* 7  CREATININE 0.62 0.58*  0.64 0.64 0.61  < > 0.66  < > 0.67 0.63 0.6  CALCIUM 8.9 9.1 8.6* 8.9  < > 8.6*  --  8.6* 8.2*  --   MG 1.8 1.8 1.9 2.0  --   --   --   --   --   --   PHOS 2.6 3.6 4.0  --   --   --   --   --   --   --   < > = values in this interval not displayed. Liver Function Tests:  Recent Labs  07/13/16 0552 07/14/16 0548 07/28/16 0221  AST 23 19 34  ALT 22 21 27   ALKPHOS 63 53 44  BILITOT 0.6 0.7 0.6  PROT 7.7 7.5 6.6  ALBUMIN 3.0* 2.8* 2.5*   No results for input(s): LIPASE, AMYLASE in the last 8760 hours. No results for input(s): AMMONIA in the last 8760 hours. CBC:  Recent Labs  07/16/16 0611 07/20/16 1201  07/24/16 0325 07/28/16 0221  07/29/16 0200 07/29/16 1413 08/02/16  WBC 11.7* 8.1  < > 9.6 9.1  --  9.9  --  7.4  NEUTROABS 8.2* 5.2  --   --   --   --   --   --  5  HGB 11.4* 11.2*  < > 9.8* 6.8*  < > 7.0* 8.9* 10.1*  HCT 32.0* 31.5*  < > 28.5* 20.3*  < > 20.7* 26.6* 31*  MCV 84.4 84.9  < > 86.9 86.4  --  85.9  --   --   PLT 471* 643*  < > 622* 518*  --  462*  --  600*  < > = values in this interval not displayed. Cardiac Enzymes: No results  for input(s): CKTOTAL, CKMB, CKMBINDEX, TROPONINI in the last 8760 hours. BNP: Invalid input(s): POCBNP Lab Results  Component Value Date   HGBA1C 6.2 (H) 07/11/2016   Lab Results  Component Value Date   TSH 0.615 07/11/2016   No results found for: VITAMINB12 No results found for: FOLATE No results found for: IRON, TIBC,  FERRITIN  Imaging and Procedures obtained prior to SNF admission: No results found.  Assessment/Plan  S/P Right BKA Patients Pain is not controlled on 1 tablet. Will increase his Percocet to 2 tables Q 6 hours this week and then taper it down. I doubt we will able to taper him right now. He continues to complain of Phantom Pain.  Will follow CBC and BMP. Also Follow up with vascular Per nurses his wound is healing well.  S/P femoral stenting Continue Aspirin and Plavix Patient continues to smoke though says he is trying to quit.  SIAD (syndrome of inappropriate antidiuresis)  Sodium Chloride tablet  Anemia,  Hgb Was low after surgery. Will monitor CBC  Polysubstance abuse Patient is on Thiamine and magnesium.  Hypertension Slightly elevated today. Continue Same dose Norvasc will monitor.     Family/ staff Communication:   Labs/tests ordered:

## 2016-08-09 ENCOUNTER — Other Ambulatory Visit: Payer: Self-pay

## 2016-08-09 MED ORDER — OXYCODONE-ACETAMINOPHEN 5-325 MG PO TABS: 1.0000 | ORAL_TABLET | Freq: Three times a day (TID) | ORAL | 0 refills | Status: DC

## 2016-08-09 NOTE — Telephone Encounter (Signed)
RX faxed to [facility] @ [phone], phone number [phone] 

## 2016-08-10 DIAGNOSIS — L97419 Non-pressure chronic ulcer of right heel and midfoot with unspecified severity: Secondary | ICD-10-CM | POA: Diagnosis not present

## 2016-08-17 ENCOUNTER — Non-Acute Institutional Stay (SKILLED_NURSING_FACILITY): Payer: Medicare Other | Admitting: Adult Health

## 2016-08-17 ENCOUNTER — Encounter: Payer: Self-pay | Admitting: Adult Health

## 2016-08-17 DIAGNOSIS — M86071 Acute hematogenous osteomyelitis, right ankle and foot: Secondary | ICD-10-CM | POA: Diagnosis not present

## 2016-08-17 DIAGNOSIS — E222 Syndrome of inappropriate secretion of antidiuretic hormone: Secondary | ICD-10-CM | POA: Diagnosis not present

## 2016-08-17 DIAGNOSIS — I10 Essential (primary) hypertension: Secondary | ICD-10-CM | POA: Diagnosis not present

## 2016-08-17 DIAGNOSIS — Z89511 Acquired absence of right leg below knee: Secondary | ICD-10-CM | POA: Diagnosis not present

## 2016-08-17 NOTE — Progress Notes (Signed)
Location:   [place] Room Number: [place] of Service:  SNF (31)    CODE STATUS: Full Code  Allergies  Allergen Reactions  . No Known Allergies     Chief Complaint  Patient presents with  . Discharge Note    Discharge    HPI:  He is being discharged to home with home health for pt/ot/rn.  He will need a standard wheelchair. He will need his prescriptions written and will need to follow up with his medical provider.  He had been hospitalized for a right bka; admitted to this facility for short term rehab.     Past Medical History:  Diagnosis Date  . Arthritis   . Hypertension     Past Surgical History:  Procedure Laterality Date  . ABDOMINAL AORTOGRAM N/A 07/23/2016   Procedure: Abdominal Aortogram;  Surgeon: Elam Dutch, MD;  Location: [place] [facility];  Service: Cardiovascular;  Laterality: N/A;  . AMPUTATION Right 07/27/2016   Procedure: RIGHT BELOW KNEE AMPUTATION;  Surgeon: Waynetta Sandy, MD;  Location: [facility];  Service: Vascular;  Laterality: Right;  . CERVICAL FUSION    . LOWER EXTREMITY ANGIOGRAPHY Right 07/23/2016   Procedure: Lower Extremity Angiography;  Surgeon: Elam Dutch, MD;  Location: [place] [facility];  Service: Cardiovascular;  Laterality: Right;  . PERIPHERAL VASCULAR INTERVENTION Right 07/23/2016   Procedure: Peripheral Vascular Intervention;  Surgeon: Elam Dutch, MD;  Location: [place] [facility];  Service: Cardiovascular;  Laterality: Right;  SFA     Social History   Social History  . Marital status: Single    Spouse name: N/A  . Number of children: N/A  . Years of education: N/A   Occupational History  . Not on file.   Social History Main Topics  . Smoking status: Current Every Day Smoker    Packs/day: 0.50    Types: Cigarettes  . Smokeless tobacco: Never Used  . Alcohol use Yes  . Drug use: Yes    Types: "Crack" cocaine, Other-see comments     Comment: percocet   . Sexual activity: No    Other Topics Concern  . Not on file   Social History Narrative  . No narrative on file   History reviewed. No pertinent family history.  VITAL SIGNS BP (!) 142/85   Pulse 88   Temp 97.2 F (36.2 C)   Resp 17   Ht 5\' 8"  (1.727 m)   Wt 163 lb (73.9 kg)   SpO2 99%   BMI 24.78 kg/m   Patient's Medications  New Prescriptions   No medications on file  Previous Medications   AMLODIPINE (NORVASC) 10 MG TABLET    Take 1 tablet (10 mg total) by mouth daily.   ASPIRIN EC 81 MG EC TABLET    Take 1 tablet (81 mg total) by mouth daily.   CHOLECALCIFEROL (VITAMIN D) 1000 UNITS TABLET    Take 1,000 Units by mouth 2 (two) times daily.   CLONAZEPAM (KLONOPIN) 0.5 MG TABLET    Take 0.5 tablets (0.25 mg total) by mouth every 8 (eight) hours as needed for anxiety.   CLOPIDOGREL (PLAVIX) 75 MG TABLET    Take 1 tablet (75 mg total) by mouth daily.   IBUPROFEN (ADVIL,MOTRIN) 800 MG TABLET    Take 800 mg by mouth every 8 (eight) hours as needed.   MAGNESIUM OXIDE (MAG-OX) 400 MG TABLET    Take 1 tablet (400 mg total) by mouth daily.  MULTIPLE VITAMIN (MULTIVITAMIN WITH MINERALS) TABS TABLET    Take 1 tablet by mouth daily.   NYSTATIN (MYCOSTATIN/NYSTOP) POWDER    Apply to perineal area topically daily   OXYCODONE-ACETAMINOPHEN (PERCOCET/ROXICET) 5-325 MG TABLET    Take 1-2 tablets by mouth every 6 (six) hours as needed for severe pain.   POLYETHYLENE GLYCOL (MIRALAX / GLYCOLAX) PACKET    Take 17 g by mouth 2 (two) times daily.   SACCHAROMYCES BOULARDII (FLORASTOR) 250 MG CAPSULE    Take 250 mg by mouth 2 (two) times daily.   SENNA-DOCUSATE (SENOKOT-S) 8.6-50 MG TABLET    Take 1 tablet by mouth at bedtime.   SODIUM CHLORIDE 1 G TABLET    Take 1 g by mouth 2 (two) times daily with a meal.   THIAMINE 100 MG TABLET    Take 1 tablet (100 mg total) by mouth daily.  Modified Medications   No medications on file  Discontinued Medications   NICOTINE (NICODERM CQ - DOSED IN MG/24 HOURS) 21 MG/24HR PATCH     Place 21 mg onto the skin daily.   OXYCODONE-ACETAMINOPHEN (PERCOCET/ROXICET) 5-325 MG TABLET    Take 1 tablet by mouth every 8 (eight) hours.     SIGNIFICANT DIAGNOSTIC EXAMS  07-14-16: chest x-ray: Minimal atelectasis at the right base. No acute infiltrate or edema.  07-15-16: MRI right foot: Markedly compromised study due to patient motion artifacts. Marrow signal abnormalities at the amputation site of the base of the second proximal phalanx with surrounding soft tissue inflammation/edema raise concern for the presence of osteomyelitis at the stump.   LABS REVIEWED:   07-11-16: hgb a1c 6.2; tsh 0.615 07-15-16: uric acid 2.8 07-20-16: wbc 8.1; hgb 11.;2 hct 31.5; mcv 84.9; plt 643; glucose 89; bun 8; creat 0.72 ;k+ 4.0; na++ 133 07-23-16: wbc 7.1; hgb 10.4; hct 30.5; mcv 87.4; plt 644; glucose 108; bun <5; creat 062; k+ 4.0; na++ 132 07-28-16: wbc 9.1; hgb 6.8; hct 20.3; mcv 86.;4 plt 518; glucose 117; bun 7; creat 0.67; k+ 4.1; na++ 131; liver normal albumin 2.5 07-29-16: wbc 9.9; hgb 7.0; hct 20.7; mcv 85.9; plt 462   Review of Systems  Constitutional: Negative for malaise/fatigue.  Respiratory: Negative for cough and shortness of breath.   Cardiovascular: Negative for chest pain, palpitations and leg swelling.  Gastrointestinal: Negative for abdominal pain, constipation and heartburn.  Musculoskeletal: Positive for myalgias. Negative for back pain and joint pain.       Has right stump pain is managed    Skin: Negative.   Neurological: Negative for dizziness.  Psychiatric/Behavioral: The patient is not nervous/anxious.     Physical Exam  Constitutional: No distress.  Eyes: Conjunctivae are normal.  Neck: Neck supple. No JVD present. No thyromegaly present.  Cardiovascular: Normal rate, regular rhythm and intact distal pulses.   Respiratory: Effort normal and breath sounds normal. No respiratory distress. He has no wheezes.  GI: Soft. Bowel sounds are normal. He exhibits no  distension. There is no tenderness.  Musculoskeletal: He exhibits no edema.  Able to move all extremities  Is status post right stump fracture  Lymphadenopathy:    He has no cervical adenopathy.  Neurological: He is alert.  Skin: Skin is warm and dry. He is not diaphoretic.   Psychiatric: He has a normal mood and affect.     ASSESSMENT/ PLAN:  Patient is being discharged with the following home health services:  Pt/ot/rn: to evaluate and treat as indicated for gait balance strength adl training and  medication management   Patient is being discharged with the following durable medical equipment:  Standard wheelchair with cushion amputee pad; anti-tippers; brake extensions to allow him to maintain his current level of independence which cannot be achieved with a walker. He can self propel.   Patient has been advised to f/u with their PCP in 1-2 weeks to bring them up to date on their rehab stay.  Social services at facility was responsible for arranging this appointment.  Pt was provided with a 30 day supply of prescriptions for medications and refills must be obtained from their PCP.  For controlled substances, a more limited supply may be provided adequate until PCP appointment only. #10 klonopin 0.5 mg tabs #30 percocet 5/325 mg tabs    Time spent with patient  45   minutes >50% time spent counseling; reviewing medical record; tests; labs; and developing future plan of care   Ok Edwards NP [facility]  Contact [phone] Monday through Friday 8am- 5pm  After hours call [phone]

## 2016-08-23 ENCOUNTER — Telehealth (INDEPENDENT_AMBULATORY_CARE_PROVIDER_SITE_OTHER): Payer: Self-pay | Admitting: *Deleted

## 2016-08-23 ENCOUNTER — Encounter: Payer: Self-pay | Admitting: Vascular Surgery

## 2016-08-23 NOTE — Telephone Encounter (Signed)
See message below °

## 2016-08-23 NOTE — Telephone Encounter (Signed)
Route to duda

## 2016-08-23 NOTE — Telephone Encounter (Signed)
Received call from [place] with [facility] needing VO for 1 week/1, 2week/3 and 1week/1 and 2prns BKA, Dry dressing, ace wrap, hydration soap and water and assist with mgmt of medications.  Please call [phone]

## 2016-08-25 NOTE — Telephone Encounter (Signed)
Calvin Mckee that Dr. Sharol Given did not do patients surgery, to call Dr. Claretha Cooper office, vascular surgery, because this who the patient will be following up with.

## 2016-08-27 ENCOUNTER — Encounter: Payer: Self-pay | Admitting: Vascular Surgery

## 2016-08-27 ENCOUNTER — Ambulatory Visit (INDEPENDENT_AMBULATORY_CARE_PROVIDER_SITE_OTHER): Payer: Self-pay | Admitting: Vascular Surgery

## 2016-08-27 VITALS — BP 132/82 | HR 99 | Temp 99.0°F | Resp 18 | Ht <= 58 in | Wt 156.0 lb

## 2016-08-27 DIAGNOSIS — I739 Peripheral vascular disease, unspecified: Secondary | ICD-10-CM

## 2016-08-27 NOTE — Progress Notes (Signed)
Subjective:     Patient ID: ASHWATH LASCH, male   DOB: 1950/09/25, 66 y.o.   MRN: [id number]  HPI 66 year old male follows up from right BKA for extensive right foot wound. For that he underwent right SFA stenting by Dr. Oneida Alar and now has in-line flow below the knee. He has done very well from his amputation and is ready for prosthesis at this time.   Review of Systems No complaints    Objective:   Physical Exam aaox3 Non labored respirations Palpable femoral pulses Palpable right popliteal pulse Right bka is well healed with staples in place    Assessment/plan     66 year old male history of right below-knee dictation following stenting of his right SFA. He is now at home and is on aspirin and Plavix. His dictation site has healed well. I discussed with him the need to keep his amputation site free of injury it is okay to start using stump shrinker's. We will begin following his left lower extremity with ABI in 3 months. I counseled him on smoking cessation he does not seem very interested time.  [place] C. Donzetta Matters, MD [facility] Office: [phone] Pager: [phone]

## 2016-08-30 NOTE — Addendum Note (Signed)
Addended by: Lianne Cure A on: 08/30/2016 03:19 PM   Modules accepted: Orders

## 2016-10-28 NOTE — Addendum Note (Signed)
Addendum  created 10/28/16 1744 by Annye Asa, MD   Sign clinical note

## 2016-11-09 ENCOUNTER — Other Ambulatory Visit: Payer: Self-pay | Admitting: *Deleted

## 2016-11-10 ENCOUNTER — Other Ambulatory Visit: Payer: Self-pay | Admitting: *Deleted

## 2016-11-12 DIAGNOSIS — F17210 Nicotine dependence, cigarettes, uncomplicated: Secondary | ICD-10-CM | POA: Diagnosis not present

## 2016-11-12 DIAGNOSIS — R2689 Other abnormalities of gait and mobility: Secondary | ICD-10-CM | POA: Diagnosis not present

## 2016-11-12 DIAGNOSIS — I739 Peripheral vascular disease, unspecified: Secondary | ICD-10-CM | POA: Diagnosis not present

## 2016-11-12 DIAGNOSIS — Z89511 Acquired absence of right leg below knee: Secondary | ICD-10-CM | POA: Diagnosis not present

## 2016-11-12 DIAGNOSIS — Z44101 Encounter for fitting and adjustment of unspecified right artificial leg: Secondary | ICD-10-CM | POA: Diagnosis not present

## 2016-11-17 DIAGNOSIS — R2689 Other abnormalities of gait and mobility: Secondary | ICD-10-CM | POA: Diagnosis not present

## 2016-11-17 DIAGNOSIS — Z89511 Acquired absence of right leg below knee: Secondary | ICD-10-CM | POA: Diagnosis not present

## 2016-11-17 DIAGNOSIS — I739 Peripheral vascular disease, unspecified: Secondary | ICD-10-CM | POA: Diagnosis not present

## 2016-11-17 DIAGNOSIS — F17210 Nicotine dependence, cigarettes, uncomplicated: Secondary | ICD-10-CM | POA: Diagnosis not present

## 2016-11-17 DIAGNOSIS — Z44101 Encounter for fitting and adjustment of unspecified right artificial leg: Secondary | ICD-10-CM | POA: Diagnosis not present

## 2016-11-19 DIAGNOSIS — F17210 Nicotine dependence, cigarettes, uncomplicated: Secondary | ICD-10-CM | POA: Diagnosis not present

## 2016-11-19 DIAGNOSIS — I739 Peripheral vascular disease, unspecified: Secondary | ICD-10-CM | POA: Diagnosis not present

## 2016-11-19 DIAGNOSIS — R2689 Other abnormalities of gait and mobility: Secondary | ICD-10-CM | POA: Diagnosis not present

## 2016-11-19 DIAGNOSIS — Z89511 Acquired absence of right leg below knee: Secondary | ICD-10-CM | POA: Diagnosis not present

## 2016-11-19 DIAGNOSIS — Z44101 Encounter for fitting and adjustment of unspecified right artificial leg: Secondary | ICD-10-CM | POA: Diagnosis not present

## 2016-11-20 DIAGNOSIS — F17210 Nicotine dependence, cigarettes, uncomplicated: Secondary | ICD-10-CM | POA: Diagnosis not present

## 2016-11-20 DIAGNOSIS — Z44101 Encounter for fitting and adjustment of unspecified right artificial leg: Secondary | ICD-10-CM | POA: Diagnosis not present

## 2016-11-20 DIAGNOSIS — Z89511 Acquired absence of right leg below knee: Secondary | ICD-10-CM | POA: Diagnosis not present

## 2016-11-20 DIAGNOSIS — I739 Peripheral vascular disease, unspecified: Secondary | ICD-10-CM | POA: Diagnosis not present

## 2016-11-20 DIAGNOSIS — R2689 Other abnormalities of gait and mobility: Secondary | ICD-10-CM | POA: Diagnosis not present

## 2016-11-22 DIAGNOSIS — I739 Peripheral vascular disease, unspecified: Secondary | ICD-10-CM | POA: Diagnosis not present

## 2016-11-22 DIAGNOSIS — R2689 Other abnormalities of gait and mobility: Secondary | ICD-10-CM | POA: Diagnosis not present

## 2016-11-22 DIAGNOSIS — Z44101 Encounter for fitting and adjustment of unspecified right artificial leg: Secondary | ICD-10-CM | POA: Diagnosis not present

## 2016-11-22 DIAGNOSIS — Z89511 Acquired absence of right leg below knee: Secondary | ICD-10-CM | POA: Diagnosis not present

## 2016-11-22 DIAGNOSIS — F17210 Nicotine dependence, cigarettes, uncomplicated: Secondary | ICD-10-CM | POA: Diagnosis not present

## 2016-11-25 DIAGNOSIS — I739 Peripheral vascular disease, unspecified: Secondary | ICD-10-CM | POA: Diagnosis not present

## 2016-11-25 DIAGNOSIS — F17210 Nicotine dependence, cigarettes, uncomplicated: Secondary | ICD-10-CM | POA: Diagnosis not present

## 2016-11-25 DIAGNOSIS — Z44101 Encounter for fitting and adjustment of unspecified right artificial leg: Secondary | ICD-10-CM | POA: Diagnosis not present

## 2016-11-25 DIAGNOSIS — R2689 Other abnormalities of gait and mobility: Secondary | ICD-10-CM | POA: Diagnosis not present

## 2016-11-25 DIAGNOSIS — Z89511 Acquired absence of right leg below knee: Secondary | ICD-10-CM | POA: Diagnosis not present

## 2016-11-26 ENCOUNTER — Encounter: Payer: Self-pay | Admitting: Family

## 2016-11-29 DIAGNOSIS — R2689 Other abnormalities of gait and mobility: Secondary | ICD-10-CM | POA: Diagnosis not present

## 2016-11-29 DIAGNOSIS — Z89511 Acquired absence of right leg below knee: Secondary | ICD-10-CM | POA: Diagnosis not present

## 2016-11-29 DIAGNOSIS — I739 Peripheral vascular disease, unspecified: Secondary | ICD-10-CM | POA: Diagnosis not present

## 2016-11-29 DIAGNOSIS — Z44101 Encounter for fitting and adjustment of unspecified right artificial leg: Secondary | ICD-10-CM | POA: Diagnosis not present

## 2016-11-29 DIAGNOSIS — F17210 Nicotine dependence, cigarettes, uncomplicated: Secondary | ICD-10-CM | POA: Diagnosis not present

## 2016-12-01 DIAGNOSIS — Z44101 Encounter for fitting and adjustment of unspecified right artificial leg: Secondary | ICD-10-CM | POA: Diagnosis not present

## 2016-12-01 DIAGNOSIS — R2689 Other abnormalities of gait and mobility: Secondary | ICD-10-CM | POA: Diagnosis not present

## 2016-12-01 DIAGNOSIS — I739 Peripheral vascular disease, unspecified: Secondary | ICD-10-CM | POA: Diagnosis not present

## 2016-12-01 DIAGNOSIS — Z89511 Acquired absence of right leg below knee: Secondary | ICD-10-CM | POA: Diagnosis not present

## 2016-12-01 DIAGNOSIS — F17210 Nicotine dependence, cigarettes, uncomplicated: Secondary | ICD-10-CM | POA: Diagnosis not present

## 2016-12-08 ENCOUNTER — Ambulatory Visit: Payer: Medicare Other | Admitting: Family

## 2016-12-08 ENCOUNTER — Ambulatory Visit (HOSPITAL_COMMUNITY)
Admission: RE | Admit: 2016-12-08 | Discharge: 2016-12-08 | Disposition: A | Discharge status: Home / Self Care | Payer: Medicare Other | Source: Ambulatory Visit | Attending: Vascular Surgery | Admitting: Vascular Surgery

## 2016-12-08 DIAGNOSIS — I739 Peripheral vascular disease, unspecified: Secondary | ICD-10-CM | POA: Insufficient documentation

## 2016-12-08 DIAGNOSIS — F17210 Nicotine dependence, cigarettes, uncomplicated: Secondary | ICD-10-CM | POA: Diagnosis not present

## 2016-12-08 DIAGNOSIS — Z89511 Acquired absence of right leg below knee: Secondary | ICD-10-CM | POA: Diagnosis not present

## 2016-12-08 DIAGNOSIS — Z95820 Peripheral vascular angioplasty status with implants and grafts: Secondary | ICD-10-CM | POA: Insufficient documentation

## 2016-12-14 ENCOUNTER — Telehealth: Payer: Self-pay | Admitting: *Deleted

## 2016-12-14 NOTE — Telephone Encounter (Signed)
Patient called requesting Dr Donzetta Matters to advise him on whether he would recommend him going to rehabilitation for 35 days to help him with adjustments concerning amputation, his mind and getting emotionally together.  I sent Dr Donzetta Matters a staff message requesting advice.

## 2017-01-28 ENCOUNTER — Ambulatory Visit: Payer: Medicare Other | Admitting: Family

## 2017-04-29 ENCOUNTER — Ambulatory Visit (INDEPENDENT_AMBULATORY_CARE_PROVIDER_SITE_OTHER): Payer: Medicare Other | Admitting: Family

## 2017-04-29 ENCOUNTER — Encounter: Payer: Self-pay | Admitting: Family

## 2017-04-29 ENCOUNTER — Other Ambulatory Visit: Payer: Self-pay

## 2017-04-29 VITALS — BP 172/98 | HR 96 | Temp 98.1°F | Resp 16 | Ht 69.0 in | Wt 171.0 lb

## 2017-04-29 DIAGNOSIS — I779 Disorder of arteries and arterioles, unspecified: Secondary | ICD-10-CM

## 2017-04-29 DIAGNOSIS — F17200 Nicotine dependence, unspecified, uncomplicated: Secondary | ICD-10-CM | POA: Diagnosis not present

## 2017-04-29 DIAGNOSIS — Z89511 Acquired absence of right leg below knee: Secondary | ICD-10-CM | POA: Diagnosis not present

## 2017-04-29 NOTE — Progress Notes (Signed)
Vitals:   04/29/17 1440  BP: (!) 177/94  Pulse: 93  Resp: 16  Temp: 98.1 F (36.7 C)  TempSrc: Oral  SpO2: 97%  Weight: 171 lb (77.6 kg)  Height: 5\' 9"  (1.753 m)

## 2017-04-29 NOTE — Progress Notes (Signed)
[facility]   CC: Follow up peripheral artery occlusive disease  History of Present Illness Calvin Mckee is a 67 y.o. 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 Donzetta Matters for extensive right foot wound. Before that he underwent right SFA stenting by Dr. Oneida Alar. He has done very well from his amputation.  Dr. Donzetta Matters last evaluated pt on 08-27-16. At that time right BKA site had healed well. Dr. Donzetta Matters discussed with him the need to keep his amputation site free of injury, and it is okay to start using stump shrinker's. We will begin following his left lower extremity with ABI in 3 months. Dr. Donzetta Matters counseled him on smoking cessation, he does not seem very interested at that time.  Pt was lost to follow up until today. He requested a prescription for an analgesic, states he stumbled, and fell 5 days ago and has pain in his right leg. He denies any claudication symptoms in lis left leg with walking. He is walking well with his right BKA prosthesis which he received through the [place], Level 4.   He has not had ABI's performed since August 2018.   Pt states [facility] is his PCP.   Diabetic: No Tobacco use: smoker  (1/2 ppd)  Pt meds include: Statin :No, he is unsure Betablocker: No, he is unsure what medications he takes ASA: Yes, but he is unsure Other anticoagulants/antiplatelets: was prescribed Plavix, does not know if he is taking   Past Medical History:  Diagnosis Date  . Arthritis   . Hypertension     Social History Social History   Tobacco Use  . Smoking status: Current Every Day Smoker    Packs/day: 0.25    Types: Cigarettes  . Smokeless tobacco: Never Used  Substance Use Topics  . Alcohol use: Yes    Comment: occassionally  . Drug use: Yes    Types: "Crack" cocaine, Other-see comments    Comment: percocet     Family History No family history on file.  Past Surgical History:  Procedure Laterality Date  . ABDOMINAL AORTOGRAM  N/A 07/23/2016   Procedure: Abdominal Aortogram;  Surgeon: Elam Dutch, MD;  Location: [place] [facility];  Service: Cardiovascular;  Laterality: N/A;  . AMPUTATION Right 07/27/2016   Procedure: RIGHT BELOW KNEE AMPUTATION;  Surgeon: Waynetta Sandy, MD;  Location: [facility];  Service: Vascular;  Laterality: Right;  . CERVICAL FUSION    . LOWER EXTREMITY ANGIOGRAPHY Right 07/23/2016   Procedure: Lower Extremity Angiography;  Surgeon: Elam Dutch, MD;  Location: [facility] [facility];  Service: Cardiovascular;  Laterality: Right;  . PERIPHERAL VASCULAR INTERVENTION Right 07/23/2016   Procedure: Peripheral Vascular Intervention;  Surgeon: Elam Dutch, MD;  Location: [facility];  Service: Cardiovascular;  Laterality: Right;  SFA     Allergies  Allergen Reactions  . No Known Allergies     Current Outpatient Medications  Medication Sig Dispense Refill  . amLODipine (NORVASC) 10 MG tablet Take 1 tablet (10 mg total) by mouth daily. 30 tablet 0  . Multiple Vitamin (MULTIVITAMIN WITH MINERALS) TABS tablet Take 1 tablet by mouth daily.    . polyethylene glycol (MIRALAX / GLYCOLAX) packet Take 17 g by mouth 2 (two) times daily. 14 each 0  . aspirin EC 81 MG EC tablet Take 1 tablet (81 mg total) by mouth daily.    . cholecalciferol (VITAMIN D) 1000 units tablet Take 1,000 Units by mouth  2 (two) times daily.    . clonazePAM (KLONOPIN) 0.5 MG tablet Take 0.5 tablets (0.25 mg total) by mouth every 8 (eight) hours as needed for anxiety. (Patient not taking: Reported on 04/29/2017) 45 tablet 5  . clopidogrel (PLAVIX) 75 MG tablet Take 1 tablet (75 mg total) by mouth daily. (Patient not taking: Reported on 04/29/2017)    . ibuprofen (ADVIL,MOTRIN) 800 MG tablet Take 800 mg by mouth every 8 (eight) hours as needed.    . magnesium oxide (MAG-OX) 400 MG tablet Take 1 tablet (400 mg total) by mouth daily. (Patient not taking: Reported on 04/29/2017) 30 tablet 0  . nystatin  (MYCOSTATIN/NYSTOP) powder Apply to perineal area topically daily    . oxyCODONE-acetaminophen (PERCOCET/ROXICET) 5-325 MG tablet Take 1-2 tablets by mouth every 6 (six) hours as needed for severe pain.    Marland Kitchen saccharomyces boulardii (FLORASTOR) 250 MG capsule Take 250 mg by mouth 2 (two) times daily.    Marland Kitchen senna-docusate (SENOKOT-S) 8.6-50 MG tablet Take 1 tablet by mouth at bedtime. (Patient not taking: Reported on 04/29/2017) 30 tablet 0  . sodium chloride 1 g tablet Take 1 g by mouth 2 (two) times daily with a meal.    . thiamine 100 MG tablet Take 1 tablet (100 mg total) by mouth daily. 30 tablet 0   No current facility-administered medications for this visit.     ROS: See HPI for pertinent positives and negatives.   Physical Examination  Vitals:   04/29/17 1440 04/29/17 1446  BP: (!) 177/94 (!) 172/98  Pulse: 93 96  Resp: 16   Temp: 98.1 F (36.7 C)   TempSrc: Oral   SpO2: 97%   Weight: 171 lb (77.6 kg)   Height: 5\' 9"  (1.753 m)    Body mass index is 25.25 kg/m.  General: A&O x 3, WDWN, fit appearing male. Gait: slight limp with right BKA prosthesis, no cane or walker  HENT: No gross abnromalities Eyes: PERRLA. Pulmonary: Respirations are non labored, CTAB, good air movement in all fields Cardiac: regular rhythm, no detected murmur.         Carotid Bruits Right Left   Negative Negative   Radial pulses are 2+ palpable bilaterally   Adominal aortic pulse is not palpable                         VASCULAR EXAM: Extremities without ischemic changes, without Gangrene; without open wounds. Right BKA prosthesis in place.                                                                                                           LE Pulses Right Left       FEMORAL  2+ palpable  2+ palpable        POPLITEAL  not palpable   not palpable       POSTERIOR TIBIAL  BKA   not palpable        DORSALIS PEDIS      ANTERIOR TIBIAL BKA  not palpable  Abdomen: soft, NT, no  palpable masses. Skin: no rashes, no ulcers noted. Musculoskeletal: no muscle wasting or atrophy.  Neurologic: A&O X 3; Appropriate Affect ; SENSATION: normal; MOTOR FUNCTION:  moving all extremities equally, motor strength 5/5 throughout. Speech is fluent/normal. CN 2-12 intact. Psychiatric: normal thought content, mood appropriate to clinical situation     ASSESSMENT: Calvin Mckee is a 67 y.o. 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 Donzetta Matters for extensive right foot wound. Before that he underwent right SFA stenting by Dr. Oneida Alar.  He is walking well with his right BKA prosthesis which he obtained through the [place].  He was lost to follow up since his visit in May 2018. He did return in August 2018 for ABI's only, no evaluation by a provider.  He has no claudication symptoms in his left leg with walking.  He fell on his right side five days ago and has right leg and hip pain from this; I advised him to see his PCP at the [place] re this.  He denies any problems with his right BKA stump.    His atherosclerotic risk factors include smoking and hypertension. Fortunately he does not have DM.    DATA  None today   ABI (Date: 12-08-16):  R: BKA   L:   ABI: 0.91 (was 1.06 on 07-20-16 at [facility]),   PT: tri (was bi)  DP: mono (was bi)  TBI: 0.68 Decline in left ABI from no disease to mild disease, tri and monophasic waveforms. Almost normal left TBI.  Right BKA.    PLAN:  Based on the patient's vascular studies and examination, pt will return to clinic in 6 months with left ABI. I advised him to gradually increase his walking.  The patient was counseled re smoking cessation and given several free resources re smoking cessation.  I discussed in depth with the patient the nature of atherosclerosis, and emphasized the importance of maximal medical management including strict control of blood pressure, blood glucose, and lipid levels, obtaining regular exercise, and cessation of  smoking.  The patient is aware that without maximal medical management the underlying atherosclerotic disease process will progress, limiting the benefit of any interventions.  The patient was given information about PAD including signs, symptoms, treatment, what symptoms should prompt the patient to seek immediate medical care, and risk reduction measures to take.  Clemon Chambers, RN, MSN, FNP-C [facility] Phone: [phone]  Clinic MD: Bridgett Larsson  04/29/17 2:48 PM

## 2017-04-29 NOTE — Patient Instructions (Signed)
Steps to Quit Smoking Smoking tobacco can be bad for your health. It can also affect almost every organ in your body. Smoking puts you and people around you at risk for many serious long-lasting (chronic) diseases. Quitting smoking is hard, but it is one of the best things that you can do for your health. It is never too late to quit. What are the benefits of quitting smoking? When you quit smoking, you lower your risk for getting serious diseases and conditions. They can include:  Lung cancer or lung disease.  Heart disease.  Stroke.  Heart attack.  Not being able to have children (infertility).  Weak bones (osteoporosis) and broken bones (fractures).  If you have coughing, wheezing, and shortness of breath, those symptoms may get better when you quit. You may also get sick less often. If you are pregnant, quitting smoking can help to lower your chances of having a baby of low birth weight. What can I do to help me quit smoking? Talk with your doctor about what can help you quit smoking. Some things you can do (strategies) include:  Quitting smoking totally, instead of slowly cutting back how much you smoke over a period of time.  Going to in-person counseling. You are more likely to quit if you go to many counseling sessions.  Using resources and support systems, such as: ? Online chats with a counselor. ? Phone quitlines. ? Printed self-help materials. ? Support groups or group counseling. ? Text messaging programs. ? Mobile phone apps or applications.  Taking medicines. Some of these medicines may have nicotine in them. If you are pregnant or breastfeeding, do not take any medicines to quit smoking unless your doctor says it is okay. Talk with your doctor about counseling or other things that can help you.  Talk with your doctor about using more than one strategy at the same time, such as taking medicines while you are also going to in-person counseling. This can help make  quitting easier. What things can I do to make it easier to quit? Quitting smoking might feel very hard at first, but there is a lot that you can do to make it easier. Take these steps:  Talk to your family and friends. Ask them to support and encourage you.  Call phone quitlines, reach out to support groups, or work with a counselor.  Ask people who smoke to not smoke around you.  Avoid places that make you want (trigger) to smoke, such as: ? Bars. ? Parties. ? Smoke-break areas at work.  Spend time with people who do not smoke.  Lower the stress in your life. Stress can make you want to smoke. Try these things to help your stress: ? Getting regular exercise. ? Deep-breathing exercises. ? Yoga. ? Meditating. ? Doing a body scan. To do this, close your eyes, focus on one area of your body at a time from head to toe, and notice which parts of your body are tense. Try to relax the muscles in those areas.  Download or buy apps on your mobile phone or tablet that can help you stick to your quit plan. There are many free apps, such as QuitGuide from the CDC (Centers for Disease Control and Prevention). You can find more support from smokefree.gov and other websites.  This information is not intended to replace advice given to you by your health care provider. Make sure you discuss any questions you have with your health care provider. Document Released: 01/23/2009 Document   Revised: 11/25/2015 Document Reviewed: 08/13/2014 Elsevier Interactive Patient Education  2018 Elsevier Inc.     Peripheral Vascular Disease Peripheral vascular disease (PVD) is a disease of the blood vessels that are not part of your heart and brain. A simple term for PVD is poor circulation. In most cases, PVD narrows the blood vessels that carry blood from your heart to the rest of your body. This can result in a decreased supply of blood to your arms, legs, and internal organs, like your stomach or kidneys.  However, it most often affects a person's lower legs and feet. There are two types of PVD.  Organic PVD. This is the more common type. It is caused by damage to the structure of blood vessels.  Functional PVD. This is caused by conditions that make blood vessels contract and tighten (spasm).  Without treatment, PVD tends to get worse over time. PVD can also lead to acute ischemic limb. This is when an arm or limb suddenly has trouble getting enough blood. This is a medical emergency. Follow these instructions at home:  Take medicines only as told by your doctor.  Do not use any tobacco products, including cigarettes, chewing tobacco, or electronic cigarettes. If you need help quitting, ask your doctor.  Lose weight if you are overweight, and maintain a healthy weight as told by your doctor.  Eat a diet that is low in fat and cholesterol. If you need help, ask your doctor.  Exercise regularly. Ask your doctor for some good activities for you.  Take good care of your feet. ? Wear comfortable shoes that fit well. ? Check your feet often for any cuts or sores. Contact a doctor if:  You have cramps in your legs while walking.  You have leg pain when you are at rest.  You have coldness in a leg or foot.  Your skin changes.  You are unable to get or have an erection (erectile dysfunction).  You have cuts or sores on your feet that are not healing. Get help right away if:  Your arm or leg turns cold and blue.  Your arms or legs become red, warm, swollen, painful, or numb.  You have chest pain or trouble breathing.  You suddenly have weakness in your face, arm, or leg.  You become very confused or you cannot speak.  You suddenly have a very bad headache.  You suddenly cannot see. This information is not intended to replace advice given to you by your health care provider. Make sure you discuss any questions you have with your health care provider. Document Released:  06/23/2009 Document Revised: 09/04/2015 Document Reviewed: 09/06/2013 Elsevier Interactive Patient Education  2017 Elsevier Inc.  

## 2017-05-11 NOTE — Addendum Note (Signed)
Addended by: Lianne Cure A on: 05/11/2017 12:29 PM   Modules accepted: Orders

## 2017-10-28 ENCOUNTER — Ambulatory Visit: Payer: Medicare Other | Admitting: Family

## 2017-10-28 ENCOUNTER — Encounter (HOSPITAL_COMMUNITY): Payer: Self-pay

## 2017-11-21 ENCOUNTER — Encounter: Payer: Self-pay | Admitting: Family

## 2017-11-21 ENCOUNTER — Ambulatory Visit (INDEPENDENT_AMBULATORY_CARE_PROVIDER_SITE_OTHER): Payer: Medicare HMO | Admitting: Family

## 2017-11-21 ENCOUNTER — Other Ambulatory Visit: Payer: Self-pay

## 2017-11-21 ENCOUNTER — Ambulatory Visit (HOSPITAL_COMMUNITY)
Admission: RE | Admit: 2017-11-21 | Discharge: 2017-11-21 | Disposition: A | Discharge status: Home / Self Care | Payer: Medicare HMO | Source: Ambulatory Visit | Attending: Family | Admitting: Family

## 2017-11-21 VITALS — BP 144/89 | HR 81 | Temp 98.2°F | Resp 18 | Ht 68.5 in | Wt 176.0 lb

## 2017-11-21 DIAGNOSIS — F17200 Nicotine dependence, unspecified, uncomplicated: Secondary | ICD-10-CM | POA: Diagnosis not present

## 2017-11-21 DIAGNOSIS — I779 Disorder of arteries and arterioles, unspecified: Secondary | ICD-10-CM

## 2017-11-21 DIAGNOSIS — I10 Essential (primary) hypertension: Secondary | ICD-10-CM | POA: Insufficient documentation

## 2017-11-21 DIAGNOSIS — Z89511 Acquired absence of right leg below knee: Secondary | ICD-10-CM

## 2017-11-21 DIAGNOSIS — I739 Peripheral vascular disease, unspecified: Secondary | ICD-10-CM | POA: Insufficient documentation

## 2017-11-21 NOTE — Progress Notes (Signed)
[facility]   CC: Follow up peripheral artery occlusive disease  History of Present Illness Calvin Mckee is a 67 y.o. 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 Donzetta Matters for extensive right foot wound. Before that he underwent right SFA stenting by Dr. Oneida Alar. He has done very well from his amputation.  Dr. Donzetta Matters last evaluated pt on 08-27-16. At that time right BKA site had healed well. Dr. Donzetta Matters discussed with him the need to keep his amputation site free of injury, and it is okay to start using stump shrinker's. We will begin following his left lower extremity with ABI in 3 months. Dr. Donzetta Matters counseled him on smoking cessation, he did not seem very interested at that time.  Pt was lost to follow up until 04-29-17. He requested a prescription for an analgesic, states he stumbled, and fell 5 days prior and has pain in his right stump. He denies any claudication symptoms in lis left leg with walking. He was walking well with his right BKA prosthesis which he received through the [place], Level 4.   Pt states [facility] is his PCP.   Diabetic: No Tobacco use: smoker  (1/2 ppd)  Pt meds include: Statin :No, he is unsure Betablocker: No, he is unsure what medications he takes ASA: no, states he is not taking, advised to take 81 mg daily ASA Other anticoagulants/antiplatelets: was prescribed Plavix, not taking    Past Medical History:  Diagnosis Date  . Arthritis   . Hypertension   . PAD (peripheral artery disease) (HCC)     Social History Social History   Tobacco Use  . Smoking status: Current Every Day Smoker    Packs/day: 0.25    Types: Cigarettes  . Smokeless tobacco: Never Used  Substance Use Topics  . Alcohol use: Yes    Comment: occassionally  . Drug use: Yes    Types: "Crack" cocaine, Other-see comments    Comment: percocet     Family History History reviewed. No pertinent family history.  Past Surgical History:  Procedure  Laterality Date  . ABDOMINAL AORTOGRAM N/A 07/23/2016   Procedure: Abdominal Aortogram;  Surgeon: Elam Dutch, MD;  Location: [place] [facility];  Service: Cardiovascular;  Laterality: N/A;  . AMPUTATION Right 07/27/2016   Procedure: RIGHT BELOW KNEE AMPUTATION;  Surgeon: Waynetta Sandy, MD;  Location: [facility];  Service: Vascular;  Laterality: Right;  . CERVICAL FUSION    . LOWER EXTREMITY ANGIOGRAPHY Right 07/23/2016   Procedure: Lower Extremity Angiography;  Surgeon: Elam Dutch, MD;  Location: [place] [facility];  Service: Cardiovascular;  Laterality: Right;  . PERIPHERAL VASCULAR INTERVENTION Right 07/23/2016   Procedure: Peripheral Vascular Intervention;  Surgeon: Elam Dutch, MD;  Location: [place] [facility];  Service: Cardiovascular;  Laterality: Right;  SFA     Allergies  Allergen Reactions  . No Known Allergies     Current Outpatient Medications  Medication Sig Dispense Refill  . amLODipine (NORVASC) 10 MG tablet Take 1 tablet (10 mg total) by mouth daily. 30 tablet 0  . aspirin EC 81 MG EC tablet Take 1 tablet (81 mg total) by mouth daily.    . cholecalciferol (VITAMIN D) 1000 units tablet Take 1,000 Units by mouth 2 (two) times daily.    . clonazePAM (KLONOPIN) 0.5 MG tablet Take 0.5 tablets (0.25 mg total) by mouth every 8 (eight) hours as needed for anxiety. 45 tablet 5  . clopidogrel (PLAVIX) 75  MG tablet Take 1 tablet (75 mg total) by mouth daily.    Marland Kitchen ibuprofen (ADVIL,MOTRIN) 800 MG tablet Take 800 mg by mouth every 8 (eight) hours as needed.    . magnesium oxide (MAG-OX) 400 MG tablet Take 1 tablet (400 mg total) by mouth daily. 30 tablet 0  . Multiple Vitamin (MULTIVITAMIN WITH MINERALS) TABS tablet Take 1 tablet by mouth daily.    Marland Kitchen nystatin (MYCOSTATIN/NYSTOP) powder Apply to perineal area topically daily    . polyethylene glycol (MIRALAX / GLYCOLAX) packet Take 17 g by mouth 2 (two) times daily. 14 each 0  . saccharomyces boulardii (FLORASTOR)  250 MG capsule Take 250 mg by mouth 2 (two) times daily.    Marland Kitchen senna-docusate (SENOKOT-S) 8.6-50 MG tablet Take 1 tablet by mouth at bedtime. 30 tablet 0  . sodium chloride 1 g tablet Take 1 g by mouth 2 (two) times daily with a meal.    . thiamine 100 MG tablet Take 1 tablet (100 mg total) by mouth daily. 30 tablet 0  . oxyCODONE-acetaminophen (PERCOCET/ROXICET) 5-325 MG tablet Take 1-2 tablets by mouth every 6 (six) hours as needed for severe pain.     No current facility-administered medications for this visit.     ROS: See HPI for pertinent positives and negatives.   Physical Examination  Vitals:   11/21/17 0837 11/21/17 0840  BP: (!) 167/92 (!) 144/89  Pulse: 81 81  Resp: 18   Temp: 98.2 F (36.8 C)   TempSrc: Oral   SpO2: 97%   Weight: 176 lb (79.8 kg)   Height: 5' 8.5" (1.74 m)    Body mass index is 26.37 kg/m.  General: A&O x 3, WDWN, fit appearing male. Gait: slight limp with right BKA prosthesis, using a cane HENT: No gross abnromalities Eyes: PERRLA. Pulmonary: Respirations are non labored, CTAB, good air movement in all fields Cardiac: regular rhythm, no detected murmur.         Carotid Bruits Right Left   Negative Negative   Radial pulses are 2+ palpable bilaterally   Adominal aortic pulse is not palpable                         VASCULAR EXAM: Extremities without ischemic changes, without Gangrene; without open wounds. Right BKA prosthesis was removed by pt, there is a small healing abrasion at the medial aspect of his right knee, where pt states part of the prosthesis is rubbing.                                                                                                                                                        LE Pulses Right Left       FEMORAL  2+ palpable  2+ palpable  POPLITEAL  not palpable   not palpable       POSTERIOR TIBIAL  BKA   not palpable        DORSALIS PEDIS      ANTERIOR TIBIAL BKA  not palpable     Abdomen: soft, NT, no palpable masses. Skin: no rashes, no ulcers noted. Musculoskeletal: no muscle wasting or atrophy.      Neurologic: A&O X 3; Appropriate Affect ; SENSATION: normal; MOTOR FUNCTION:  moving all extremities equally, motor strength 5/5 throughout. Speech is fluent/normal. CN 2-12 intact. Psychiatric: normal thought content, mood appropriate to clinical situation    ASSESSMENT: Calvin Mckee is a 67 y.o. 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 Donzetta Matters for extensive right foot wound. Before that he underwent right SFA stenting by Dr. Oneida Alar.  He is walking well with his right BKA prosthesis which he obtained through the [place].  He was lost to follow up since his visit in May 2018 to his visit on 04-29-17. He did return in August 2018 for ABI's only, no evaluation by a provider.  He has no claudication symptoms in his left leg with walking.  He reports tingling in both of his hands, states he has a hx of c-spine fusion; I advised him to see his PCP at the [place] re this.   His atherosclerotic risk factors include smoking and hypertension. Fortunately he does not have DM.   His right BKA prosthesis is causing an abrasion at the medial aspect of his right knee, is healing with makeshift padding. Pt requested and given a prescription for refitting of right BKA prosthesis from [facility].    DATA  ABI (Date: 11/21/2017):  R:BKA  L:   ABI: 1.04 (was 0.91 on 12-08-16),   PT: tri  DP: tri  TBI: 0.80 (was 0.68) Improved right ABI and TBI, from mild disease to normal with triphasic waveforms.     PLAN:  Based on the patient's vascular studies and examination, pt will return to clinic in 9 months with left ABI. I advised him to gradually increase his walking.  The patient was counseled re smoking cessation and given several free resources re smoking cessation.   I discussed in depth with the patient the nature of atherosclerosis, and emphasized the  importance of maximal medical management including strict control of blood pressure, blood glucose, and lipid levels, obtaining regular exercise, and cessation of smoking.  The patient is aware that without maximal medical management the underlying atherosclerotic disease process will progress, limiting the benefit of any interventions.  The patient was given information about PAD including signs, symptoms, treatment, what symptoms should prompt the patient to seek immediate medical care, and risk reduction measures to take.  Clemon Chambers, RN, MSN, FNP-C [facility] Phone: [phone]  Clinic MD: Ardelle Lesches  11/21/17 8:52 AM

## 2017-11-21 NOTE — Progress Notes (Signed)
Vitals:   11/21/17 0837  BP: (!) 167/92  Pulse: 81  Resp: 18  Temp: 98.2 F (36.8 C)  TempSrc: Oral  SpO2: 97%  Weight: 176 lb (79.8 kg)  Height: 5' 8.5" (1.74 m)

## 2017-11-21 NOTE — Patient Instructions (Signed)
Steps to Quit Smoking Smoking tobacco can be bad for your health. It can also affect almost every organ in your body. Smoking puts you and people around you at risk for many serious long-lasting (chronic) diseases. Quitting smoking is hard, but it is one of the best things that you can do for your health. It is never too late to quit. What are the benefits of quitting smoking? When you quit smoking, you lower your risk for getting serious diseases and conditions. They can include:  Lung cancer or lung disease.  Heart disease.  Stroke.  Heart attack.  Not being able to have children (infertility).  Weak bones (osteoporosis) and broken bones (fractures).  If you have coughing, wheezing, and shortness of breath, those symptoms may get better when you quit. You may also get sick less often. If you are pregnant, quitting smoking can help to lower your chances of having a baby of low birth weight. What can I do to help me quit smoking? Talk with your doctor about what can help you quit smoking. Some things you can do (strategies) include:  Quitting smoking totally, instead of slowly cutting back how much you smoke over a period of time.  Going to in-person counseling. You are more likely to quit if you go to many counseling sessions.  Using resources and support systems, such as: ? Online chats with a counselor. ? Phone quitlines. ? Printed self-help materials. ? Support groups or group counseling. ? Text messaging programs. ? Mobile phone apps or applications.  Taking medicines. Some of these medicines may have nicotine in them. If you are pregnant or breastfeeding, do not take any medicines to quit smoking unless your doctor says it is okay. Talk with your doctor about counseling or other things that can help you.  Talk with your doctor about using more than one strategy at the same time, such as taking medicines while you are also going to in-person counseling. This can help make  quitting easier. What things can I do to make it easier to quit? Quitting smoking might feel very hard at first, but there is a lot that you can do to make it easier. Take these steps:  Talk to your family and friends. Ask them to support and encourage you.  Call phone quitlines, reach out to support groups, or work with a counselor.  Ask people who smoke to not smoke around you.  Avoid places that make you want (trigger) to smoke, such as: ? Bars. ? Parties. ? Smoke-break areas at work.  Spend time with people who do not smoke.  Lower the stress in your life. Stress can make you want to smoke. Try these things to help your stress: ? Getting regular exercise. ? Deep-breathing exercises. ? Yoga. ? Meditating. ? Doing a body scan. To do this, close your eyes, focus on one area of your body at a time from head to toe, and notice which parts of your body are tense. Try to relax the muscles in those areas.  Download or buy apps on your mobile phone or tablet that can help you stick to your quit plan. There are many free apps, such as QuitGuide from the CDC (Centers for Disease Control and Prevention). You can find more support from smokefree.gov and other websites.  This information is not intended to replace advice given to you by your health care provider. Make sure you discuss any questions you have with your health care provider. Document Released: 01/23/2009 Document   Revised: 11/25/2015 Document Reviewed: 08/13/2014 Elsevier Interactive Patient Education  2018 Elsevier Inc.     Peripheral Vascular Disease Peripheral vascular disease (PVD) is a disease of the blood vessels that are not part of your heart and brain. A simple term for PVD is poor circulation. In most cases, PVD narrows the blood vessels that carry blood from your heart to the rest of your body. This can result in a decreased supply of blood to your arms, legs, and internal organs, like your stomach or kidneys.  However, it most often affects a person's lower legs and feet. There are two types of PVD.  Organic PVD. This is the more common type. It is caused by damage to the structure of blood vessels.  Functional PVD. This is caused by conditions that make blood vessels contract and tighten (spasm).  Without treatment, PVD tends to get worse over time. PVD can also lead to acute ischemic limb. This is when an arm or limb suddenly has trouble getting enough blood. This is a medical emergency. Follow these instructions at home:  Take medicines only as told by your doctor.  Do not use any tobacco products, including cigarettes, chewing tobacco, or electronic cigarettes. If you need help quitting, ask your doctor.  Lose weight if you are overweight, and maintain a healthy weight as told by your doctor.  Eat a diet that is low in fat and cholesterol. If you need help, ask your doctor.  Exercise regularly. Ask your doctor for some good activities for you.  Take good care of your feet. ? Wear comfortable shoes that fit well. ? Check your feet often for any cuts or sores. Contact a doctor if:  You have cramps in your legs while walking.  You have leg pain when you are at rest.  You have coldness in a leg or foot.  Your skin changes.  You are unable to get or have an erection (erectile dysfunction).  You have cuts or sores on your feet that are not healing. Get help right away if:  Your arm or leg turns cold and blue.  Your arms or legs become red, warm, swollen, painful, or numb.  You have chest pain or trouble breathing.  You suddenly have weakness in your face, arm, or leg.  You become very confused or you cannot speak.  You suddenly have a very bad headache.  You suddenly cannot see. This information is not intended to replace advice given to you by your health care provider. Make sure you discuss any questions you have with your health care provider. Document Released:  06/23/2009 Document Revised: 09/04/2015 Document Reviewed: 09/06/2013 Elsevier Interactive Patient Education  2017 Elsevier Inc.  

## 2017-11-28 ENCOUNTER — Encounter (HOSPITAL_COMMUNITY): Payer: Self-pay

## 2017-11-28 ENCOUNTER — Ambulatory Visit: Payer: Medicare Other | Admitting: Family

## 2017-12-01 ENCOUNTER — Telehealth (INDEPENDENT_AMBULATORY_CARE_PROVIDER_SITE_OTHER): Payer: Self-pay

## 2017-12-01 ENCOUNTER — Encounter (INDEPENDENT_AMBULATORY_CARE_PROVIDER_SITE_OTHER): Payer: Self-pay | Admitting: Surgery

## 2017-12-01 ENCOUNTER — Ambulatory Visit (INDEPENDENT_AMBULATORY_CARE_PROVIDER_SITE_OTHER): Payer: Self-pay

## 2017-12-01 ENCOUNTER — Ambulatory Visit (INDEPENDENT_AMBULATORY_CARE_PROVIDER_SITE_OTHER): Payer: Medicare HMO | Admitting: Surgery

## 2017-12-01 DIAGNOSIS — M4722 Other spondylosis with radiculopathy, cervical region: Secondary | ICD-10-CM | POA: Diagnosis not present

## 2017-12-01 DIAGNOSIS — M542 Cervicalgia: Secondary | ICD-10-CM | POA: Diagnosis not present

## 2017-12-01 DIAGNOSIS — R200 Anesthesia of skin: Secondary | ICD-10-CM | POA: Diagnosis not present

## 2017-12-01 MED ORDER — METHYLPREDNISOLONE 2 MG PO TABS: ORAL_TABLET | ORAL | 0 refills | Status: DC

## 2017-12-01 NOTE — Progress Notes (Signed)
Office Visit Note   Patient: Calvin Mckee           Date of Birth: Feb 23, 1951           MRN: [id number] Visit Date: 12/01/2017              Requested by: No referring provider defined for this encounter. PCP: Patient, No Pcp Per   Assessment & Plan: Visit Diagnoses:  1. Neck pain   2. Numbness in both hands   3. Other spondylosis with radiculopathy, cervical region     Plan: At this point I recommend conservative treatment with Medrol Dosepak 6-day taper.  Patient will follow-up the office in about 3 weeks Dr. Lorin Mercy for recheck.  If he continues have ongoing symptoms we will see if he needs to have MRI cervical spine versus getting NCV/EMG studies.  Follow-Up Instructions: Return in about 3 weeks (around 12/22/2017) for with Dr Lorin Mercy.   Orders:  Orders Placed This Encounter  Procedures  . XR Cervical Spine 2 or 3 views   Meds ordered this encounter  Medications  . methylPREDNISolone (MEDROL) 2 MG tablet    Sig: 6-day taper to be taken as directed    Dispense:  21 tablet    Refill:  0      Procedures: No procedures performed   Clinical Data: No additional findings.   Subjective: Chief Complaint  Patient presents with  . Neck - Pain    HPI 67 year old black male comes in today with complaints of intermittent neck pain and bilateral thumb numbness and tingling.  Patient states that he has had off-and-on neck pain for several years.  He is had previous C4-5 and C6-7 fusion several years ago and then went on to have a C6-7 posterior fusion with wiring by Dr. Lorin Mercy for pseudoarthrosis in 2010.  States that he occasionally has pain that radiates into the right shoulder and the numbness and tingling in the thumbs has been ongoing about a week.  No injury.  Not currently taking any medications for pain. Review of Systems No current cardiac pulmonary GI GU issues  Objective: Vital Signs: There were no vitals taken for this visit.  Physical Exam  Constitutional:  He is oriented to person, place, and time. No distress.  HENT:  Head: Normocephalic and atraumatic.  Eyes: Pupils are equal, round, and reactive to light. EOM are normal.  Neck:  Limitation in cervical spine range of motion with flexion and extension.  Negative Spurling test.  He has mild bilateral brachial plexus tenderness.  Pulmonary/Chest: No respiratory distress.  Musculoskeletal:  Bilateral shoulder and elbow exam unremarkable.  Negative Tinel's over the bilateral cubital tunnels.  Negative Tinel's or Phalen's test at the wrists.  No focal motor deficits.  Neurological: He is alert and oriented to person, place, and time.  Skin: Skin is warm and dry.    Ortho Exam  Specialty Comments:  No specialty comments available.  Imaging: No results found.   PMFS History: Patient Active Problem List   Diagnosis Date Noted  . S/P BKA (below knee amputation), right ([place]) 07/30/2016  . Constipation due to opioid therapy 07/30/2016  . Anemia   . Right foot infection   . Gangrene of right foot ([place]) 07/20/2016  . SIAD (syndrome of inappropriate antidiuresis) ([place]) 07/20/2016  . Benign essential HTN 07/20/2016  . Osteomyelitis ([place]) 07/10/2016  . Hypokalemia 07/10/2016  . Pain and swelling of right lower leg 07/10/2016  . Alcohol use 07/10/2016  . Polysubstance  abuse ([place]) 07/10/2016  . Tobacco use disorder 07/10/2016  . Cellulitis and abscess of toe of right foot 07/10/2016   Past Medical History:  Diagnosis Date  . Arthritis   . Hypertension   . PAD (peripheral artery disease) ([place])     History reviewed. No pertinent family history.  Past Surgical History:  Procedure Laterality Date  . ABDOMINAL AORTOGRAM N/A 07/23/2016   Procedure: Abdominal Aortogram;  Surgeon: Elam Dutch, MD;  Location: [facility];  Service: Cardiovascular;  Laterality: N/A;  . AMPUTATION Right 07/27/2016   Procedure: RIGHT BELOW KNEE AMPUTATION;  Surgeon: Waynetta Sandy, MD;   Location: [facility];  Service: Vascular;  Laterality: Right;  . CERVICAL FUSION    . LOWER EXTREMITY ANGIOGRAPHY Right 07/23/2016   Procedure: Lower Extremity Angiography;  Surgeon: Elam Dutch, MD;  Location: [facility];  Service: Cardiovascular;  Laterality: Right;  . PERIPHERAL VASCULAR INTERVENTION Right 07/23/2016   Procedure: Peripheral Vascular Intervention;  Surgeon: Elam Dutch, MD;  Location: [facility];  Service: Cardiovascular;  Laterality: Right;  SFA    Social History   Occupational History  . Not on file  Tobacco Use  . Smoking status: Current Every Day Smoker    Packs/day: 0.25    Types: Cigarettes  . Smokeless tobacco: Never Used  Substance and Sexual Activity  . Alcohol use: Yes    Comment: occassionally  . Drug use: Yes    Types: "Crack" cocaine, Other-see comments    Comment: percocet   . Sexual activity: Never

## 2017-12-01 NOTE — Telephone Encounter (Signed)
Patient called stating that pharmacy does not prescribe Medrol Rxfor 2mg ,  Rx needs to be changed to 4mg .  Cb# is [phone].  Please advise.  Thank you.

## 2017-12-01 NOTE — Telephone Encounter (Signed)
Talked with Benjiman Core PA and advised him that Rx for Medrol needed to be changed from 2mg  to 4mg .  Stated to change Rx to 4mg .   Called and advised pharmacist at [facility] on [address] at [phone]

## 2017-12-01 NOTE — Telephone Encounter (Signed)
Per our discussion 4 mg tablets

## 2017-12-02 NOTE — Telephone Encounter (Signed)
Noted.  Pharmacists at [facility] on [address] was informed on 12/01/2017 to change Rx from 2mg  to 4mg  .

## 2017-12-21 ENCOUNTER — Ambulatory Visit (INDEPENDENT_AMBULATORY_CARE_PROVIDER_SITE_OTHER): Payer: Medicare HMO | Admitting: Orthopaedic Surgery

## 2017-12-27 ENCOUNTER — Emergency Department (HOSPITAL_COMMUNITY)
Admission: EM | Admit: 2017-12-27 | Discharge: 2017-12-27 | Disposition: A | Discharge status: Home / Self Care | Payer: Medicare HMO | Attending: Emergency Medicine | Admitting: Emergency Medicine

## 2017-12-27 ENCOUNTER — Encounter (HOSPITAL_COMMUNITY): Payer: Self-pay | Admitting: Emergency Medicine

## 2017-12-27 ENCOUNTER — Other Ambulatory Visit: Payer: Self-pay

## 2017-12-27 DIAGNOSIS — Z7982 Long term (current) use of aspirin: Secondary | ICD-10-CM | POA: Diagnosis not present

## 2017-12-27 DIAGNOSIS — Z79899 Other long term (current) drug therapy: Secondary | ICD-10-CM | POA: Diagnosis not present

## 2017-12-27 DIAGNOSIS — I10 Essential (primary) hypertension: Secondary | ICD-10-CM | POA: Insufficient documentation

## 2017-12-27 DIAGNOSIS — M79646 Pain in unspecified finger(s): Secondary | ICD-10-CM

## 2017-12-27 DIAGNOSIS — M79644 Pain in right finger(s): Secondary | ICD-10-CM | POA: Diagnosis present

## 2017-12-27 DIAGNOSIS — F17210 Nicotine dependence, cigarettes, uncomplicated: Secondary | ICD-10-CM | POA: Insufficient documentation

## 2017-12-27 MED ORDER — METHOCARBAMOL 500 MG PO TABS: 500.0000 mg | ORAL_TABLET | Freq: Two times a day (BID) | ORAL | 0 refills | Status: DC

## 2017-12-27 MED ORDER — GABAPENTIN 300 MG PO CAPS: 300.0000 mg | ORAL_CAPSULE | Freq: Two times a day (BID) | ORAL | 0 refills | Status: DC

## 2017-12-27 NOTE — ED Triage Notes (Signed)
Pt arrives to ED with bilateral thumb pain worse with cold- pt states both thumbs also feel numb. Pt seen orthopedic MD and received steroids with no relief.

## 2017-12-27 NOTE — ED Notes (Signed)
Pt stable, ambulatory, states understanding of discharge instructions 

## 2017-12-27 NOTE — Discharge Instructions (Addendum)
Please read attached information. If you experience any new or worsening signs or symptoms please return to the emergency room for evaluation. Please follow-up with your primary care provider or specialist as discussed. Please use medication prescribed only as directed and discontinue taking if you have any concerning signs or symptoms.   °

## 2017-12-27 NOTE — ED Provider Notes (Signed)
[facility] EMERGENCY DEPARTMENT Provider Note   CSN: [id number] Arrival date & time: 12/27/17  0940    History   Chief Complaint Chief Complaint  Patient presents with  . Hand Pain    HPI Calvin Mckee is a 67 y.o. male.  HPI   67 year old male presents today with complaints of bilateral thumb pain.  Patient admits a 2 months history of numbness tingling and pain in the bilateral thumbs.  Patient notes that this does not extend up into the upper extremities, no other neurological deficits.  Patient notes that he has a prosthetic limb on the right and uses a cane and a wheelchair.  Patient also notes he has a history of cervical fusion and notes minimal pain at that site.  He denies any other neurological deficits, denies any trauma, infectious etiology.  Patient has not been using any medications at home but notes that he did use gabapentin in the past and would like a refill of that.   Past Medical History:  Diagnosis Date  . Arthritis   . Hypertension   . PAD (peripheral artery disease) [facility])     Patient Active Problem List   Diagnosis Date Noted  . S/P BKA (below knee amputation), right ([place]) 07/30/2016  . Constipation due to opioid therapy 07/30/2016  . Anemia   . Right foot infection   . Gangrene of right foot ([place]) 07/20/2016  . 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 Benign essential HTN 07/20/2016  . Osteomyelitis ([place]) 07/10/2016  . Hypokalemia 07/10/2016  . Pain and swelling of right lower leg 07/10/2016  . Alcohol use 07/10/2016  . Polysubstance abuse ([place]) 07/10/2016  . Tobacco use disorder 07/10/2016  . Cellulitis and abscess of toe of right foot 07/10/2016    Past Surgical History:  Procedure Laterality Date  . ABDOMINAL AORTOGRAM N/A 07/23/2016   Procedure: Abdominal Aortogram;  Surgeon: Elam Dutch, MD;  Location: [place] CV LAB;  Service: Cardiovascular;  Laterality: N/A;  . AMPUTATION Right  07/27/2016   Procedure: RIGHT BELOW KNEE AMPUTATION;  Surgeon: Waynetta Sandy, MD;  Location: [facility];  Service: Vascular;  Laterality: Right;  . CERVICAL FUSION    . LOWER EXTREMITY ANGIOGRAPHY Right 07/23/2016   Procedure: Lower Extremity Angiography;  Surgeon: Elam Dutch, MD;  Location: [place] CV LAB;  Service: Cardiovascular;  Laterality: Right;  . PERIPHERAL VASCULAR INTERVENTION Right 07/23/2016   Procedure: Peripheral Vascular Intervention;  Surgeon: Elam Dutch, MD;  Location: [place] [facility];  Service: Cardiovascular;  Laterality: Right;  SFA         Home Medications    Prior to Admission medications   Medication Sig Start Date End Date Taking? Authorizing Provider  amLODipine (NORVASC) 10 MG tablet Take 1 tablet (10 mg total) by mouth daily. Patient not taking: Reported on 12/01/2017 07/17/16   Florencia Reasons, MD  aspirin EC 81 MG EC tablet Take 1 tablet (81 mg total) by mouth daily. Patient not taking: Reported on 12/01/2017 07/30/16   Rosita Fire, MD  cholecalciferol (VITAMIN D) 1000 units tablet Take 1,000 Units by mouth 2 (two) times daily.    [provider]  clonazePAM (KLONOPIN) 0.5 MG tablet Take 0.5 tablets (0.25 mg total) by mouth every 8 (eight) hours as needed for anxiety. Patient not taking: Reported on 12/01/2017 07/30/16   Hollace Kinnier L, DO  clopidogrel (PLAVIX) 75 MG tablet Take 1 tablet (75 mg total) by mouth daily.  Patient not taking: Reported on 12/01/2017 07/30/16   Rosita Fire, MD  gabapentin (NEURONTIN) 300 MG capsule Take 1 capsule (300 mg total) by mouth 2 (two) times daily. 12/27/17   Swanson Farnell, Dellis Filbert, PA-C  hydrochlorothiazide (HYDRODIURIL) 50 MG tablet Take 50 mg by mouth daily.    [provider]  ibuprofen (ADVIL,MOTRIN) 800 MG tablet Take 800 mg by mouth every 8 (eight) hours as needed.    [provider]  losartan (COZAAR) 50 MG tablet Take 50 mg by mouth daily.    [provider]    magnesium oxide (MAG-OX) 400 MG tablet Take 1 tablet (400 mg total) by mouth daily. Patient not taking: Reported on 12/01/2017 07/17/16   Florencia Reasons, MD  methocarbamol (ROBAXIN) 500 MG tablet Take 1 tablet (500 mg total) by mouth 2 (two) times daily. 12/27/17   Greydis Stlouis, Dellis Filbert, PA-C  methylPREDNISolone (MEDROL) 2 MG tablet 6-day taper to be taken as directed 12/01/17   Lanae Crumbly, PA-C  Multiple Vitamin (MULTIVITAMIN WITH MINERALS) TABS tablet Take 1 tablet by mouth daily.    [provider]  nystatin (MYCOSTATIN/NYSTOP) powder Apply to perineal area topically daily    [provider]  omeprazole (PRILOSEC) 10 MG capsule Take 10 mg by mouth daily.    [provider]  oxyCODONE-acetaminophen (PERCOCET/ROXICET) 5-325 MG tablet Take 1-2 tablets by mouth every 6 (six) hours as needed for severe pain.    [provider]  polyethylene glycol (MIRALAX / GLYCOLAX) packet Take 17 g by mouth 2 (two) times daily. Patient not taking: Reported on 12/01/2017 07/17/16   Florencia Reasons, MD  saccharomyces boulardii (FLORASTOR) 250 MG capsule Take 250 mg by mouth 2 (two) times daily.    [provider]  senna-docusate (SENOKOT-S) 8.6-50 MG tablet Take 1 tablet by mouth at bedtime. Patient not taking: Reported on 12/01/2017 07/17/16   Florencia Reasons, MD  sodium chloride 1 g tablet Take 1 g by mouth 2 (two) times daily with a meal.    [provider]  thiamine 100 MG tablet Take 1 tablet (100 mg total) by mouth daily. Patient not taking: Reported on 12/01/2017 07/17/16   Florencia Reasons, MD    Family History No family history on file.  Social History Social History   Tobacco Use  . Smoking status: Current Every Day Smoker    Packs/day: 0.25    Types: Cigarettes  . Smokeless tobacco: Never Used  Substance Use Topics  . Alcohol use: Yes    Comment: occassionally  . Drug use: Yes    Types: "Crack" cocaine, Other-see comments    Comment: percocet      Allergies   No known  allergies   Review of Systems Review of Systems  All other systems reviewed and are negative.    Physical Exam Updated Vital Signs BP (!) 148/94 (BP Location: Left Arm)   Pulse 87   Temp 98.5 F (36.9 C) (Oral)   Resp 16   SpO2 98%   Physical Exam  Constitutional: He is oriented to person, place, and time. He appears well-developed and well-nourished.  HENT:  Head: Normocephalic and atraumatic.  Eyes: Pupils are equal, round, and reactive to light. Conjunctivae are normal. Right eye exhibits no discharge. Left eye exhibits no discharge. No scleral icterus.  Neck: Normal range of motion. No JVD present. No tracheal deviation present.  Pulmonary/Chest: Effort normal. No stridor.  Musculoskeletal:  Bilateral hands atraumatic no swelling or edema, cap refill intact, thumbs nontender to  palpation full active range of motion 5 out of 5 strength, radial pulse 2+ bilateral-upper and lower extremity sensation strength and motor function intact, right lower extremity with BKA  Neurological: He is alert and oriented to person, place, and time. Coordination normal.  Psychiatric: He has a normal mood and affect. His behavior is normal. Judgment and thought content normal.  Nursing note and vitals reviewed.    ED Treatments / Results  Labs (all labs ordered are listed, but only abnormal results are displayed) Labs Reviewed - No data to display  EKG None  Radiology No results found.  Procedures Procedures (including critical care time)  Medications Ordered in ED Medications - No data to display   Initial Impression / Assessment and Plan / ED Course  I have reviewed the triage vital signs and the nursing notes.  Pertinent labs & imaging results that were available during my care of the patient were reviewed by me and considered in my medical decision making (see chart for details).     Labs:   Imaging:  Consults:  Therapeutics:  Discharge Meds:   Assessment/Plan:  67 year old male presents today with complaints of thumb pain and paresthesias.  Uncertain etiology.  Low suspicion for cervical radiculopathy, likely from overuse given he uses wheelchair.  He has no neurological deficits, no signs or symptoms consistent with central cause.  Patient will be discharged with gabapentin, Robaxin, and close outpatient follow-up with orthopedic specialist and primary care.  He is given strict return precautions, he verbalized understanding and agreement to today's plan had no further questions or concerns   Final Clinical Impressions(s) / ED Diagnoses   Final diagnoses:  Thumb pain, unspecified laterality    ED Discharge Orders         Ordered    gabapentin (NEURONTIN) 300 MG capsule  2 times daily     12/27/17 1224    methocarbamol (ROBAXIN) 500 MG tablet  2 times daily     12/27/17 1224           Okey Regal, PA-C 12/27/17 1351    Mesner, Corene Cornea, MD 12/27/17 2246

## 2018-01-04 ENCOUNTER — Ambulatory Visit (INDEPENDENT_AMBULATORY_CARE_PROVIDER_SITE_OTHER): Payer: Medicare HMO | Admitting: Orthopaedic Surgery

## 2018-01-04 ENCOUNTER — Encounter (INDEPENDENT_AMBULATORY_CARE_PROVIDER_SITE_OTHER): Payer: Self-pay | Admitting: Orthopaedic Surgery

## 2018-01-04 VITALS — BP 161/97 | HR 79 | Ht 67.5 in | Wt 178.0 lb

## 2018-01-04 DIAGNOSIS — R200 Anesthesia of skin: Secondary | ICD-10-CM | POA: Diagnosis not present

## 2018-01-04 NOTE — Addendum Note (Signed)
Addended by: Meyer Cory on: 01/04/2018 10:01 AM   Modules accepted: Orders

## 2018-01-04 NOTE — Progress Notes (Signed)
Office Visit Note   Patient: Calvin Mckee           Date of Birth: 1950/07/29           MRN: [id number] Visit Date: 01/04/2018              Requested by: No referring provider defined for this encounter. PCP: Patient, No Pcp Per   Assessment & Plan: Visit Diagnoses:  1. Bilateral hand numbness     Plan: We will set him up for some electrical test to rule out carpal tunnel syndrome right greater than left.  He does have some arthritis at the base of the thumb and will consider x-rays and injection if his electrical tests are negative for carpal tunnel syndrome.  Follow-Up Instructions: No follow-ups on file.   Orders:  No orders of the defined types were placed in this encounter.  No orders of the defined types were placed in this encounter.     Procedures: No procedures performed   Clinical Data: No additional findings.   Subjective: Chief Complaint  Patient presents with  . Neck - Pain, Follow-up  . Right Hand - Pain, Follow-up  . Left Hand - Pain, Follow-up    HPI 67 year old male returns with bilateral hand numbness and tingling on the radial 3 fingers.  He states thumb feels cold.  He said previous C5-6 and C6-7 Cloward fusion without anterior plate and had a posterior fusion and wiring at C6-7.  Both levels are solid.  Mild spurring at C4-5 above the fusion.  Patient has more symptoms in his right hand and left hand.  Right-hand-dominant.  Original Cloward procedure was in 2008.  He occasionally drops objects.  He notices some stiffness in his neck but does not have any associated neck pain currently.  Review of Systems 14 point review of system positive for previous right BKA after gangrenous second toe and vascular procedure SFA.  Patient continues to smoke.  Negative for diabetes.  Positive for hypertension history of polysubstance abuse alcohol abuse.  Otherwise negative as it pertains HPI.   Objective: Vital Signs: BP (!) 161/97   Pulse 79   Ht 5'  7.5" (1.715 m)   Wt 178 lb (80.7 kg)   BMI 27.47 kg/m   Physical Exam  Constitutional: He is oriented to person, place, and time. He appears well-developed and well-nourished.  HENT:  Head: Normocephalic and atraumatic.  Eyes: Pupils are equal, round, and reactive to light. EOM are normal.  Neck: No tracheal deviation present. No thyromegaly present.  Cardiovascular: Normal rate.  Pulmonary/Chest: Effort normal. He has no wheezes.  Abdominal: Soft. Bowel sounds are normal.  Neurological: He is alert and oriented to person, place, and time.  Skin: Skin is warm and dry. Capillary refill takes less than 2 seconds.  Psychiatric: He has a normal mood and affect. His behavior is normal. Judgment and thought content normal.    Ortho Exam right BKA with prosthesis.  He has mild thenar atrophy of the right hand none on the left.  Mild abductor weakness on the right.  Some pain with carpal compression.  Interossei are strong upper extremity reflexes are 2+ no brachial plexus tenderness 50% cervical range of motion without pain negative Spurling.  Elbows reach full extension.  He has some tenderness at the base of the thumb mild tenderness on the right first dorsal compartment with negative Wynn Maudlin test.  Specialty Comments:  No specialty comments available.  Imaging: No results found.  PMFS History: Patient Active Problem List   Diagnosis Date Noted  . S/P BKA (below knee amputation), right ([place]) 07/30/2016  . Constipation due to opioid therapy 07/30/2016  . Anemia   . Right foot infection   . Gangrene of right foot ([place]) 07/20/2016  . SIAD (syndrome of inappropriate antidiuresis) ([place]) 07/20/2016  . Benign essential HTN 07/20/2016  . Osteomyelitis ([place]) 07/10/2016  . Hypokalemia 07/10/2016  . Pain and swelling of right lower leg 07/10/2016  . Alcohol use 07/10/2016  . Polysubstance abuse ([place]) 07/10/2016  . Tobacco use disorder 07/10/2016  . Cellulitis and abscess of toe of  right foot 07/10/2016   Past Medical History:  Diagnosis Date  . Arthritis   . Hypertension   . PAD (peripheral artery disease) ([facility])     No family history on file.  Past Surgical History:  Procedure Laterality Date  . ABDOMINAL AORTOGRAM N/A 07/23/2016   Procedure: Abdominal Aortogram;  Surgeon: Elam Dutch, MD;  Location: [place] [facility];  Service: Cardiovascular;  Laterality: N/A;  . AMPUTATION Right 07/27/2016   Procedure: RIGHT BELOW KNEE AMPUTATION;  Surgeon: Waynetta Sandy, MD;  Location: [facility];  Service: Vascular;  Laterality: Right;  . CERVICAL FUSION    . LOWER EXTREMITY ANGIOGRAPHY Right 07/23/2016   Procedure: Lower Extremity Angiography;  Surgeon: Elam Dutch, MD;  Location: [place] [facility];  Service: Cardiovascular;  Laterality: Right;  . PERIPHERAL VASCULAR INTERVENTION Right 07/23/2016   Procedure: Peripheral Vascular Intervention;  Surgeon: Elam Dutch, MD;  Location: [place] [facility] LAB;  Service: Cardiovascular;  Laterality: Right;  SFA    Social History   Occupational History  . Not on file  Tobacco Use  . Smoking status: Current Every Day Smoker    Packs/day: 0.25    Types: Cigarettes  . Smokeless tobacco: Never Used  Substance and Sexual Activity  . Alcohol use: Yes    Comment: occassionally  . Drug use: Yes    Types: "Crack" cocaine, Other-see comments    Comment: percocet   . Sexual activity: Never

## 2018-01-25 ENCOUNTER — Ambulatory Visit (INDEPENDENT_AMBULATORY_CARE_PROVIDER_SITE_OTHER): Payer: Medicare HMO | Admitting: Physical Medicine and Rehabilitation

## 2018-01-25 ENCOUNTER — Encounter (INDEPENDENT_AMBULATORY_CARE_PROVIDER_SITE_OTHER): Payer: Self-pay | Admitting: Physical Medicine and Rehabilitation

## 2018-01-25 DIAGNOSIS — R202 Paresthesia of skin: Secondary | ICD-10-CM

## 2018-01-25 NOTE — Progress Notes (Signed)
 .  Numeric Pain Rating Scale and Functional Assessment Average Pain 8   In the last MONTH (on 0-10 scale) has pain interfered with the following?  1. General activity like being  able to carry out your everyday physical activities such as walking, climbing stairs, carrying groceries, or moving a chair?  Rating(6)     

## 2018-01-26 ENCOUNTER — Encounter (INDEPENDENT_AMBULATORY_CARE_PROVIDER_SITE_OTHER): Payer: Self-pay | Admitting: Physical Medicine and Rehabilitation

## 2018-01-30 NOTE — Procedures (Signed)
EMG & NCV Findings: Evaluation of the left median motor and the right median motor nerves showed decreased conduction velocity (Elbow-Wrist, L43, R41 m/s).  The left ulnar motor and the right ulnar motor nerves showed decreased conduction velocity (B Elbow-Wrist, L48, R45 m/s).  The left median (across palm) sensory nerve showed prolonged distal peak latency (Wrist, 3.9 ms) and prolonged distal peak latency (Palm, 3.8 ms).  The right median (across palm) sensory nerve showed no response (Palm) and prolonged distal peak latency (4.1 ms).  The left ulnar sensory nerve showed prolonged distal peak latency (4.1 ms), reduced amplitude (14.0 V), and decreased conduction velocity (Wrist-5th Digit, 34 m/s).  The right ulnar sensory nerve showed prolonged distal peak latency (3.8 ms) and decreased conduction velocity (Wrist-5th Digit, 37 m/s).  All remaining nerves (as indicated in the following tables) were within normal limits.  All left vs. right side differences were within normal limits.    All examined muscles (as indicated in the following table) showed no evidence of electrical instability.    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 Patient has no history of diabetes but does have history of polysubstance abuse. **cannot rule out concomitant bilateral carpal tunnel entrapment.    There is no significant electrodiagnostic evidence of any other focal nerve entrapment, brachial plexopathy or cervical radiculopathy.   Recommendations: 1.  Follow-up with referring physician. 2.  Continue current management of symptoms.  Carpal tunnel injection may be diagnostically helpful.  ___________________________ Laurence Spates [facility] Board Certified, American Board of Physical Medicine and Rehabilitation    Nerve Conduction Studies Anti Sensory Summary Table   Stim Site NR Peak (ms)  Norm Peak (ms) P-T Amp (V) Norm P-T Amp Site1 Site2 Delta-P (ms) Dist (cm) Vel (m/s) Norm Vel (m/s)  Left Median Acr Palm Anti Sensory (2nd Digit)  32C  Wrist    *3.9 <3.6 15.5 >10 Wrist Palm 0.1 0.0    Palm    *3.8 <2.0 1.0         Right Median Acr Palm Anti Sensory (2nd Digit)  31.7C  Wrist    *4.1 <3.6 14.2 >10 Wrist Palm  0.0    Palm *NR  <2.0          Left Radial Anti Sensory (Base 1st Digit)  32.2C  Wrist    2.7 <3.1 20.2  Wrist Base 1st Digit 2.7 0.0    Right Radial Anti Sensory (Base 1st Digit)  31.3C  Wrist    2.8 <3.1 10.4  Wrist Base 1st Digit 2.8 0.0    Left Ulnar Anti Sensory (5th Digit)  32.1C  Wrist    *4.1 <3.7 *14.0 >15.0 Wrist 5th Digit 4.1 14.0 *34 >38  Right Ulnar Anti Sensory (5th Digit)  31.6C  Wrist    *3.8 <3.7 17.4 >15.0 Wrist 5th Digit 3.8 14.0 *37 >38   Motor Summary Table   Stim Site NR Onset (ms) Norm Onset (ms) O-P Amp (mV) Norm O-P Amp Site1 Site2 Delta-0 (ms) Dist (cm) Vel (m/s) Norm Vel (m/s)  Left Median Motor (Abd Poll Brev)  32.2C  Wrist    3.7 <4.2 9.2 >5 Elbow Wrist 5.1 22.0 *43 >50  Elbow    8.8  8.5         Right Median Motor (Abd Poll Brev)  31.6C  Wrist    4.0 <4.2 8.6 >5 Elbow Wrist 5.4 22.2 *41 >50  Elbow  9.4  7.9         Left Ulnar Motor (Abd Dig Min)  32.3C  Wrist    3.4 <4.2 8.5 >3 B Elbow Wrist 4.6 22.0 *48 >53  B Elbow    8.0  9.4  A Elbow B Elbow 1.9 10.0 53 >53  A Elbow    9.9  8.5         Right Ulnar Motor (Abd Dig Min)  32C  Wrist    3.3 <4.2 10.0 >3 B Elbow Wrist 4.9 22.0 *45 >53  B Elbow    8.2  8.4  A Elbow B Elbow 1.8 10.5 58 >53  A Elbow    10.0  8.7          EMG   Side Muscle Nerve Root Ins Act Fibs Psw Amp Dur Poly Recrt Int Fraser Din Comment  Right 1stDorInt Ulnar C8-T1 Nml Nml Nml Nml Nml 0 Nml Nml   Right Abd Poll Brev Median C8-T1 Nml Nml Nml Nml Nml 0 Nml Nml   Right ExtDigCom   Nml Nml Nml Nml Nml 0 Nml Nml   Right Triceps Radial C6-7-8 Nml Nml Nml Nml Nml 0 Nml Nml   Right Deltoid Axillary C5-6 Nml Nml  Nml Nml Nml 0 Nml Nml     Nerve Conduction Studies Anti Sensory Left/Right Comparison   Stim Site L Lat (ms) R Lat (ms) L-R Lat (ms) L Amp (V) R Amp (V) L-R Amp (%) Site1 Site2 L Vel (m/s) R Vel (m/s) L-R Vel (m/s)  Median Acr Palm Anti Sensory (2nd Digit)  32C  Wrist *3.9 *4.1 0.2 15.5 14.2 8.4 Wrist Palm     Palm *3.8   1.0         Radial Anti Sensory (Base 1st Digit)  32.2C  Wrist 2.7 2.8 0.1 20.2 10.4 48.5 Wrist Base 1st Digit     Ulnar Anti Sensory (5th Digit)  32.1C  Wrist *4.1 *3.8 0.3 *14.0 17.4 19.5 Wrist 5th Digit *34 *37 3   Motor Left/Right Comparison   Stim Site L Lat (ms) R Lat (ms) L-R Lat (ms) L Amp (mV) R Amp (mV) L-R Amp (%) Site1 Site2 L Vel (m/s) R Vel (m/s) L-R Vel (m/s)  Median Motor (Abd Poll Brev)  32.2C  Wrist 3.7 4.0 0.3 9.2 8.6 6.5 Elbow Wrist *43 *41 2  Elbow 8.8 9.4 0.6 8.5 7.9 7.1       Ulnar Motor (Abd Dig Min)  32.3C  Wrist 3.4 3.3 0.1 8.5 10.0 15.0 B Elbow Wrist *48 *45 3  B Elbow 8.0 8.2 0.2 9.4 8.4 10.6 A Elbow B Elbow 53 58 5  A Elbow 9.9 10.0 0.1 8.5 8.7 2.3          Waveforms:

## 2018-01-30 NOTE — Progress Notes (Signed)
Calvin Mckee - 67 y.o. male MRN [id number]  Date of birth: 1951-03-06  Office Visit Note: Visit Date: 01/25/2018 PCP: Patient, No Pcp Per Referred by: No ref. provider found  Subjective: Chief Complaint  Patient presents with  . Right Arm - Numbness, Tingling  . Left Arm - Tingling, Numbness  . Right Hand - Tingling, Numbness  . Left Hand - Tingling, Numbness   HPI: Calvin Mckee is a 67 y.o. male who comes in today For electrodiagnostic study of both upper limbs is requested by Dr. Rodell Perna.  Patient is right-hand dominant with history of C5-6 and C6-7 Cloward fusion.  He has posterior wiring at C6-7.  Patient reports 3 months history of worsening pain numbness and tingling in both arms and hands particularly the thumb and radial digits.  He reports that picking up objects driving and gripping things makes it worse.  He does feel like if he rubs his hands with alcohol it does seem to calm things down.  He has no history of diabetes or prior electrodiagnostic study.  He has had peripheral vascular disease.  Has a history of polysubstance abuse.  ROS Otherwise per HPI.  Assessment & Plan: Visit Diagnoses:  1. 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 Patient has no history of diabetes but does have history of polysubstance abuse. **cannot rule out concomitant bilateral carpal tunnel entrapment.    There is no significant electrodiagnostic evidence of any other focal nerve entrapment, brachial plexopathy or cervical radiculopathy.   Recommendations: 1.  Follow-up with referring physician. 2.  Continue current management of symptoms.  Carpal tunnel injection may be diagnostically helpful.  Meds & Orders: No orders of the defined types were placed in this encounter.   Orders Placed This Encounter  Procedures  . NCV  with EMG (electromyography)    Follow-up: Return for Rodell Perna, M.D..   Procedures: No procedures performed  EMG & NCV Findings: Evaluation of the left median motor and the right median motor nerves showed decreased conduction velocity (Elbow-Wrist, L43, R41 m/s).  The left ulnar motor and the right ulnar motor nerves showed decreased conduction velocity (B Elbow-Wrist, L48, R45 m/s).  The left median (across palm) sensory nerve showed prolonged distal peak latency (Wrist, 3.9 ms) and prolonged distal peak latency (Palm, 3.8 ms).  The right median (across palm) sensory nerve showed no response (Palm) and prolonged distal peak latency (4.1 ms).  The left ulnar sensory nerve showed prolonged distal peak latency (4.1 ms), reduced amplitude (14.0 V), and decreased conduction velocity (Wrist-5th Digit, 34 m/s).  The right ulnar sensory nerve showed prolonged distal peak latency (3.8 ms) and decreased conduction velocity (Wrist-5th Digit, 37 m/s).  All remaining nerves (as indicated in the following tables) were within normal limits.  All left vs. right side differences were within normal limits.    All examined muscles (as indicated in the following table) showed no evidence of electrical instability.    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 Patient has no history of diabetes but does have history of polysubstance abuse. **cannot rule out concomitant bilateral carpal tunnel entrapment.    There is no significant electrodiagnostic evidence of any other focal nerve entrapment, brachial plexopathy or cervical radiculopathy.   Recommendations: 1.  Follow-up with referring  physician. 2.  Continue current management of symptoms.  Carpal tunnel injection may be diagnostically helpful.  ___________________________ Laurence Spates [facility] Board Certified, American Board of  Physical Medicine and Rehabilitation    Nerve Conduction Studies Anti Sensory Summary Table   Stim Site NR Peak (ms) Norm Peak (ms) P-T Amp (V) Norm P-T Amp Site1 Site2 Delta-P (ms) Dist (cm) Vel (m/s) Norm Vel (m/s)  Left Median Acr Palm Anti Sensory (2nd Digit)  32C  Wrist    *3.9 <3.6 15.5 >10 Wrist Palm 0.1 0.0    Palm    *3.8 <2.0 1.0         Right Median Acr Palm Anti Sensory (2nd Digit)  31.7C  Wrist    *4.1 <3.6 14.2 >10 Wrist Palm  0.0    Palm *NR  <2.0          Left Radial Anti Sensory (Base 1st Digit)  32.2C  Wrist    2.7 <3.1 20.2  Wrist Base 1st Digit 2.7 0.0    Right Radial Anti Sensory (Base 1st Digit)  31.3C  Wrist    2.8 <3.1 10.4  Wrist Base 1st Digit 2.8 0.0    Left Ulnar Anti Sensory (5th Digit)  32.1C  Wrist    *4.1 <3.7 *14.0 >15.0 Wrist 5th Digit 4.1 14.0 *34 >38  Right Ulnar Anti Sensory (5th Digit)  31.6C  Wrist    *3.8 <3.7 17.4 >15.0 Wrist 5th Digit 3.8 14.0 *37 >38   Motor Summary Table   Stim Site NR Onset (ms) Norm Onset (ms) O-P Amp (mV) Norm O-P Amp Site1 Site2 Delta-0 (ms) Dist (cm) Vel (m/s) Norm Vel (m/s)  Left Median Motor (Abd Poll Brev)  32.2C  Wrist    3.7 <4.2 9.2 >5 Elbow Wrist 5.1 22.0 *43 >50  Elbow    8.8  8.5         Right Median Motor (Abd Poll Brev)  31.6C  Wrist    4.0 <4.2 8.6 >5 Elbow Wrist 5.4 22.2 *41 >50  Elbow    9.4  7.9         Left Ulnar Motor (Abd Dig Min)  32.3C  Wrist    3.4 <4.2 8.5 >3 B Elbow Wrist 4.6 22.0 *48 >53  B Elbow    8.0  9.4  A Elbow B Elbow 1.9 10.0 53 >53  A Elbow    9.9  8.5         Right Ulnar Motor (Abd Dig Min)  32C  Wrist    3.3 <4.2 10.0 >3 B Elbow Wrist 4.9 22.0 *45 >53  B Elbow    8.2  8.4  A Elbow B Elbow 1.8 10.5 58 >53  A Elbow    10.0  8.7          EMG   Side Muscle Nerve Root Ins Act Fibs Psw Amp Dur Poly Recrt Int Fraser Din Comment  Right 1stDorInt Ulnar C8-T1 Nml Nml Nml Nml Nml 0 Nml Nml   Right Abd Poll Brev Median C8-T1 Nml Nml Nml Nml Nml 0 Nml Nml   Right ExtDigCom   Nml  Nml Nml Nml Nml 0 Nml Nml   Right Triceps Radial C6-7-8 Nml Nml Nml Nml Nml 0 Nml Nml   Right Deltoid Axillary C5-6 Nml Nml Nml Nml Nml 0 Nml Nml     Nerve Conduction Studies Anti Sensory Left/Right Comparison   Stim Site L Lat (ms) R Lat (ms) L-R Lat (ms) L Amp (V) R  Amp (V) L-R Amp (%) Site1 Site2 L Vel (m/s) R Vel (m/s) L-R Vel (m/s)  Median Acr Palm Anti Sensory (2nd Digit)  32C  Wrist *3.9 *4.1 0.2 15.5 14.2 8.4 Wrist Palm     Palm *3.8   1.0         Radial Anti Sensory (Base 1st Digit)  32.2C  Wrist 2.7 2.8 0.1 20.2 10.4 48.5 Wrist Base 1st Digit     Ulnar Anti Sensory (5th Digit)  32.1C  Wrist *4.1 *3.8 0.3 *14.0 17.4 19.5 Wrist 5th Digit *34 *37 3   Motor Left/Right Comparison   Stim Site L Lat (ms) R Lat (ms) L-R Lat (ms) L Amp (mV) R Amp (mV) L-R Amp (%) Site1 Site2 L Vel (m/s) R Vel (m/s) L-R Vel (m/s)  Median Motor (Abd Poll Brev)  32.2C  Wrist 3.7 4.0 0.3 9.2 8.6 6.5 Elbow Wrist *43 *41 2  Elbow 8.8 9.4 0.6 8.5 7.9 7.1       Ulnar Motor (Abd Dig Min)  32.3C  Wrist 3.4 3.3 0.1 8.5 10.0 15.0 B Elbow Wrist *48 *45 3  B Elbow 8.0 8.2 0.2 9.4 8.4 10.6 A Elbow B Elbow 53 58 5  A Elbow 9.9 10.0 0.1 8.5 8.7 2.3          Waveforms:                     Clinical History: No specialty comments available.   He reports that he has been smoking cigarettes. He has been smoking about 0.25 packs per day. He has never used smokeless tobacco. No results for input(s): HGBA1C, LABURIC in the last 8760 hours.  Objective:  VS:  HT:    WT:   BMI:     BP:   HR: bpm  TEMP: ( )  RESP:  Physical Exam  Constitutional: He is oriented to person, place, and time.  Musculoskeletal: He exhibits no edema or tenderness.  Inspection reveals no atrophy of the bilateral APB or FDI or hand intrinsics. There is no swelling, color changes, allodynia or dystrophic changes. There is 5 out of 5 strength in the bilateral wrist extension, finger abduction and long finger flexion. There is  intact sensation to light touch in all dermatomal and peripheral nerve distributions.  There is a negative Phalen's test bilaterally. There is a negative Hoffmann's test bilaterally.  Neurological: He is alert and oriented to person, place, and time. He exhibits normal muscle tone. Coordination normal.  Skin: Skin is warm and dry. No rash noted. No erythema.    Ortho Exam Imaging: No results found.  Past Medical/Family/Surgical/Social History: Medications & Allergies reviewed per EMR, new medications updated. Patient Active Problem List   Diagnosis Date Noted  . S/P BKA (below knee amputation), right ([place]) 07/30/2016  . Constipation due to opioid therapy 07/30/2016  . Anemia   . Right foot infection   . Gangrene of right foot ([place]) 07/20/2016  . SIAD (syndrome of inappropriate antidiuresis) ([place]) 07/20/2016  . Benign essential HTN 07/20/2016  . Osteomyelitis ([place]) 07/10/2016  . Hypokalemia 07/10/2016  . Pain and swelling of right lower leg 07/10/2016  . Alcohol use 07/10/2016  . Polysubstance abuse ([place]) 07/10/2016  . Tobacco use disorder 07/10/2016  . Cellulitis and abscess of toe of right foot 07/10/2016   Past Medical History:  Diagnosis Date  . Arthritis   . Hypertension   . PAD (peripheral artery disease) ([place])    History reviewed. No pertinent  family history. Past Surgical History:  Procedure Laterality Date  . ABDOMINAL AORTOGRAM N/A 07/23/2016   Procedure: Abdominal Aortogram;  Surgeon: Elam Dutch, MD;  Location: [place] [facility];  Service: Cardiovascular;  Laterality: N/A;  . AMPUTATION Right 07/27/2016   Procedure: RIGHT BELOW KNEE AMPUTATION;  Surgeon: Waynetta Sandy, MD;  Location: [place];  Service: Vascular;  Laterality: Right;  . CERVICAL FUSION    . LOWER EXTREMITY ANGIOGRAPHY Right 07/23/2016   Procedure: Lower Extremity Angiography;  Surgeon: Elam Dutch, MD;  Location: [place] [facility];  Service: Cardiovascular;  Laterality: Right;  .  PERIPHERAL VASCULAR INTERVENTION Right 07/23/2016   Procedure: Peripheral Vascular Intervention;  Surgeon: Elam Dutch, MD;  Location: [place] [facility];  Service: Cardiovascular;  Laterality: Right;  SFA    Social History   Occupational History  . Not on file  Tobacco Use  . Smoking status: Current Every Day Smoker    Packs/day: 0.25    Types: Cigarettes  . Smokeless tobacco: Never Used  Substance and Sexual Activity  . Alcohol use: Yes    Comment: occassionally  . Drug use: Yes    Types: "Crack" cocaine, Other-see comments    Comment: percocet   . Sexual activity: Never

## 2018-01-31 ENCOUNTER — Encounter (INDEPENDENT_AMBULATORY_CARE_PROVIDER_SITE_OTHER): Payer: Self-pay | Admitting: Orthopaedic Surgery

## 2018-01-31 ENCOUNTER — Ambulatory Visit (INDEPENDENT_AMBULATORY_CARE_PROVIDER_SITE_OTHER): Payer: Medicare HMO | Admitting: Orthopaedic Surgery

## 2018-01-31 VITALS — BP 142/77 | HR 90 | Ht 67.75 in | Wt 177.0 lb

## 2018-01-31 DIAGNOSIS — R200 Anesthesia of skin: Secondary | ICD-10-CM

## 2018-01-31 MED ORDER — METHYLPREDNISOLONE ACETATE 40 MG/ML IJ SUSP
13.3300 mg | INTRAMUSCULAR | Status: AC | PRN
  Administered 2018-01-31: 13.33 mg

## 2018-01-31 MED ORDER — LIDOCAINE HCL 1 % IJ SOLN
0.3000 mL | INTRAMUSCULAR | Status: AC | PRN
  Administered 2018-01-31: .3 mL

## 2018-01-31 MED ORDER — BUPIVACAINE HCL 0.25 % IJ SOLN
0.3300 mL | INTRAMUSCULAR | Status: AC | PRN
  Administered 2018-01-31: .33 mL

## 2018-01-31 NOTE — Progress Notes (Signed)
Office Visit Note   Patient: Calvin Mckee           Date of Birth: October 17, 1950           MRN: [id number] Visit Date: 01/31/2018              Requested by: No referring provider defined for this encounter. PCP: Patient, No Pcp Per   Assessment & Plan: Visit Diagnoses:  1. Bilateral hand numbness     Plan: Right carpal tunnel injection performed.  He noted significant improvement in his pain immediately after the injection.  He noted significant improvement in his pain right after the injection.  I will check him in 2 weeks we can consider injecting the opposite left carpal tunnel.  Follow-Up Instructions: Return in about 2 weeks (around 02/14/2018).   Orders:  Orders Placed This Encounter  Procedures  . Hand/UE Inj: R carpal tunnel   No orders of the defined types were placed in this encounter.     Procedures: Hand/UE Inj: R carpal tunnel for carpal tunnel syndrome on 01/31/2018 1:33 PM Details: 25 G needle, volar approach Medications: 0.3 mL lidocaine 1 %; 0.33 mL bupivacaine 0.25 %; 13.33 mg methylPREDNISolone acetate 40 MG/ML      Clinical Data: No additional findings.   Subjective: Chief Complaint  Patient presents with  . Neck - Pain, Follow-up    BUE NCS Review    HPI 67 year old male returns with bilateral hand numbness primarily radial 3 fingers.  Electrical test showed changes consistent with peripheral neuropathy likely related to polysubstance abuse in his distant past.  A1c April 2018 was 6.2.  He had right BKA April 2018 and is doing well with his prosthesis.  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  Unable to rule out concomitant median nerve compression at the wrist.  Review of Systems 14 point system update unchanged from 01/04/2018 office visit other than above.   Objective: Vital Signs: BP (!) 142/77   Pulse 90   Ht 5' 7.75" (1.721 m)   Wt 177 lb (80.3 kg)   BMI  27.11 kg/m   Physical Exam  Constitutional: He is oriented to person, place, and time. He appears well-developed and well-nourished.  HENT:  Head: Normocephalic and atraumatic.  Eyes: Pupils are equal, round, and reactive to light. EOM are normal.  Neck: No tracheal deviation present. No thyromegaly present.  Cardiovascular: Normal rate.  Pulmonary/Chest: Effort normal. He has no wheezes.  Abdominal: Soft. Bowel sounds are normal.  Neurological: He is alert and oriented to person, place, and time.  Skin: Skin is warm and dry. Capillary refill takes less than 2 seconds.  Psychiatric: He has a normal mood and affect. His behavior is normal. Judgment and thought content normal.    Ortho Exam right BKA prosthesis well fitted.  Mild thenar atrophy on the right none on the left.  Positive pain with carpal compression.  Some tenderness over the ulnar nerve bilaterally.  Specialty Comments:  No specialty comments available.  Imaging: No results found.   PMFS History: Patient Active Problem List   Diagnosis Date Noted  . S/P BKA (below knee amputation), right ([place]) 07/30/2016  . Constipation due to opioid therapy 07/30/2016  . Anemia   . Right foot infection   . Gangrene of right foot ([place]) 07/20/2016  . SIAD (syndrome of inappropriate antidiuresis) ([place]) 07/20/2016  . Benign essential HTN 07/20/2016  . Osteomyelitis ([place]) 07/10/2016  . Hypokalemia 07/10/2016  .  Pain and swelling of right lower leg 07/10/2016  . Alcohol use 07/10/2016  . Polysubstance abuse ([place]) 07/10/2016  . Tobacco use disorder 07/10/2016  . Cellulitis and abscess of toe of right foot 07/10/2016   Past Medical History:  Diagnosis Date  . Arthritis   . Hypertension   . PAD (peripheral artery disease) ([facility])     No family history on file.  Past Surgical History:  Procedure Laterality Date  . ABDOMINAL AORTOGRAM N/A 07/23/2016   Procedure: Abdominal Aortogram;  Surgeon: Elam Dutch, MD;  Location: [place] [facility];  Service: Cardiovascular;  Laterality: N/A;  . AMPUTATION Right 07/27/2016   Procedure: RIGHT BELOW KNEE AMPUTATION;  Surgeon: Waynetta Sandy, MD;  Location: [place];  Service: Vascular;  Laterality: Right;  . CERVICAL FUSION    . LOWER EXTREMITY ANGIOGRAPHY Right 07/23/2016   Procedure: Lower Extremity Angiography;  Surgeon: Elam Dutch, MD;  Location: [place] [facility];  Service: Cardiovascular;  Laterality: Right;  . PERIPHERAL VASCULAR INTERVENTION Right 07/23/2016   Procedure: Peripheral Vascular Intervention;  Surgeon: Elam Dutch, MD;  Location: [place] [facility];  Service: Cardiovascular;  Laterality: Right;  SFA    Social History   Occupational History  . Not on file  Tobacco Use  . Smoking status: Current Every Day Smoker    Packs/day: 0.25    Types: Cigarettes  . Smokeless tobacco: Never Used  Substance and Sexual Activity  . Alcohol use: Yes    Comment: occassionally  . Drug use: Yes    Types: "Crack" cocaine, Other-see comments    Comment: percocet   . Sexual activity: Never

## 2018-02-14 ENCOUNTER — Encounter (INDEPENDENT_AMBULATORY_CARE_PROVIDER_SITE_OTHER): Payer: Self-pay | Admitting: Orthopaedic Surgery

## 2018-02-14 ENCOUNTER — Ambulatory Visit (INDEPENDENT_AMBULATORY_CARE_PROVIDER_SITE_OTHER): Payer: Medicare HMO | Admitting: Orthopaedic Surgery

## 2018-02-14 VITALS — BP 161/80 | HR 86 | Ht 67.75 in | Wt 175.0 lb

## 2018-02-14 DIAGNOSIS — R200 Anesthesia of skin: Secondary | ICD-10-CM

## 2018-02-14 MED ORDER — METHYLPREDNISOLONE ACETATE 40 MG/ML IJ SUSP
13.3300 mg | INTRAMUSCULAR | Status: AC | PRN
  Administered 2018-02-14: 13.33 mg

## 2018-02-14 MED ORDER — BUPIVACAINE HCL 0.25 % IJ SOLN
0.3300 mL | INTRAMUSCULAR | Status: AC | PRN
  Administered 2018-02-14: .33 mL

## 2018-02-14 MED ORDER — LIDOCAINE HCL 1 % IJ SOLN
0.3000 mL | INTRAMUSCULAR | Status: AC | PRN
  Administered 2018-02-14: .3 mL

## 2018-02-14 NOTE — Progress Notes (Signed)
Office Visit Note   Patient: Calvin Mckee           Date of Birth: 15-Dec-1950           MRN: [id number] Visit Date: 02/14/2018              Requested by: No referring provider defined for this encounter. PCP: Patient, No Pcp Per   Assessment & Plan: Visit Diagnoses:  1. Bilateral hand numbness     Plan: Left carpal tunnel injection performed today which he tolerated well.  We will see how he does with this and he can return in 6 weeks.  He may have some median nerve compression with carpal tunnel syndrome but electrical test due to his sensorimotor polyneuropathy was unable to definitely diagnose this.  Follow-Up Instructions: No follow-ups on file.   Orders:  No orders of the defined types were placed in this encounter.  No orders of the defined types were placed in this encounter.     Procedures: Hand/UE Inj: L carpal tunnel for carpal tunnel syndrome on 02/14/2018 10:05 AM Medications: 0.3 mL lidocaine 1 %; 0.33 mL bupivacaine 0.25 %; 13.33 mg methylPREDNISolone acetate 40 MG/ML      Clinical Data: No additional findings.   Subjective: Chief Complaint  Patient presents with  . Right Hand - Follow-up  . Left Hand - Follow-up    HPI 67 year old male returns with bilateral hand numbness.  He had right carpal tunnel injection states that helped a bit more than 50% on 01/31/2018.  He is requesting a left carpal tunnel injection.  He wears his braces at night.  Electrical test showed peripheral neuropathy with sensorimotor involvement multiple nerves.  He had a history of polysubstance abuse in the past.  He uses braces on his wrist at night.  He has a right BK prosthesis and asked to use his hands to push himself up.  [facility] placed patient on some gabapentin 300 mg p.o. twice daily which he is been on for a few weeks it seems to be helping some.  Review of Systems 14 system update unchanged from 01/04/2018.   Objective: Vital Signs: BP (!) 161/80   Pulse 86   Ht 5'  7.75" (1.721 m)   Wt 175 lb (79.4 kg)   BMI 26.81 kg/m   Physical Exam  Constitutional: He is oriented to person, place, and time. He appears well-developed and well-nourished.  HENT:  Head: Normocephalic and atraumatic.  Eyes: Pupils are equal, round, and reactive to light. EOM are normal.  Neck: No tracheal deviation present. No thyromegaly present.  Cardiovascular: Normal rate.  Pulmonary/Chest: Effort normal. He has no wheezes.  Abdominal: Soft. Bowel sounds are normal.  Neurological: He is alert and oriented to person, place, and time.  Skin: Skin is warm and dry. Capillary refill takes less than 2 seconds.  Psychiatric: He has a normal mood and affect. His behavior is normal. Judgment and thought content normal.    Ortho Exam some pain with brachial plexus palpation.  Mild thenar atrophy on the right negative on the left.  Some pain with carpal compression.  Right BKA prosthesis.  Specialty Comments:  No specialty comments available.  Imaging: No results found.   PMFS History: Patient Active Problem List   Diagnosis Date Noted  . S/P BKA (below knee amputation), right ([place]) 07/30/2016  . Constipation due to opioid therapy 07/30/2016  . Anemia   . Right foot infection   . Gangrene of right foot ([place])  07/20/2016  . SIAD (syndrome of inappropriate antidiuresis) ([place]) 07/20/2016  . Benign essential HTN 07/20/2016  . Osteomyelitis ([place]) 07/10/2016  . Hypokalemia 07/10/2016  . Pain and swelling of right lower leg 07/10/2016  . Alcohol use 07/10/2016  . Polysubstance abuse ([place]) 07/10/2016  . Tobacco use disorder 07/10/2016  . Cellulitis and abscess of toe of right foot 07/10/2016   Past Medical History:  Diagnosis Date  . Arthritis   . Hypertension   . PAD (peripheral artery disease) ([place])     History reviewed. No pertinent family history.  Past Surgical History:  Procedure Laterality Date  . ABDOMINAL AORTOGRAM N/A 07/23/2016   Procedure: Abdominal Aortogram;   Surgeon: Elam Dutch, MD;  Location: [facility];  Service: Cardiovascular;  Laterality: N/A;  . AMPUTATION Right 07/27/2016   Procedure: RIGHT BELOW KNEE AMPUTATION;  Surgeon: Waynetta Sandy, MD;  Location: [facility];  Service: Vascular;  Laterality: Right;  . CERVICAL FUSION    . LOWER EXTREMITY ANGIOGRAPHY Right 07/23/2016   Procedure: Lower Extremity Angiography;  Surgeon: Elam Dutch, MD;  Location: [facility];  Service: Cardiovascular;  Laterality: Right;  . PERIPHERAL VASCULAR INTERVENTION Right 07/23/2016   Procedure: Peripheral Vascular Intervention;  Surgeon: Elam Dutch, MD;  Location: [facility];  Service: Cardiovascular;  Laterality: Right;  SFA    Social History   Occupational History  . Not on file  Tobacco Use  . Smoking status: Current Every Day Smoker    Packs/day: 0.25    Types: Cigarettes  . Smokeless tobacco: Never Used  Substance and Sexual Activity  . Alcohol use: Yes    Comment: occassionally  . Drug use: Yes    Types: "Crack" cocaine, Other-see comments    Comment: percocet   . Sexual activity: Never

## 2018-03-28 ENCOUNTER — Encounter (INDEPENDENT_AMBULATORY_CARE_PROVIDER_SITE_OTHER): Payer: Self-pay | Admitting: Orthopaedic Surgery

## 2018-03-28 ENCOUNTER — Ambulatory Visit (INDEPENDENT_AMBULATORY_CARE_PROVIDER_SITE_OTHER): Payer: Medicare HMO | Admitting: Orthopaedic Surgery

## 2018-03-28 VITALS — BP 147/94 | HR 85 | Ht 67.0 in | Wt 175.0 lb

## 2018-03-28 DIAGNOSIS — G5601 Carpal tunnel syndrome, right upper limb: Secondary | ICD-10-CM | POA: Diagnosis not present

## 2018-03-28 NOTE — Progress Notes (Signed)
Office Visit Note   Patient: Calvin Mckee           Date of Birth: [birth year]           MRN: [id number] Visit Date: 03/28/2018              Requested by: No referring provider defined for this encounter. PCP: Patient, No Pcp Per   Assessment & Plan: Visit Diagnoses:  1. Carpal tunnel syndrome, right upper limb     Plan: Due to patient persistent symptoms we will proceed with right carpal tunnel release.  He understands that he may not get complete relief with this peripheral neuropathy.  Outpatient surgery with local anesthesia and IV sedation discussed.  He understands request we proceed.  Follow-Up Instructions: No follow-ups on file.   Orders:  No orders of the defined types were placed in this encounter.  No orders of the defined types were placed in this encounter.     Procedures: No procedures performed   Clinical Data: No additional findings.   Subjective: Chief Complaint  Patient presents with  . Right Hand - Follow-up  . Left Hand - Follow-up    HPI 67 year old male returns still wearing chronic wrist splints on the right side due to continued numbness radial 3-1/2 fingers.  He had carpal tunnel injection did well for several days and then after week he had recurrence of symptoms exactly as before.  Symptoms are worse on the right hand and left hand.  Lexical tests were abnormal and showed sensorimotor peripheral neuropathy unable to rule out carpal tunnel compression.  Review of Systems 14 point update unchanged from 01/04/2018.  History of substance abuse polysubstance and with any surgery wants to avoid narcotics.   Objective: Vital Signs: BP (!) 147/94 (BP Location: Left Arm, Patient Position: Sitting)   Pulse 85   Ht 5\' 7"  (1.702 m)   Wt 175 lb (79.4 kg)   BMI 27.41 kg/m   Physical Exam Constitutional:      Appearance: He is well-developed.  HENT:     Head: Normocephalic and atraumatic.  Eyes:     Pupils: Pupils are equal, round, and  reactive to light.  Neck:     Thyroid: No thyromegaly.     Trachea: No tracheal deviation.  Cardiovascular:     Rate and Rhythm: Normal rate.  Pulmonary:     Effort: Pulmonary effort is normal.     Breath sounds: No wheezing.  Abdominal:     General: Bowel sounds are normal.     Palpations: Abdomen is soft.  Skin:    General: Skin is warm and dry.     Capillary Refill: Capillary refill takes less than 2 seconds.  Neurological:     Mental Status: He is alert and oriented to person, place, and time.  Psychiatric:        Behavior: Behavior normal.        Thought Content: Thought content normal.        Judgment: Judgment normal.     Ortho Exam slight thenar atrophy right and left.  Positive Phalen's test right greater than left wrist.  Positive carpal compression test on the right mildly positive on the left.  Specialty Comments:  No specialty comments available.  Imaging: Interpretation Summary   EMG & NCV Findings: Evaluation of the left median motor and the right median motor nerves showed decreased conduction velocity (Elbow-Wrist, L43, R41 m/s).  The left ulnar motor and the right ulnar motor nerves  showed decreased conduction velocity (B Elbow-Wrist, L48, R45 m/s).  The left median (across palm) sensory nerve showed prolonged distal peak latency (Wrist, 3.9 ms) and prolonged distal peak latency (Palm, 3.8 ms).  The right median (across palm) sensory nerve showed no response (Palm) and prolonged distal peak latency (4.1 ms).  The left ulnar sensory nerve showed prolonged distal peak latency (4.1 ms), reduced amplitude (14.0 V), and decreased conduction velocity (Wrist-5th Digit, 34 m/s).  The right ulnar sensory nerve showed prolonged distal peak latency (3.8 ms) and decreased conduction velocity (Wrist-5th Digit, 37 m/s).  All remaining nerves (as indicated in the following tables) were within normal limits.  All left vs. right side differences were within normal limits.    All  examined muscles (as indicated in the following table) showed no evidence of electrical instability.    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 Patient has no history of diabetes but does have history of polysubstance abuse. **cannot rule out concomitant bilateral carpal tunnel entrapment.    There is no significant electrodiagnostic evidence of any other focal nerve entrapment, brachial plexopathy or cervical radiculopathy.   Recommendations: 1.  Follow-up with referring physician. 2.  Continue current management of symptoms.  Carpal tunnel injection may be diagnostically helpful.  ___________________________ Laurence Spates [facility] Board Certified, American Board of Physical Medicine and Rehabilitation   Nerve Conduction Studies Anti Sensory Summary Table   Stim Site NR Peak (ms) Norm Peak (ms) P-T Amp (V) Norm P-T Amp Site1 Site2 Delta-P (ms) Dist (cm) Vel (m/s) Norm Vel (m/s)  Left Median Acr Palm Anti Sensory (2nd Digit)  32C  Wrist    *3.9 <3.6 15.5 >10 Wrist Palm 0.1 0.0    Palm    *3.8 <2.0 1.0         Right Median Acr Palm Anti Sensory (2nd Digit)  31.7C  Wrist    *4.1 <3.6 14.2 >10 Wrist Palm  0.0    Palm *NR  <2.0          Left Radial Anti Sensory (Base 1st Digit)  32.2C  Wrist    2.7 <3.1 20.2  Wrist Base 1st Digit 2.7 0.0    Right Radial Anti Sensory (Base 1st Digit)  31.3C  Wrist    2.8 <3.1 10.4  Wrist Base 1st Digit 2.8 0.0    Left Ulnar Anti Sensory (5th Digit)  32.1C  Wrist    *4.1 <3.7 *14.0 >15.0 Wrist 5th Digit 4.1 14.0 *34 >38  Right Ulnar Anti Sensory (5th Digit)  31.6C  Wrist    *3.8 <3.7 17.4 >15.0 Wrist 5th Digit 3.8 14.0 *37 >38   Motor Summary Table   Stim Site NR Onset (ms) Norm Onset (ms) O-P Amp (mV) Norm O-P Amp Site1 Site2 Delta-0 (ms) Dist (cm) Vel (m/s) Norm Vel (m/s)  Left  Median Motor (Abd Poll Brev)  32.2C  Wrist    3.7 <4.2 9.2 >5 Elbow Wrist 5.1 22.0 *43 >50  Elbow    8.8  8.5         Right Median Motor (Abd Poll Brev)  31.6C  Wrist    4.0 <4.2 8.6 >5 Elbow Wrist 5.4 22.2 *41 >50  Elbow    9.4  7.9         Left Ulnar Motor (Abd Dig Min)  32.3C  Wrist    3.4 <4.2 8.5 >3 B Elbow Wrist 4.6 22.0 *48 >  53  B Elbow    8.0  9.4  A Elbow B Elbow 1.9 10.0 53 >53  A Elbow    9.9  8.5         Right Ulnar Motor (Abd Dig Min)  32C  Wrist    3.3 <4.2 10.0 >3 B Elbow Wrist 4.9 22.0 *45 >53  B Elbow    8.2  8.4  A Elbow B Elbow 1.8 10.5 58 >53  A Elbow    10.0  8.7          EMG   Side Muscle Nerve Root Ins Act Fibs Psw Amp Dur Poly Recrt Int Fraser Din Comment  Right 1stDorInt Ulnar C8-T1 Nml Nml Nml Nml Nml 0 Nml Nml   Right Abd Poll Brev Median C8-T1 Nml Nml Nml Nml Nml 0 Nml Nml   Right ExtDigCom   Nml Nml Nml Nml Nml 0 Nml Nml   Right Triceps Radial C6-7-8 Nml Nml Nml Nml Nml 0 Nml Nml   Right Deltoid Axillary C5-6 Nml Nml Nml Nml Nml 0 Nml Nml     Nerve Conduction Studies Anti Sensory Left/Right Comparison   Stim Site L Lat (ms) R Lat (ms) L-R Lat (ms) L Amp (V) R Amp (V) L-R Amp (%) Site1 Site2 L Vel (m/s) R Vel (m/s) L-R Vel (m/s)  Median Acr Palm Anti Sensory (2nd Digit)  32C  Wrist *3.9 *4.1 0.2 15.5 14.2 8.4 Wrist Palm     Palm *3.8   1.0         Radial Anti Sensory (Base 1st Digit)  32.2C  Wrist 2.7 2.8 0.1 20.2 10.4 48.5 Wrist Base 1st Digit     Ulnar Anti Sensory (5th Digit)  32.1C  Wrist *4.1 *3.8 0.3 *14.0 17.4 19.5 Wrist 5th Digit *34 *37 3   Motor Left/Right Comparison   Stim Site L Lat (ms) R Lat (ms) L-R Lat (ms) L Amp (mV) R Amp (mV) L-R Amp (%) Site1 Site2 L Vel (m/s) R Vel (m/s) L-R Vel (m/s)  Median Motor (Abd Poll Brev)  32.2C  Wrist 3.7 4.0 0.3 9.2 8.6 6.5 Elbow Wrist *43 *41 2  Elbow 8.8 9.4 0.6 8.5 7.9 7.1       Ulnar Motor (Abd Dig Min)  32.3C  Wrist 3.4  3.3 0.1 8.5 10.0 15.0 B Elbow Wrist *48 *45 3  B Elbow 8.0 8.2 0.2 9.4 8.4 10.6 A Elbow B Elbow 53 58 5  A Elbow 9.9 10.0 0.1 8.5 8.7 2.3         Waveforms:                       PMFS History: Patient Active Problem List   Diagnosis Date Noted  . Bilateral hand numbness 02/14/2018  . S/P BKA (below knee amputation), right ([place]) 07/30/2016  . Constipation due to opioid therapy 07/30/2016  . Anemia   . Right foot infection   . Gangrene of right foot ([place]) 07/20/2016  . SIAD (syndrome of inappropriate antidiuresis) ([place]) 07/20/2016  . Benign essential HTN 07/20/2016  . Osteomyelitis ([place]) 07/10/2016  . Hypokalemia 07/10/2016  . Pain and swelling of right lower leg 07/10/2016  . Alcohol use 07/10/2016  . Polysubstance abuse ([place]) 07/10/2016  . Tobacco use disorder 07/10/2016  . Cellulitis and abscess of toe of right foot 07/10/2016   Past Medical History:  Diagnosis Date  . Arthritis   . Hypertension   . PAD (peripheral artery disease) ([place])  No family history on file.  Past Surgical History:  Procedure Laterality Date  . ABDOMINAL AORTOGRAM N/A 07/23/2016   Procedure: Abdominal Aortogram;  Surgeon: Elam Dutch, MD;  Location: [place] [facility];  Service: Cardiovascular;  Laterality: N/A;  . AMPUTATION Right 07/27/2016   Procedure: RIGHT BELOW KNEE AMPUTATION;  Surgeon: Waynetta Sandy, MD;  Location: [facility];  Service: Vascular;  Laterality: Right;  . CERVICAL FUSION    . LOWER EXTREMITY ANGIOGRAPHY Right 07/23/2016   Procedure: Lower Extremity Angiography;  Surgeon: Elam Dutch, MD;  Location: [place] [facility];  Service: Cardiovascular;  Laterality: Right;  . PERIPHERAL VASCULAR INTERVENTION Right 07/23/2016   Procedure: Peripheral Vascular Intervention;  Surgeon: Elam Dutch, MD;  Location: [place] [facility];  Service: Cardiovascular;  Laterality: Right;  SFA    Social History   Occupational History  . Not on file  Tobacco  Use  . Smoking status: Current Every Day Smoker    Packs/day: 0.25    Types: Cigarettes  . Smokeless tobacco: Never Used  Substance and Sexual Activity  . Alcohol use: Yes    Comment: occassionally  . Drug use: Yes    Types: "Crack" cocaine, Other-see comments    Comment: percocet   . Sexual activity: Never

## 2018-04-26 ENCOUNTER — Telehealth: Payer: Self-pay | Admitting: *Deleted

## 2018-04-26 NOTE — Telephone Encounter (Signed)
Patient called to request a "letter" to his prosthetist at [facility] [phone]) for an upgrade on his foot prosthetic. I called Calvin Mckee at [facility] and she said that he recently got his standard prosthesis and that they do not need anything from this office. The patient is evidently confused in that any upgrade on foot appliance will be paid out of pocket due to insurance guidelines. I spoke with Calvin Mckee and told him about my discussion with [facility]; he voiced understanding of this information.

## 2018-05-08 ENCOUNTER — Other Ambulatory Visit: Payer: Self-pay

## 2018-05-08 ENCOUNTER — Emergency Department (HOSPITAL_COMMUNITY): Payer: Medicare HMO

## 2018-05-08 ENCOUNTER — Emergency Department (HOSPITAL_COMMUNITY)
Admission: EM | Admit: 2018-05-08 | Discharge: 2018-05-08 | Disposition: A | Discharge status: Home / Self Care | Payer: Medicare HMO | Attending: Emergency Medicine | Admitting: Emergency Medicine

## 2018-05-08 ENCOUNTER — Encounter (HOSPITAL_COMMUNITY): Payer: Self-pay | Admitting: Emergency Medicine

## 2018-05-08 DIAGNOSIS — Z79899 Other long term (current) drug therapy: Secondary | ICD-10-CM | POA: Insufficient documentation

## 2018-05-08 DIAGNOSIS — R05 Cough: Secondary | ICD-10-CM | POA: Diagnosis present

## 2018-05-08 DIAGNOSIS — F17210 Nicotine dependence, cigarettes, uncomplicated: Secondary | ICD-10-CM | POA: Diagnosis not present

## 2018-05-08 DIAGNOSIS — B9789 Other viral agents as the cause of diseases classified elsewhere: Secondary | ICD-10-CM

## 2018-05-08 DIAGNOSIS — N390 Urinary tract infection, site not specified: Secondary | ICD-10-CM | POA: Insufficient documentation

## 2018-05-08 DIAGNOSIS — I10 Essential (primary) hypertension: Secondary | ICD-10-CM | POA: Insufficient documentation

## 2018-05-08 DIAGNOSIS — J069 Acute upper respiratory infection, unspecified: Secondary | ICD-10-CM

## 2018-05-08 MED ORDER — BENZONATATE 100 MG PO CAPS: 100.0000 mg | ORAL_CAPSULE | Freq: Three times a day (TID) | ORAL | 0 refills | Status: DC

## 2018-05-08 MED ORDER — PREDNISONE 10 MG PO TABS: 40.0000 mg | ORAL_TABLET | Freq: Every day | ORAL | 0 refills | Status: AC

## 2018-05-08 MED ORDER — FLUTICASONE PROPIONATE 50 MCG/ACT NA SUSP: 1.0000 | Freq: Every day | NASAL | 2 refills | Status: AC

## 2018-05-08 NOTE — ED Provider Notes (Signed)
[facility] DEPT Provider Note   CSN: [id number] Arrival date & time: 05/08/18  1442     History   Chief Complaint Chief Complaint  Patient presents with  . Nasal Congestion  . Cough    HPI Calvin Mckee is a 68 y.o. male.  68 year old male presents with 9 days of cough and congestion.  Patient states cough is occasionally productive, denies wheezing, shortness of breath, fevers, chills.  Patient has been taking OTC medications with limited relief.  Patient states he is using a nasal spray, unsure which nasal spray, which provides very temporary relief.  Patient is a daily smoker, denies history of asthma or chronic lung disease.  Patient is requesting injection of penicillin today.  No other complaints or concerns.     Past Medical History:  Diagnosis Date  . Arthritis   . Hypertension   . PAD (peripheral artery disease) [facility])     Patient Active Problem List   Diagnosis Date Noted  . Bilateral hand numbness 02/14/2018  . S/P BKA (below knee amputation), right ([place]) 07/30/2016  . Constipation due to opioid therapy 07/30/2016  . Anemia   . Right foot infection   . Gangrene of right foot ([place]) 07/20/2016  . SIAD (syndrome of inappropriate antidiuresis) ([place]) 07/20/2016  . Benign essential HTN 07/20/2016  . Osteomyelitis ([place]) 07/10/2016  . Hypokalemia 07/10/2016  . Pain and swelling of right lower leg 07/10/2016  . Alcohol use 07/10/2016  . Polysubstance abuse ([place]) 07/10/2016  . Tobacco use disorder 07/10/2016  . Cellulitis and abscess of toe of right foot 07/10/2016    Past Surgical History:  Procedure Laterality Date  . ABDOMINAL AORTOGRAM N/A 07/23/2016   Procedure: Abdominal Aortogram;  Surgeon: Elam Dutch, MD;  Location: [place] [facility];  Service: Cardiovascular;  Laterality: N/A;  . AMPUTATION Right 07/27/2016   Procedure: RIGHT BELOW KNEE AMPUTATION;  Surgeon: Waynetta Sandy, MD;  Location: [place];   Service: Vascular;  Laterality: Right;  . CERVICAL FUSION    . LOWER EXTREMITY ANGIOGRAPHY Right 07/23/2016   Procedure: Lower Extremity Angiography;  Surgeon: Elam Dutch, MD;  Location: [place] [facility];  Service: Cardiovascular;  Laterality: Right;  . PERIPHERAL VASCULAR INTERVENTION Right 07/23/2016   Procedure: Peripheral Vascular Intervention;  Surgeon: Elam Dutch, MD;  Location: [place] [facility];  Service: Cardiovascular;  Laterality: Right;  SFA         Home Medications    Prior to Admission medications   Medication Sig Start Date End Date Taking? Authorizing Provider  acetaminophen-codeine (TYLENOL #3) 300-30 MG tablet TK 1 T PO  Q 6 H PRF PAIN 01/21/18   [provider]  amLODipine (NORVASC) 10 MG tablet Take 1 tablet (10 mg total) by mouth daily. Patient not taking: Reported on 12/01/2017 07/17/16   Florencia Reasons, MD  aspirin EC 81 MG EC tablet Take 1 tablet (81 mg total) by mouth daily. Patient not taking: Reported on 12/01/2017 07/30/16   Rosita Fire, MD  benzonatate (TESSALON) 100 MG capsule Take 1 capsule (100 mg total) by mouth every 8 (eight) hours. 05/08/18   Tacy Learn, PA-C  cholecalciferol (VITAMIN D) 1000 units tablet Take 1,000 Units by mouth 2 (two) times daily.    [provider]  clonazePAM (KLONOPIN) 0.5 MG tablet Take 0.5 tablets (0.25 mg total) by mouth every 8 (eight) hours as needed for anxiety. Patient not taking: Reported on 12/01/2017 07/30/16   Hollace Kinnier  L, DO  clopidogrel (PLAVIX) 75 MG tablet Take 1 tablet (75 mg total) by mouth daily. Patient not taking: Reported on 12/01/2017 07/30/16   Rosita Fire, MD  fluticasone [facility]) 50 MCG/ACT nasal spray Place 1 spray into both nostrils daily. 05/08/18   Tacy Learn, PA-C  gabapentin (NEURONTIN) 300 MG capsule Take 1 capsule (300 mg total) by mouth 2 (two) times daily. Patient not taking: Reported on 01/25/2018 12/27/17   Hedges, Dellis Filbert, PA-C    hydrochlorothiazide (HYDRODIURIL) 50 MG tablet Take 50 mg by mouth daily.    [provider]  ibuprofen (ADVIL,MOTRIN) 800 MG tablet Take 800 mg by mouth every 8 (eight) hours as needed.    [provider]  losartan (COZAAR) 50 MG tablet Take 50 mg by mouth daily.    [provider]  magnesium oxide (MAG-OX) 400 MG tablet Take 1 tablet (400 mg total) by mouth daily. Patient not taking: Reported on 12/01/2017 07/17/16   Florencia Reasons, MD  methocarbamol (ROBAXIN) 500 MG tablet Take 1 tablet (500 mg total) by mouth 2 (two) times daily. Patient not taking: Reported on 01/25/2018 12/27/17   Hedges, Dellis Filbert, PA-C  methylPREDNISolone (MEDROL) 2 MG tablet 6-day taper to be taken as directed Patient not taking: Reported on 01/04/2018 12/01/17   Lanae Crumbly, PA-C  Multiple Vitamin (MULTIVITAMIN WITH MINERALS) TABS tablet Take 1 tablet by mouth daily.    [provider]  nystatin (MYCOSTATIN/NYSTOP) powder Apply to perineal area topically daily    [provider]  omeprazole (PRILOSEC) 10 MG capsule Take 10 mg by mouth daily.    [provider]  oxyCODONE-acetaminophen (PERCOCET/ROXICET) 5-325 MG tablet Take 1-2 tablets by mouth every 6 (six) hours as needed for severe pain.    [provider]  polyethylene glycol (MIRALAX / GLYCOLAX) packet Take 17 g by mouth 2 (two) times daily. Patient not taking: Reported on 12/01/2017 07/17/16   Florencia Reasons, MD  predniSONE (DELTASONE) 10 MG tablet Take 4 tablets (40 mg total) by mouth daily for 5 days. 05/08/18 05/13/18  Tacy Learn, PA-C  saccharomyces boulardii (FLORASTOR) 250 MG capsule Take 250 mg by mouth 2 (two) times daily.    [provider]  senna-docusate (SENOKOT-S) 8.6-50 MG tablet Take 1 tablet by mouth at bedtime. Patient not taking: Reported on 12/01/2017 07/17/16   Florencia Reasons, MD  sodium chloride 1 g tablet Take 1 g by mouth 2 (two) times daily with a meal.    [provider]  thiamine 100  MG tablet Take 1 tablet (100 mg total) by mouth daily. Patient not taking: Reported on 12/01/2017 07/17/16   Florencia Reasons, MD    Family History No family history on file.  Social History Social History   Tobacco Use  . Smoking status: Current Every Day Smoker    Packs/day: 0.25    Types: Cigarettes  . Smokeless tobacco: Never Used  Substance Use Topics  . Alcohol use: Yes    Comment: occassionally  . Drug use: Yes    Types: "Crack" cocaine, Other-see comments    Comment: percocet      Allergies   No known allergies   Review of Systems Review of Systems  Constitutional: Negative for chills and fever.  HENT: Positive for congestion. Negative for postnasal drip, rhinorrhea, sinus pressure, sinus pain, sneezing and sore throat.   Eyes: Negative for discharge and redness.  Respiratory: Positive for cough. Negative for shortness of breath and wheezing.   Musculoskeletal: Negative  for arthralgias and myalgias.  Skin: Negative for rash and wound.  Allergic/Immunologic: Negative for immunocompromised state.  Neurological: Negative for weakness and headaches.  Hematological: Negative for adenopathy. Does not bruise/bleed easily.  Psychiatric/Behavioral: Negative for confusion.  All other systems reviewed and are negative.    Physical Exam Updated Vital Signs BP (!) 156/97 (BP Location: Right Arm)   Pulse 85   Temp 98.1 F (36.7 C) (Oral)   Resp 18   SpO2 98%   Physical Exam Vitals signs and nursing note reviewed.  Constitutional:      General: He is not in acute distress.    Appearance: He is well-developed. He is not ill-appearing or toxic-appearing.  HENT:     Head: Normocephalic and atraumatic.     Right Ear: Hearing, tympanic membrane and ear canal normal. No middle ear effusion.     Left Ear: Hearing, tympanic membrane and ear canal normal.  No middle ear effusion.     Nose: Nose normal. No congestion.     Mouth/Throat:     Pharynx: Uvula midline. No oropharyngeal  exudate, posterior oropharyngeal erythema or uvula swelling.  Eyes:     Pupils: Pupils are equal, round, and reactive to light.  Neck:     Musculoskeletal: Neck supple.  Cardiovascular:     Rate and Rhythm: Normal rate.     Pulses: Normal pulses.     Heart sounds: Normal heart sounds. No murmur.  Pulmonary:     Effort: Pulmonary effort is normal.     Breath sounds: Normal breath sounds. No wheezing.  Lymphadenopathy:     Cervical: No cervical adenopathy.  Skin:    General: Skin is warm and dry.     Findings: No rash.  Neurological:     Mental Status: He is alert and oriented to person, place, and time.  Psychiatric:        Behavior: Behavior normal.      ED Treatments / Results  Labs (all labs ordered are listed, but only abnormal results are displayed) Labs Reviewed - No data to display  EKG None  Radiology Dg Chest 2 View  Result Date: 05/08/2018 CLINICAL DATA:  Productive cough EXAM: CHEST - 2 VIEW COMPARISON:  07/14/2016 FINDINGS: The heart size and mediastinal contours are within normal limits. Both lungs are clear. The visualized skeletal structures are unremarkable. IMPRESSION: No active cardiopulmonary disease. Electronically Signed   By: Kathreen Devoid   On: 05/08/2018 16:03    Procedures Procedures (including critical care time)  Medications Ordered in ED Medications - No data to display   Initial Impression / Assessment and Plan / ED Course  I have reviewed the triage vital signs and the nursing notes.  Pertinent labs & imaging results that were available during my care of the patient were reviewed by me and considered in my medical decision making (see chart for details).  Clinical Course as of May 09 1747  Mon May 09, 6763  1761 68 year old male with cough and congestion x9 days.  On exam patient is well-appearing, lung sounds are clear.  Patient is a daily smoker, discussed possible COPD related cough.  Patient reports taking OTC nasal spray which  gives very limited relief of his nasal congestion.  Advised patient to discontinue use, possible Afrin.  Patient advised to use Flonase, given 5-day course of prednisone.  Chest x-ray ordered in triage is negative for pneumonia.  Patient should recheck with his PCP, return to ER for worsening or concerning symptoms.   [  LM]    Clinical Course User Index [LM] Tacy Learn, PA-C   Final Clinical Impressions(s) / ED Diagnoses   Final diagnoses:  Viral URI with cough    ED Discharge Orders         Ordered    fluticasone (FLONASE) 50 MCG/ACT nasal spray  Daily     05/08/18 1553    predniSONE (DELTASONE) 10 MG tablet  Daily     05/08/18 1553    benzonatate (TESSALON) 100 MG capsule  Every 8 hours     05/08/18 1553           Tacy Learn, PA-C 05/08/18 1749    Sherwood Gambler, MD 05/08/18 2342

## 2018-05-08 NOTE — Discharge Instructions (Addendum)
Take prednisone as prescribed and complete the full course. Use Flonase daily as prescribed. Take Tessalon as needed as prescribed for cough. If you need additional symptom relief, you may take Coricidin HBP as directed.  This medication is safe to use for people who have high blood pressure.  Avoid other over-the-counter cold medications as these can make your blood pressure high. Recheck with your doctor.

## 2018-05-08 NOTE — ED Triage Notes (Signed)
Pt c/o cough and congestion x week. Reports needs a prescription

## 2018-05-10 ENCOUNTER — Telehealth (INDEPENDENT_AMBULATORY_CARE_PROVIDER_SITE_OTHER): Payer: Self-pay | Admitting: Orthopaedic Surgery

## 2018-05-10 NOTE — Telephone Encounter (Signed)
Note on surgery sheet to call patient and schedule right carpal tunnel release after New Year.  Patient states he's doing better, has done physical therapy exercises and trying to hold off on having surgery for now.  He says he will call when or if it gets worse.  He has name and number for scheduling.

## 2018-05-10 NOTE — Telephone Encounter (Signed)
fyi

## 2018-06-16 IMAGING — MR MR FOOT*R* W/O CM
4 of 6 series · 19 of 40 positions shown · non-contrast
Comparison: Radiographs of the right foot from 07/15/2016

CLINICAL DATA: Right foot pain worsening since [REDACTED]. Leg
started swelling and patient has been on unable to walk nor put any
pressure on it.

EXAM:
MRI OF THE RIGHT FOREFOOT WITHOUT CONTRAST
TECHNIQUE: Multiplanar, multisequence MR imaging of the right forefoot was
performed. No intravenous contrast was administered.

[Series 3: T1 · coronal · 4.0mm · 0.29mm/px · 8 of 30 slices shown (1 of 2)]
[im 1/30]
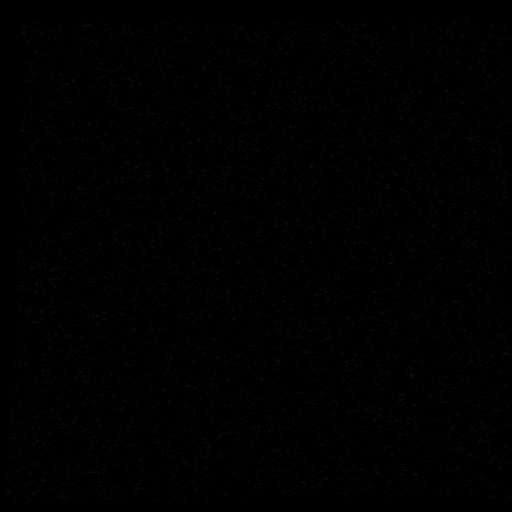
[im 5/30]
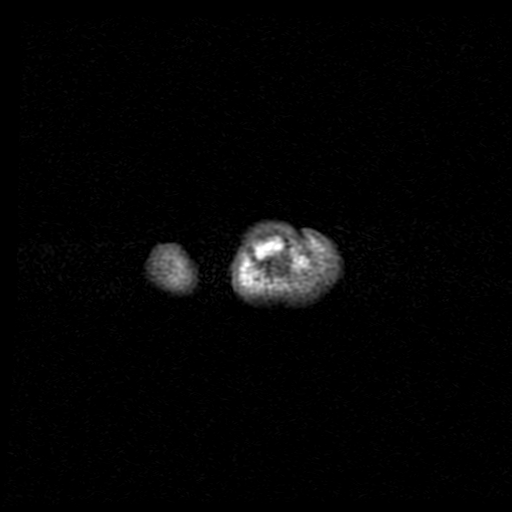
[im 9/30]
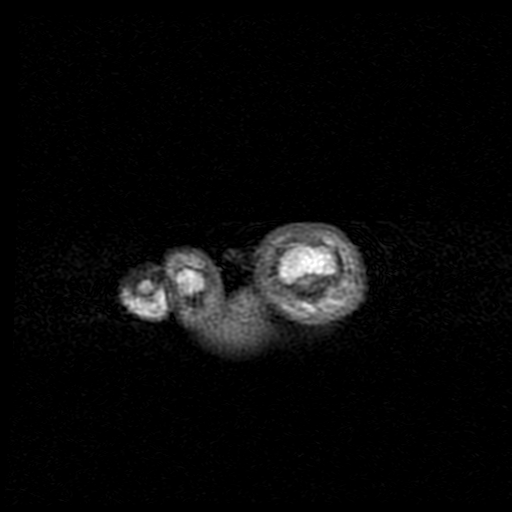
[im 13/30]
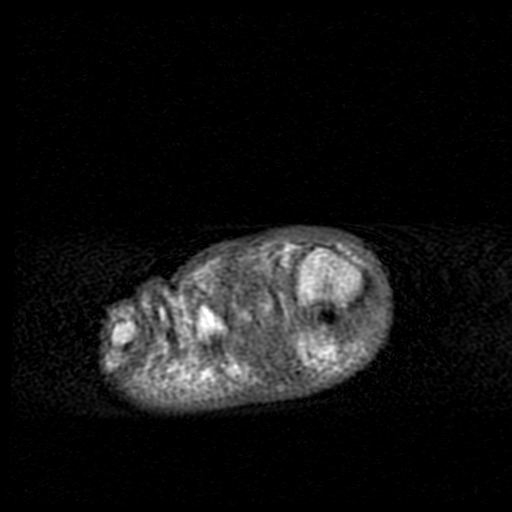
[im 17/30]
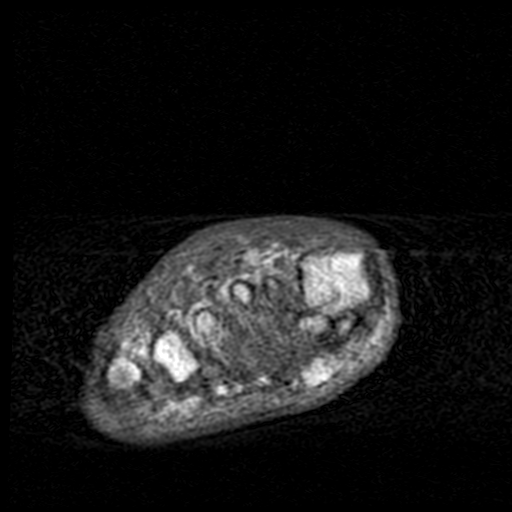
[im 21/30]
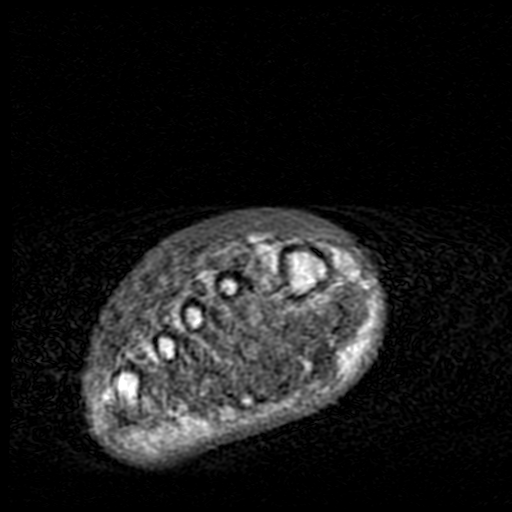
[im 25/30]
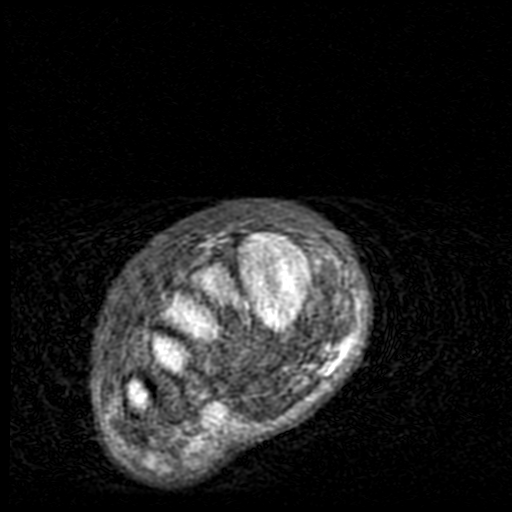
[im 30/30]
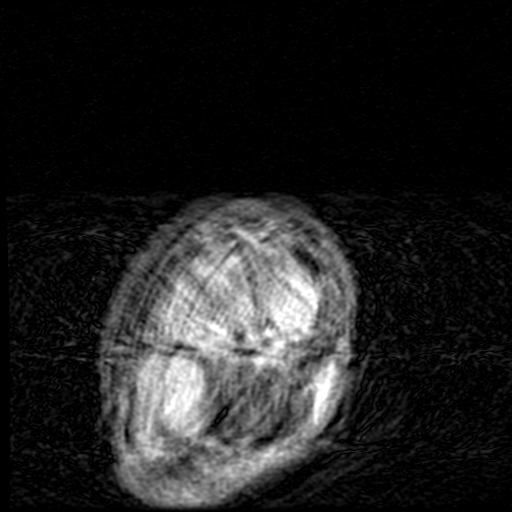

[Series 4: **fast ax t1fs · coronal · 4.0mm · 0.29mm/px · 3 of 30 slices shown]
[im 5/30]
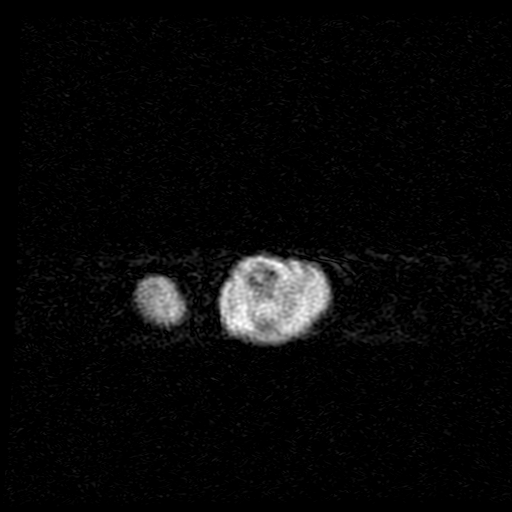
[im 15/30]
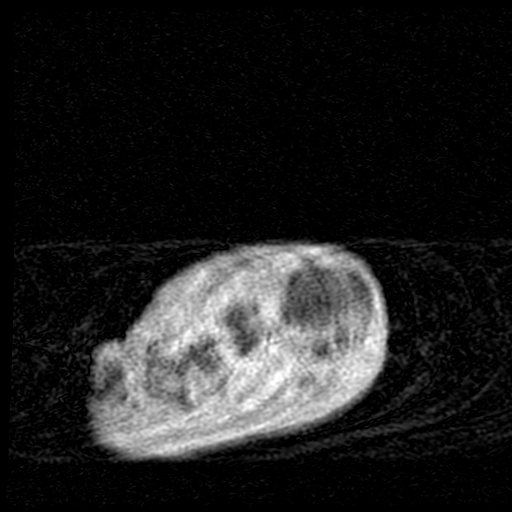
[im 25/30]
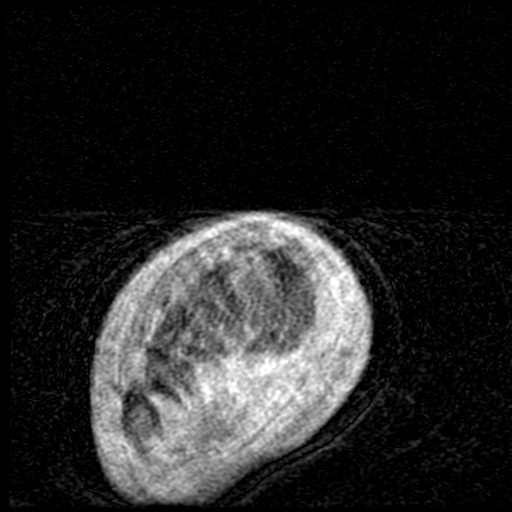

[Series 5: T2 · coronal · 4.0mm · 0.29mm/px · 3 of 30 slices shown]
[im 5/30]
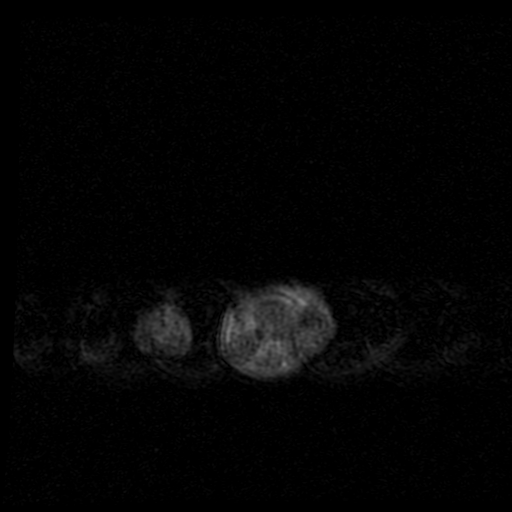
[im 15/30]
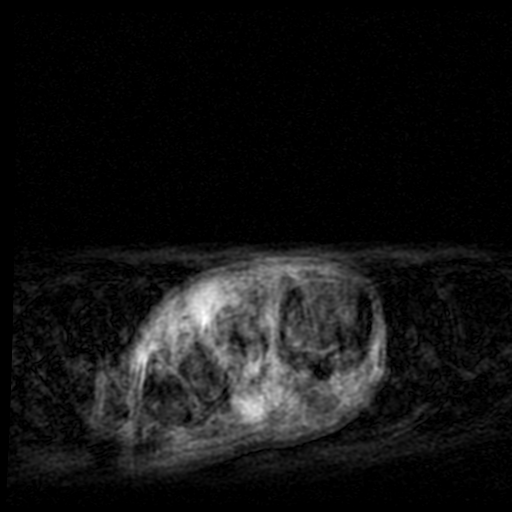
[im 25/30]
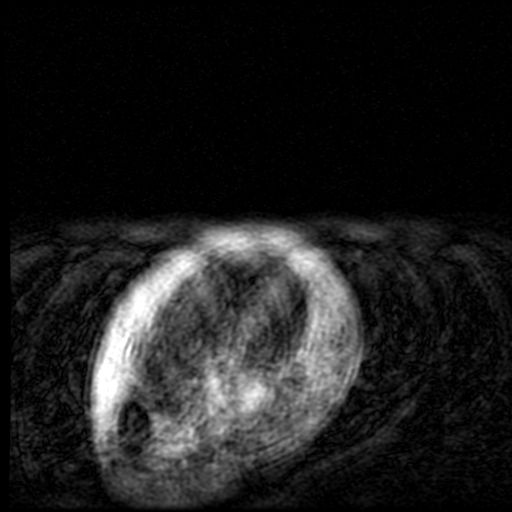

[Series 6: T1 · axial · 4.0mm · 0.31mm/px · z∈[-152,-66]mm · 5 of 20 slices shown (2 of 2)]
[im 1/20]
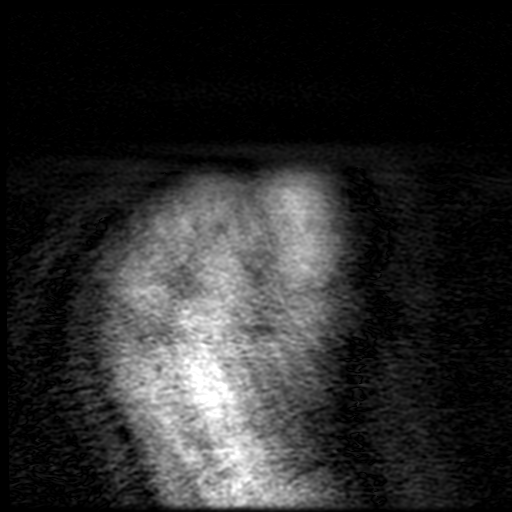
[im 5/20]
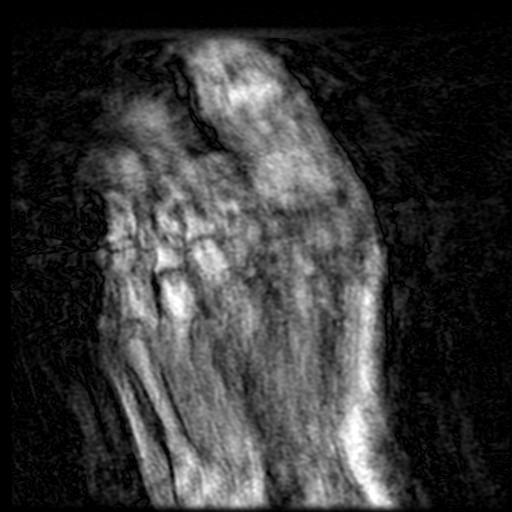
[im 10/20]
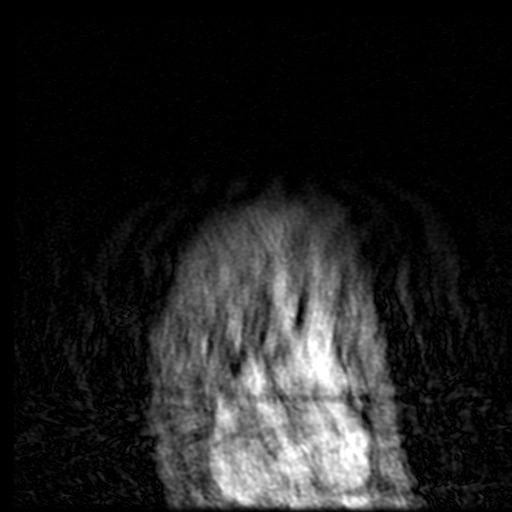
[im 15/20]
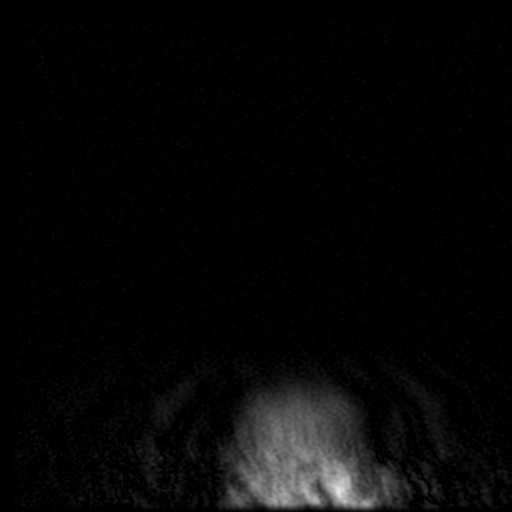
[im 20/20]
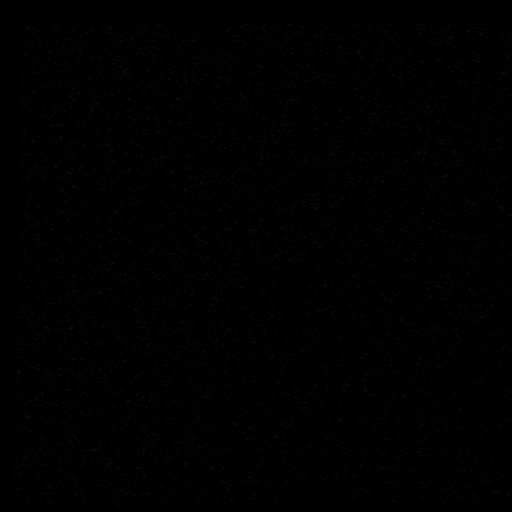

[19 of 40 positions shown; findings below may reference images not displayed]

FINDINGS: Despite patient having received the maximum amount of medication,
the study is markedly limited due to motion artifacts.

Bones/Joint/Cartilage

The patient is status post amputation at the base of the right
proximal phalanx with suggestion of T1 hypointensity in T2 fat sat
hyperintensity at site of amputation. Findings are suspicious for
osteomyelitis.

Ligaments

Suboptimally assessed

Muscles and Tendons

Suboptimally assessed

Soft tissues

Soft tissue edema about the forefoot special along the plantar
aspect of the first and second digits.
IMPRESSION: Markedly compromised study due to patient motion artifacts. Marrow
signal abnormalities at the amputation site of the base of the
second proximal phalanx with surrounding soft tissue
inflammation/edema raise concern for the presence of osteomyelitis
at the stump.

## 2019-03-01 ENCOUNTER — Encounter (INDEPENDENT_AMBULATORY_CARE_PROVIDER_SITE_OTHER): Payer: Self-pay | Admitting: Otolaryngology

## 2019-03-01 ENCOUNTER — Other Ambulatory Visit: Payer: Self-pay

## 2019-03-01 ENCOUNTER — Ambulatory Visit (INDEPENDENT_AMBULATORY_CARE_PROVIDER_SITE_OTHER): Payer: Medicare HMO | Admitting: Otolaryngology

## 2019-03-01 VITALS — Temp 97.9°F

## 2019-03-01 DIAGNOSIS — H9311 Tinnitus, right ear: Secondary | ICD-10-CM

## 2019-03-01 DIAGNOSIS — T161XXA Foreign body in right ear, initial encounter: Secondary | ICD-10-CM

## 2019-03-01 NOTE — Progress Notes (Signed)
HPI: Calvin Mckee is a 68 y.o. male who presents for evaluation of ringing in his right ear. Patient states his right ear has been buzzing for the past month. He also endorses some right ear itching. He denies any associated hearing loss or dizziness. He has been treated with Cortisporin drops, Ciprodex drops and Ofloxacin drops without benefit. He denies any ear pain or ear drainage. He is around loud noise often. He denies any trauma to the ear. No further complaints today.  Past Medical History:  Diagnosis Date  . Arthritis   . Hypertension   . PAD (peripheral artery disease) ([place])    Past Surgical History:  Procedure Laterality Date  . ABDOMINAL AORTOGRAM N/A 07/23/2016   Procedure: Abdominal Aortogram;  Surgeon: Elam Dutch, MD;  Location: [facility];  Service: Cardiovascular;  Laterality: N/A;  . AMPUTATION Right 07/27/2016   Procedure: RIGHT BELOW KNEE AMPUTATION;  Surgeon: Waynetta Sandy, MD;  Location: [place];  Service: Vascular;  Laterality: Right;  . CERVICAL FUSION    . LOWER EXTREMITY ANGIOGRAPHY Right 07/23/2016   Procedure: Lower Extremity Angiography;  Surgeon: Elam Dutch, MD;  Location: [facility];  Service: Cardiovascular;  Laterality: Right;  . PERIPHERAL VASCULAR INTERVENTION Right 07/23/2016   Procedure: Peripheral Vascular Intervention;  Surgeon: Elam Dutch, MD;  Location: [facility];  Service: Cardiovascular;  Laterality: Right;  SFA    Social History   Socioeconomic History  . Marital status: Single    Spouse name: Not on file  . Number of children: Not on file  . Years of education: Not on file  . Highest education level: Not on file  Occupational History  . Not on file  Social Needs  . Financial resource strain: Not on file  . Food insecurity    Worry: Not on file    Inability: Not on file  . Transportation needs    Medical: Not on file    Non-medical: Not on file  Tobacco Use  . Smoking status:  Former Smoker    Packs/day: 1.00    Years: 24.00    Pack years: 24.00    Types: Cigarettes    Start date: 86    Quit date: 06/2018    Years since quitting: 0.7  . Smokeless tobacco: Never Used  Substance and Sexual Activity  . Alcohol use: Yes    Comment: occassionally  . Drug use: Yes    Types: "Crack" cocaine, Other-see comments    Comment: percocet   . Sexual activity: Never  Lifestyle  . Physical activity    Days per week: Not on file    Minutes per session: Not on file  . Stress: Not on file  Relationships  . Social Herbalist on phone: Not on file    Gets together: Not on file    Attends religious service: Not on file    Active member of club or organization: Not on file    Attends meetings of clubs or organizations: Not on file    Relationship status: Not on file  Other Topics Concern  . Not on file  Social History Narrative  . Not on file   No family history on file. Allergies  Allergen Reactions  . No Known Allergies    Prior to Admission medications   Medication Sig Start Date End Date Taking? Authorizing Provider  acetaminophen-codeine (TYLENOL #3) 300-30 MG tablet TK 1 T PO  Q 6  H PRF PAIN 01/21/18  Yes [provider]  amLODipine (NORVASC) 10 MG tablet Take 1 tablet (10 mg total) by mouth daily. 07/17/16  Yes Florencia Reasons, MD  aspirin EC 81 MG EC tablet Take 1 tablet (81 mg total) by mouth daily. 07/30/16  Yes Rosita Fire, MD  benzonatate (TESSALON) 100 MG capsule Take 1 capsule (100 mg total) by mouth every 8 (eight) hours. 05/08/18  Yes Tacy Learn, PA-C  cholecalciferol (VITAMIN D) 1000 units tablet Take 1,000 Units by mouth 2 (two) times daily.   Yes [provider]  clonazePAM (KLONOPIN) 0.5 MG tablet Take 0.5 tablets (0.25 mg total) by mouth every 8 (eight) hours as needed for anxiety. 07/30/16  Yes Reed, Tiffany L, DO  clopidogrel (PLAVIX) 75 MG tablet Take 1 tablet (75 mg total) by mouth daily. 07/30/16  Yes  Rosita Fire, MD  fluticasone [facility]) 50 MCG/ACT nasal spray Place 1 spray into both nostrils daily. 05/08/18  Yes Tacy Learn, PA-C  gabapentin (NEURONTIN) 300 MG capsule Take 1 capsule (300 mg total) by mouth 2 (two) times daily. 12/27/17  Yes Hedges, Dellis Filbert, PA-C  hydrochlorothiazide (HYDRODIURIL) 50 MG tablet Take 50 mg by mouth daily.   Yes [provider]  ibuprofen (ADVIL,MOTRIN) 800 MG tablet Take 800 mg by mouth every 8 (eight) hours as needed.   Yes [provider]  losartan (COZAAR) 50 MG tablet Take 50 mg by mouth daily.   Yes [provider]  magnesium oxide (MAG-OX) 400 MG tablet Take 1 tablet (400 mg total) by mouth daily. 07/17/16  Yes Florencia Reasons, MD  methocarbamol (ROBAXIN) 500 MG tablet Take 1 tablet (500 mg total) by mouth 2 (two) times daily. 12/27/17  Yes Hedges, Dellis Filbert, PA-C  methylPREDNISolone (MEDROL) 2 MG tablet 6-day taper to be taken as directed 12/01/17  Yes Lanae Crumbly, PA-C  Multiple Vitamin (MULTIVITAMIN WITH MINERALS) TABS tablet Take 1 tablet by mouth daily.   Yes [provider]  nystatin (MYCOSTATIN/NYSTOP) powder Apply to perineal area topically daily   Yes [provider]  omeprazole (PRILOSEC) 10 MG capsule Take 10 mg by mouth daily.   Yes [provider]  oxyCODONE-acetaminophen (PERCOCET/ROXICET) 5-325 MG tablet Take 1-2 tablets by mouth every 6 (six) hours as needed for severe pain.   Yes [provider]  polyethylene glycol (MIRALAX / GLYCOLAX) packet Take 17 g by mouth 2 (two) times daily. 07/17/16  Yes Florencia Reasons, MD  saccharomyces boulardii (FLORASTOR) 250 MG capsule Take 250 mg by mouth 2 (two) times daily.   Yes [provider]  senna-docusate (SENOKOT-S) 8.6-50 MG tablet Take 1 tablet by mouth at bedtime. 07/17/16  Yes Florencia Reasons, MD  sodium chloride 1 g tablet Take 1 g by mouth 2 (two) times daily with a meal.   Yes [provider]  thiamine 100 MG tablet Take 1  tablet (100 mg total) by mouth daily. 07/17/16  Yes Florencia Reasons, MD     Positive ROS: otherwise negative  All other systems have been reviewed and were otherwise negative with the exception of those mentioned in the HPI and as above.  Physical Exam: Constitutional: Alert, well-appearing, no acute distress Ears: External ears without lesions or tenderness. Left ear canal is clear with a clear, intact TM. Right ear canal has a white piece of tissue paper obstructing the canal. After removing, the ear canal has dried drops covering it but no inflammation, drainage or infection. TM is clear and  intact. Tuning forks reveal subjectively normal hearing at 1024 and 512 Hz with AC>BC bilaterally. Nasal: External nose without lesions. Septum with minimal deformity. Clear septum. Oral: Lips and gums without lesions. Tongue and palate mucosa without lesions. Posterior oropharynx clear. Neck: No palpable adenopathy or masses Respiratory: Breathing comfortably  Skin: No facial/neck lesions or rash noted.  Removal of ear foreign body  Date/Time: 03/01/2019 2:05 PM Performed by: Lucilla Edin, PA-C Authorized by: Rozetta Nunnery, MD   Consent:    Consent obtained:  Verbal   Consent given by:  Patient Location:    Location:  Ear   Ear location:  R ear Procedure details:    Localization method:  Microscope   Ear: suction.   Procedure complexity:  Simple   Foreign bodies recovered:  1   Description:  Piece of tissue paper   Intact foreign body removal: yes   Post-procedure details:    Confirmation:  No additional foreign bodies on visualization   Patient tolerance of procedure:  Tolerated well, no immediate complications    Assessment: Tinnitus, right ear Foreign body, right ear  Plan: Tissue paper removed from right ear with improvement in symptoms. Discussed with patient etiology of ringing. Recommend he get a hearing test at his convenience. Also recommended ear protection  around loud noises and masking noises at night. Provided samples for Lipo-flavonoid. He will follow up PRN.  Christin Hoffstadt, PA-C   I agree with the assessment and plan as outlined above. Radene Journey, MD

## 2019-06-19 ENCOUNTER — Ambulatory Visit (INDEPENDENT_AMBULATORY_CARE_PROVIDER_SITE_OTHER): Payer: Medicare HMO | Admitting: Orthopaedic Surgery

## 2019-06-19 ENCOUNTER — Other Ambulatory Visit: Payer: Self-pay

## 2019-06-19 ENCOUNTER — Encounter: Payer: Self-pay | Admitting: Orthopaedic Surgery

## 2019-06-19 ENCOUNTER — Ambulatory Visit (INDEPENDENT_AMBULATORY_CARE_PROVIDER_SITE_OTHER): Payer: Medicare HMO

## 2019-06-19 VITALS — BP 138/82 | HR 73 | Ht 68.5 in | Wt 180.0 lb

## 2019-06-19 DIAGNOSIS — M25532 Pain in left wrist: Secondary | ICD-10-CM

## 2019-06-19 NOTE — Progress Notes (Signed)
Office Visit Note   Patient: Calvin Mckee           Date of Birth: [birth year]           MRN: [id number] Visit Date: 06/19/2019              Requested by: No referring provider defined for this encounter. PCP: Patient, No Pcp Per   Assessment & Plan: Visit Diagnoses:  1. Pain in left wrist     Plan: Patient continue to use the splint intermittently with activities. We again reviewed his previous electrical test. He understands the might get some improvement from carpal tunnel release. His pain does not wake him up at night he does not have to shake his hand with activities does not seem to bother him when his hands in the driving position. Reviewed the x-rays which showed normal bone architecture. He can return if he gets increased symptoms.  Follow-Up Instructions: No follow-ups on file.   Orders:  Orders Placed This Encounter  Procedures  . XR Wrist Complete Left   No orders of the defined types were placed in this encounter.     Procedures: No procedures performed   Clinical Data: No additional findings.   Subjective: Chief Complaint  Patient presents with  . Left Wrist - Pain    HPI six 69-year-old male with past history of polysubstance abuse is seen with left hand pain numbness and tingling pain that radiates into his forearm. He is used gabapentin for 90 days without relief he is used Tylenol occasionally. He wears a wrist splint about 40% of the time. Previous electrical test 01/25/2018 was abnormal and was consistent with sensorimotor peripheral neuropathy bilaterally. He did have a carpal tunnel injection 02/14/2018 which gave him some relief for a few days. Carpal tunnel release was discussed in the past but electrical test could not definitely determine if he had carpal tunnel or not due to the polyneuropathy changes.  Review of Systems   Objective: Vital Signs: BP 138/82   Pulse 73   Ht 5' 8.5" (1.74 m)   Wt 180 lb (81.6 kg)   BMI 26.97 kg/m    Physical Exam Constitutional:      Appearance: He is well-developed.  HENT:     Head: Normocephalic and atraumatic.  Eyes:     Pupils: Pupils are equal, round, and reactive to light.  Neck:     Thyroid: No thyromegaly.     Trachea: No tracheal deviation.  Cardiovascular:     Rate and Rhythm: Normal rate.  Pulmonary:     Effort: Pulmonary effort is normal.     Breath sounds: No wheezing.  Abdominal:     General: Bowel sounds are normal.     Palpations: Abdomen is soft.  Skin:    General: Skin is warm and dry.     Capillary Refill: Capillary refill takes less than 2 seconds.  Neurological:     Mental Status: He is alert and oriented to person, place, and time.  Psychiatric:        Behavior: Behavior normal.        Thought Content: Thought content normal.        Judgment: Judgment normal.     Ortho Exam patient has trace left hand hyperthenar atrophy some weakness of interossei slight weakness of flexors and extenders rated at trace. Positive carpal compression test right and left hand.  Specialty Comments:  No specialty comments available.  Imaging: No results found.  PMFS History: Patient Active Problem List   Diagnosis Date Noted  . Bilateral hand numbness 02/14/2018  . S/P BKA (below knee amputation), right ([place]) 07/30/2016  . Constipation due to opioid therapy 07/30/2016  . Anemia   . Right foot infection   . Gangrene of right foot ([place]) 07/20/2016  . 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 Benign essential HTN 07/20/2016  . Osteomyelitis ([place]) 07/10/2016  . Hypokalemia 07/10/2016  . Pain and swelling of right lower leg 07/10/2016  . Alcohol use 07/10/2016  . Polysubstance abuse ([place]) 07/10/2016  . Tobacco use disorder 07/10/2016  . Cellulitis and abscess of toe of right foot 07/10/2016   Past Medical History:  Diagnosis Date  . Arthritis   . Hypertension   . PAD (peripheral artery disease) ([facility])     No family history on  file.  Past Surgical History:  Procedure Laterality Date  . ABDOMINAL AORTOGRAM N/A 07/23/2016   Procedure: Abdominal Aortogram;  Surgeon: Elam Dutch, MD;  Location: [place] [facility];  Service: Cardiovascular;  Laterality: N/A;  . AMPUTATION Right 07/27/2016   Procedure: RIGHT BELOW KNEE AMPUTATION;  Surgeon: Waynetta Sandy, MD;  Location: [place];  Service: Vascular;  Laterality: Right;  . CERVICAL FUSION    . LOWER EXTREMITY ANGIOGRAPHY Right 07/23/2016   Procedure: Lower Extremity Angiography;  Surgeon: Elam Dutch, MD;  Location: [place] [facility];  Service: Cardiovascular;  Laterality: Right;  . PERIPHERAL VASCULAR INTERVENTION Right 07/23/2016   Procedure: Peripheral Vascular Intervention;  Surgeon: Elam Dutch, MD;  Location: [place] [facility];  Service: Cardiovascular;  Laterality: Right;  SFA    Social History   Occupational History  . Not on file  Tobacco Use  . Smoking status: Former Smoker    Packs/day: 1.00    Years: 24.00    Pack years: 24.00    Types: Cigarettes    Start date: 37    Quit date: 06/2018    Years since quitting: 1.0  . Smokeless tobacco: Never Used  Substance and Sexual Activity  . Alcohol use: Yes    Comment: occassionally  . Drug use: Yes    Types: "Crack" cocaine, Other-see comments    Comment: percocet   . Sexual activity: Never

## 2019-06-26 ENCOUNTER — Telehealth: Payer: Self-pay | Admitting: Orthopaedic Surgery

## 2019-06-26 NOTE — Telephone Encounter (Signed)
Received vm from patient wanting to get copy of his records. IC,lmvm. Advised he need to sign a records release form. Once we get that, we will call him when ready. [phone]

## 2019-07-11 ENCOUNTER — Telehealth: Payer: Self-pay | Admitting: Orthopaedic Surgery

## 2019-07-11 NOTE — Telephone Encounter (Signed)
Patient called checking status of his request for records. ic lmvm advised records mailed to him 3/23, need to allow extra time for delivery as is not as quick as it used to be.

## 2019-09-15 ENCOUNTER — Other Ambulatory Visit: Payer: Self-pay

## 2019-09-15 ENCOUNTER — Encounter (HOSPITAL_COMMUNITY): Payer: Self-pay | Admitting: Emergency Medicine

## 2019-09-15 ENCOUNTER — Emergency Department (HOSPITAL_COMMUNITY)
Admission: EM | Admit: 2019-09-15 | Discharge: 2019-09-15 | Disposition: A | Discharge status: Home / Self Care | Payer: No Typology Code available for payment source | Attending: Emergency Medicine | Admitting: Emergency Medicine

## 2019-09-15 DIAGNOSIS — Z7901 Long term (current) use of anticoagulants: Secondary | ICD-10-CM | POA: Diagnosis not present

## 2019-09-15 DIAGNOSIS — Z89511 Acquired absence of right leg below knee: Secondary | ICD-10-CM | POA: Diagnosis not present

## 2019-09-15 DIAGNOSIS — Z7982 Long term (current) use of aspirin: Secondary | ICD-10-CM | POA: Diagnosis not present

## 2019-09-15 DIAGNOSIS — I10 Essential (primary) hypertension: Secondary | ICD-10-CM | POA: Diagnosis not present

## 2019-09-15 DIAGNOSIS — H1013 Acute atopic conjunctivitis, bilateral: Secondary | ICD-10-CM | POA: Diagnosis not present

## 2019-09-15 DIAGNOSIS — Z87891 Personal history of nicotine dependence: Secondary | ICD-10-CM | POA: Insufficient documentation

## 2019-09-15 DIAGNOSIS — Z79899 Other long term (current) drug therapy: Secondary | ICD-10-CM | POA: Diagnosis not present

## 2019-09-15 DIAGNOSIS — H1131 Conjunctival hemorrhage, right eye: Secondary | ICD-10-CM | POA: Diagnosis present

## 2019-09-15 DIAGNOSIS — I739 Peripheral vascular disease, unspecified: Secondary | ICD-10-CM | POA: Insufficient documentation

## 2019-09-15 MED ORDER — TETRACAINE HCL 0.5 % OP SOLN
2.0000 [drp] | Freq: Once | OPHTHALMIC | Status: AC
  Administered 2019-09-15: 2 [drp] via OPHTHALMIC
  Filled 2019-09-15: qty 4

## 2019-09-15 MED ORDER — LORATADINE 10 MG PO TABS
10.0000 mg | ORAL_TABLET | Freq: Every day | ORAL | 0 refills | Status: DC
Start: 2019-09-15 — End: 2024-02-09

## 2019-09-15 MED ORDER — FLUORESCEIN SODIUM 1 MG OP STRP
1.0000 | ORAL_STRIP | Freq: Once | OPHTHALMIC | Status: AC
  Administered 2019-09-15: 1 via OPHTHALMIC
  Filled 2019-09-15: qty 1

## 2019-09-15 NOTE — ED Provider Notes (Signed)
[place] EMERGENCY DEPARTMENT Provider Note   CSN: [id number] Arrival date & time: 09/15/19  1638     History Chief Complaint  Patient presents with  . Eye Problem    Calvin Mckee is a 69 y.o. male.  Patient on Plavix presents emergency department for right eye redness.  Patient states that part of a pad that he was working with while painting 2 days ago slipped and struck his right eye.  He states that he got a splash of water in the eye as well.  He did not have any pain but noted yesterday morning that the middle part of his eye was red.  No vision changes.  No swelling around the eye itself.  No significant head injury reported.  No history of surgeries on the eye.  No pain with movement.  No treatments prior to arrival other than using eyedrops.  Prior to this, patient reports having itchy and watery eyes.  He has used Benadryl for those symptoms with temporary improvement.  He is requesting something for the tearing.        Past Medical History:  Diagnosis Date  . Arthritis   . Hypertension   . PAD (peripheral artery disease) [facility])     Patient Active Problem List   Diagnosis Date Noted  . Bilateral hand numbness 02/14/2018  . S/P BKA (below knee amputation), right ([place]) 07/30/2016  . Constipation due to opioid therapy 07/30/2016  . Anemia   . Right foot infection   . Gangrene of right foot ([place]) 07/20/2016  . SIAD (syndrome of inappropriate antidiuresis) ([place]) 07/20/2016  . Benign essential HTN 07/20/2016  . Osteomyelitis ([place]) 07/10/2016  . Hypokalemia 07/10/2016  . Pain and swelling of right lower leg 07/10/2016  . Alcohol use 07/10/2016  . Polysubstance abuse ([place]) 07/10/2016  . Tobacco use disorder 07/10/2016  . Cellulitis and abscess of toe of right foot 07/10/2016    Past Surgical History:  Procedure Laterality Date  . ABDOMINAL AORTOGRAM N/A 07/23/2016   Procedure: Abdominal Aortogram;  Surgeon: Elam Dutch, MD;  Location:  [place] [facility];  Service: Cardiovascular;  Laterality: N/A;  . AMPUTATION Right 07/27/2016   Procedure: RIGHT BELOW KNEE AMPUTATION;  Surgeon: Waynetta Sandy, MD;  Location: [place];  Service: Vascular;  Laterality: Right;  . CERVICAL FUSION    . LOWER EXTREMITY ANGIOGRAPHY Right 07/23/2016   Procedure: Lower Extremity Angiography;  Surgeon: Elam Dutch, MD;  Location: [place] [facility];  Service: Cardiovascular;  Laterality: Right;  . PERIPHERAL VASCULAR INTERVENTION Right 07/23/2016   Procedure: Peripheral Vascular Intervention;  Surgeon: Elam Dutch, MD;  Location: [place] [facility];  Service: Cardiovascular;  Laterality: Right;  SFA        No family history on file.  Social History   Tobacco Use  . Smoking status: Former Smoker    Packs/day: 1.00    Years: 24.00    Pack years: 24.00    Types: Cigarettes    Start date: 11    Quit date: 06/2018    Years since quitting: 1.2  . Smokeless tobacco: Never Used  Substance Use Topics  . Alcohol use: Yes    Comment: occassionally  . Drug use: Yes    Types: "Crack" cocaine, Other-see comments    Comment: percocet     Home Medications Prior to Admission medications   Medication Sig Start Date End Date Taking? Authorizing Provider  acetaminophen-codeine (TYLENOL #3) 300-30 MG tablet TK  1 T PO  Q 6 H PRF PAIN 01/21/18   [provider]  amLODipine (NORVASC) 10 MG tablet Take 1 tablet (10 mg total) by mouth daily. 07/17/16   Florencia Reasons, MD  aspirin EC 81 MG EC tablet Take 1 tablet (81 mg total) by mouth daily. 07/30/16   Rosita Fire, MD  benzonatate (TESSALON) 100 MG capsule Take 1 capsule (100 mg total) by mouth every 8 (eight) hours. 05/08/18   Tacy Learn, PA-C  cholecalciferol (VITAMIN D) 1000 units tablet Take 1,000 Units by mouth 2 (two) times daily.    [provider]  clonazePAM (KLONOPIN) 0.5 MG tablet Take 0.5 tablets (0.25 mg total) by mouth every 8 (eight) hours as needed  for anxiety. 07/30/16   Reed, Tiffany L, DO  clopidogrel (PLAVIX) 75 MG tablet Take 1 tablet (75 mg total) by mouth daily. 07/30/16   Rosita Fire, MD  fluticasone (FLONASE) 50 MCG/ACT nasal spray Place 1 spray into both nostrils daily. 05/08/18   Tacy Learn, PA-C  gabapentin (NEURONTIN) 300 MG capsule Take 1 capsule (300 mg total) by mouth 2 (two) times daily. 12/27/17   Hedges, Dellis Filbert, PA-C  hydrochlorothiazide (HYDRODIURIL) 50 MG tablet Take 50 mg by mouth daily.    [provider]  ibuprofen (ADVIL,MOTRIN) 800 MG tablet Take 800 mg by mouth every 8 (eight) hours as needed.    [provider]  losartan (COZAAR) 50 MG tablet Take 50 mg by mouth daily.    [provider]  magnesium oxide (MAG-OX) 400 MG tablet Take 1 tablet (400 mg total) by mouth daily. 07/17/16   Florencia Reasons, MD  methocarbamol (ROBAXIN) 500 MG tablet Take 1 tablet (500 mg total) by mouth 2 (two) times daily. Patient not taking: Reported on 06/19/2019 12/27/17   Hedges, Dellis Filbert, PA-C  methylPREDNISolone (MEDROL) 2 MG tablet 6-day taper to be taken as directed Patient not taking: Reported on 06/19/2019 12/01/17   Lanae Crumbly, PA-C  Multiple Vitamin (MULTIVITAMIN WITH MINERALS) TABS tablet Take 1 tablet by mouth daily.    [provider]  nystatin (MYCOSTATIN/NYSTOP) powder Apply to perineal area topically daily    [provider]  omeprazole (PRILOSEC) 10 MG capsule Take 10 mg by mouth daily.    [provider]  oxyCODONE-acetaminophen (PERCOCET/ROXICET) 5-325 MG tablet Take 1-2 tablets by mouth every 6 (six) hours as needed for severe pain.    [provider]  polyethylene glycol (MIRALAX / GLYCOLAX) packet Take 17 g by mouth 2 (two) times daily. 07/17/16   Florencia Reasons, MD  saccharomyces boulardii (FLORASTOR) 250 MG capsule Take 250 mg by mouth 2 (two) times daily.    [provider]  senna-docusate (SENOKOT-S) 8.6-50 MG tablet Take 1 tablet by mouth at  bedtime. 07/17/16   Florencia Reasons, MD  sodium chloride 1 g tablet Take 1 g by mouth 2 (two) times daily with a meal.    [provider]  thiamine 100 MG tablet Take 1 tablet (100 mg total) by mouth daily. 07/17/16   Florencia Reasons, MD    Allergies    No known allergies  Review of Systems   Review of Systems  Constitutional: Negative for fatigue.  HENT: Negative for tinnitus.   Eyes: Positive for discharge (Tearing), redness and itching. Negative for photophobia, pain and visual disturbance.  Respiratory: Negative for shortness of breath.   Cardiovascular: Negative for chest pain.  Gastrointestinal: Negative for nausea and vomiting.  Musculoskeletal: Negative for back  pain, gait problem and neck pain.  Skin: Negative for wound.  Neurological: Negative for dizziness, weakness, light-headedness, numbness and headaches.  Psychiatric/Behavioral: Negative for confusion and decreased concentration.    Physical Exam Updated Vital Signs BP 130/86 (BP Location: Right Arm)   Pulse 72   Temp 97.6 F (36.4 C) (Oral)   Resp 17   Physical Exam Vitals and nursing note reviewed.  Constitutional:      Appearance: He is well-developed.  HENT:     Head: Normocephalic and atraumatic.  Eyes:     General: Lids are normal. Vision grossly intact. No allergic shiner or visual field deficit.       Right eye: No foreign body or discharge.     Extraocular Movements: Extraocular movements intact.     Right eye: Normal extraocular motion.     Left eye: Normal extraocular motion.     Conjunctiva/sclera:     Right eye: Right conjunctiva is injected. Hemorrhage present. No chemosis or exudate.    Left eye: Left conjunctiva is not injected. No exudate or hemorrhage.    Pupils: Pupils are equal.     Right eye: Pupil is round, reactive and not sluggish. No corneal abrasion or fluorescein uptake.     Left eye: Pupil is round, reactive and not sluggish. No corneal abrasion or fluorescein uptake.     Funduscopic  exam:    Right eye: Hemorrhage present.        Left eye: No hemorrhage.     Slit lamp exam:    Right eye: No photophobia.     Left eye: No photophobia.     Comments: Patient with medial subconjunctival hemorrhage of the right eye.  There is no periorbital swelling or edema.  No point tenderness around the orbit.  Lids are normal.  No hyphema noted.  Pulmonary:     Effort: No respiratory distress.  Musculoskeletal:     Cervical back: Normal range of motion and neck supple.  Skin:    General: Skin is warm and dry.  Neurological:     Mental Status: He is alert.     ED Results / Procedures / Treatments   Labs (all labs ordered are listed, but only abnormal results are displayed) Labs Reviewed - No data to display  EKG None  Radiology No results found.  Procedures Procedures (including critical care time)  Medications Ordered in ED Medications  fluorescein ophthalmic strip 1 strip (1 strip Right Eye Given 09/15/19 0848)  tetracaine (PONTOCAINE) 0.5 % ophthalmic solution 2 drop (2 drops Right Eye Given 09/15/19 0848)    ED Course  I have reviewed the triage vital signs and the nursing notes.  Pertinent labs & imaging results that were available during my care of the patient were reviewed by me and considered in my medical decision making (see chart for details).  Patient seen and examined.   Vital signs reviewed and are as follows: BP 130/86 (BP Location: Right Arm)   Pulse 72   Temp 97.6 F (36.4 C) (Oral)   Resp 17    Two drops of tetracaine instilled into affected eye.   Fluorescein strip applied to affected eye. Wood's lamp used to assess for corneal abrasion. No corneal abrasion identified. No foreign bodies noted. No visible hyphema.   Patient tolerated procedure well without immediate complication.      Visual Acuity  Right Eye Distance: 20/40 Left Eye Distance: 20/32 Bilateral Distance: 20/40  Right Eye Near:   Left Eye Near:  Bilateral Near:      Patient counseled on expectant management of subconjunctival hemorrhage.  Prescription for loratadine given for preceding allergy type symptoms.      MDM Rules/Calculators/A&P                      Patient with minor injury to the right eye resulting in a subconjunctival hemorrhage.  Corneas clear without signs of abrasion.  Patient does not have any eye pain.  Full range of motion of the eye.  Do not suspect periorbital cellulitis or orbital fracture.   Final Clinical Impression(s) / ED Diagnoses Final diagnoses:  Subconjunctival hemorrhage of right eye  Allergic conjunctivitis of both eyes    Rx / DC Orders ED Discharge Orders    None       Carlisle Cater, PA-C 09/15/19 [id number]    Davonna Belling, MD 09/15/19 1521

## 2019-09-15 NOTE — Discharge Instructions (Signed)
Please read and follow all provided instructions.  Your diagnoses today include:  1. Subconjunctival hemorrhage of right eye   2. Allergic conjunctivitis of both eyes     Tests performed today include:  Visual acuity testing to check your vision  Fluorescein dye examination to look for scratches on your eye  Vital signs. See below for your results today.   Medications prescribed:   Loratadine -antihistamine for allergies  Take any prescribed medications only as directed.  Home care instructions:  Follow any educational materials contained in this packet. If you wear contact lenses, do not use them until your eye caregiver approves. Follow-up care is necessary to be sure the infection is healing if not completely resolved in 2-3 days. See your caregiver or eye specialist as suggested for followup.   If you have an eye infection, wash your hands often as this is very contagious and is easily spread from person to person.   Follow-up instructions: Return to the emergency department with any concerns.  Return instructions:   Please return to the Emergency Department if you experience worsening symptoms.   Please return immediately if you develop severe pain, pus drainage, new change in vision, or fever.  Please return if you have any other emergent concerns.  Additional Information:  Your vital signs today were: BP 130/86 (BP Location: Right Arm)   Pulse 72   Temp 97.6 F (36.4 C) (Oral)   Resp 17  If your blood pressure (BP) was elevated above 135/85 this visit, please have this repeated by your doctor within one month. ---------------

## 2019-09-15 NOTE — ED Triage Notes (Signed)
Pt. Stated, I was hit in the eye, something popped off from painting on Thursday.

## 2019-10-02 ENCOUNTER — Other Ambulatory Visit: Payer: Self-pay

## 2019-10-02 ENCOUNTER — Emergency Department (HOSPITAL_COMMUNITY)
Admission: EM | Admit: 2019-10-02 | Discharge: 2019-10-02 | Disposition: A | Discharge status: Home / Self Care | Payer: No Typology Code available for payment source | Attending: Emergency Medicine | Admitting: Emergency Medicine

## 2019-10-02 ENCOUNTER — Encounter (HOSPITAL_COMMUNITY): Payer: Self-pay | Admitting: Emergency Medicine

## 2019-10-02 DIAGNOSIS — I10 Essential (primary) hypertension: Secondary | ICD-10-CM | POA: Insufficient documentation

## 2019-10-02 DIAGNOSIS — Z87891 Personal history of nicotine dependence: Secondary | ICD-10-CM | POA: Diagnosis not present

## 2019-10-02 DIAGNOSIS — Y939 Activity, unspecified: Secondary | ICD-10-CM | POA: Diagnosis not present

## 2019-10-02 DIAGNOSIS — M542 Cervicalgia: Secondary | ICD-10-CM | POA: Insufficient documentation

## 2019-10-02 DIAGNOSIS — Y92410 Unspecified street and highway as the place of occurrence of the external cause: Secondary | ICD-10-CM | POA: Insufficient documentation

## 2019-10-02 DIAGNOSIS — M546 Pain in thoracic spine: Secondary | ICD-10-CM | POA: Diagnosis not present

## 2019-10-02 DIAGNOSIS — Y999 Unspecified external cause status: Secondary | ICD-10-CM | POA: Diagnosis not present

## 2019-10-02 DIAGNOSIS — Z7982 Long term (current) use of aspirin: Secondary | ICD-10-CM | POA: Insufficient documentation

## 2019-10-02 NOTE — Discharge Instructions (Addendum)
Your blood pressure is elevated here in the emergency department.  Please have this rechecked by your primary care doctor. Use Tylenol as needed for pain.  Use gentle movement and hot and cold therapy for any muscle pain.
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
## 2021-06-11 ENCOUNTER — Ambulatory Visit (HOSPITAL_COMMUNITY)
Admission: EM | Admit: 2021-06-11 | Discharge: 2021-06-11 | Disposition: A | Discharge status: Home / Self Care | Payer: Non-veteran care | Attending: Family Medicine | Admitting: Family Medicine

## 2021-06-11 ENCOUNTER — Other Ambulatory Visit: Payer: Self-pay

## 2021-06-11 ENCOUNTER — Encounter (HOSPITAL_COMMUNITY): Payer: Self-pay

## 2021-06-11 DIAGNOSIS — L089 Local infection of the skin and subcutaneous tissue, unspecified: Secondary | ICD-10-CM

## 2021-06-11 DIAGNOSIS — B9689 Other specified bacterial agents as the cause of diseases classified elsewhere: Secondary | ICD-10-CM | POA: Diagnosis not present

## 2021-06-11 DIAGNOSIS — S0990XA Unspecified injury of head, initial encounter: Secondary | ICD-10-CM | POA: Diagnosis not present

## 2021-06-11 MED ORDER — KETOROLAC TROMETHAMINE 30 MG/ML IJ SOLN
INTRAMUSCULAR | Status: AC
  Filled 2021-06-11: qty 1

## 2021-06-11 MED ORDER — DOXYCYCLINE HYCLATE 100 MG PO CAPS: 100.0000 mg | ORAL_CAPSULE | Freq: Two times a day (BID) | ORAL | 0 refills | Status: DC

## 2021-06-11 MED ORDER — KETOROLAC TROMETHAMINE 30 MG/ML IJ SOLN
30.0000 mg | Freq: Once | INTRAMUSCULAR | Status: AC
  Administered 2021-06-11: 30 mg via INTRAMUSCULAR
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
## 2021-06-11 NOTE — ED Triage Notes (Signed)
Pt reports shaving his head on Friday. He is not sure if the clippers or the brush hit the top of his head. Pt reports no loss of consciousness. ?

## 2021-06-11 NOTE — Discharge Instructions (Addendum)
Stop putting Neosporin on your scalp. I think your skin is reacting to it. ? ?You were given a shot of: ?Medications  ?ketorolac (TORADOL) 30 MG/ML injection 30 mg (has no administration in time range)  ? ?Begin taking the antibiotic by mouth. ?

## 2021-06-15 ENCOUNTER — Other Ambulatory Visit: Payer: Self-pay

## 2021-06-15 ENCOUNTER — Encounter (HOSPITAL_COMMUNITY): Payer: Self-pay | Admitting: *Deleted

## 2021-06-15 ENCOUNTER — Ambulatory Visit (HOSPITAL_COMMUNITY)
Admission: EM | Admit: 2021-06-15 | Discharge: 2021-06-15 | Disposition: A | Discharge status: Home / Self Care | Payer: Non-veteran care | Attending: Family Medicine | Admitting: Family Medicine

## 2021-06-15 DIAGNOSIS — L03811 Cellulitis of head [any part, except face]: Secondary | ICD-10-CM

## 2021-06-15 MED ORDER — HYDROXYZINE HCL 25 MG PO TABS: 12.5000 mg | ORAL_TABLET | Freq: Three times a day (TID) | ORAL | 0 refills | Status: DC | PRN

## 2021-06-15 MED ORDER — MUPIROCIN 2 % EX OINT: 1.0000 "application " | TOPICAL_OINTMENT | Freq: Two times a day (BID) | CUTANEOUS | 0 refills | Status: DC

## 2021-06-15 MED ORDER — AMOXICILLIN-POT CLAVULANATE 875-125 MG PO TABS: 1.0000 | ORAL_TABLET | Freq: Two times a day (BID) | ORAL | 0 refills | Status: AC

## 2021-06-15 NOTE — Discharge Instructions (Addendum)
He can stop doxycycline ? ?Take amoxicillin-clavulanate 875 mg, 1 tab twice daily with food for 7 days ? ?Use mupirocin antibiotic ointment on the open area on your scalp, twice daily till better ? ?You can take hydroxyzine 25 mg, 1/2 to 1 tablet every 8 hours as needed for itching ? ?If your swelling is worsening, or if you are not improving in 2 to 3 days, consider going to the emergency room for possible imaging evaluation ?

## 2021-06-15 NOTE — ED Triage Notes (Signed)
Pt was seen on Thursday and treated with Doxy for lac on scalp. Pt returns today because of increased redness and swelling has spread to Rt upper eyelid and bridge of nose. Pt also reports he has ben scratching the skin because the site itches. ?

## 2021-06-15 NOTE — ED Provider Notes (Signed)
[facility]    CSN: [id number] Arrival date & time: 06/15/21  0801      History   Chief Complaint Chief Complaint  Patient presents with   Skin Problem    HPI Calvin Mckee is a 71 y.o. male.   HPI Here for swelling of his forehead and scalp.  On February 23 he had some shears or scissors fall and hit him on his right vertex of his head.  He was seen here on March 2, at which time he was having some redness and irritation around the small wound.  Doxycycline was sent in for possible wound infection.  Today he returns with increased swelling and mild erythema of the area of his glabella and of his right lateral frontal area.  He also continues to have redness around the wound that is possibly increased in size  .  No nausea or vomiting.  No fever or chills.  He is not a diabetic.  Past Medical History:  Diagnosis Date   Arthritis    Hypertension    PAD (peripheral artery disease) ([place])     Patient Active Problem List   Diagnosis Date Noted   Bilateral hand numbness 02/14/2018   S/P BKA (below knee amputation), right ([place]) 07/30/2016   Constipation due to opioid therapy 07/30/2016   Anemia    Right foot infection    Gangrene of right foot ([place]) 07/20/2016   SIAD (syndrome of inappropriate antidiuresis) ([place]) 07/20/2016   Benign essential HTN 07/20/2016   Osteomyelitis ([place]) 07/10/2016   Hypokalemia 07/10/2016   Pain and swelling of right lower leg 07/10/2016   Alcohol use 07/10/2016   Polysubstance abuse ([place]) 07/10/2016   Tobacco use disorder 07/10/2016   Cellulitis and abscess of toe of right foot 07/10/2016    Past Surgical History:  Procedure Laterality Date   ABDOMINAL AORTOGRAM N/A 07/23/2016   Procedure: Abdominal Aortogram;  Surgeon: Elam Dutch, MD;  Location: [facility];  Service: Cardiovascular;  Laterality: N/A;   AMPUTATION Right 07/27/2016   Procedure: RIGHT BELOW KNEE AMPUTATION;  Surgeon: Waynetta Sandy, MD;   Location: [facility];  Service: Vascular;  Laterality: Right;   CERVICAL FUSION     LOWER EXTREMITY ANGIOGRAPHY Right 07/23/2016   Procedure: Lower Extremity Angiography;  Surgeon: Elam Dutch, MD;  Location: [facility];  Service: Cardiovascular;  Laterality: Right;   PERIPHERAL VASCULAR INTERVENTION Right 07/23/2016   Procedure: Peripheral Vascular Intervention;  Surgeon: Elam Dutch, MD;  Location: [facility];  Service: Cardiovascular;  Laterality: Right;  SFA        Home Medications    Prior to Admission medications   Medication Sig Start Date End Date Taking? Authorizing Provider  amoxicillin-clavulanate (AUGMENTIN) 875-125 MG tablet Take 1 tablet by mouth 2 (two) times daily for 7 days. 06/15/21 06/22/21 Yes Thomos Domine, Gwenlyn Perking, MD  hydrOXYzine (ATARAX) 25 MG tablet Take 0.5-1 tablets (12.5-25 mg total) by mouth every 8 (eight) hours as needed. 06/15/21  Yes Barrett Henle, MD  mupirocin ointment (BACTROBAN) 2 % Apply 1 application topically 2 (two) times daily. To affected area till better 06/15/21  Yes Arleigh Dicola, Gwenlyn Perking, MD  amLODipine (NORVASC) 10 MG tablet Take 1 tablet (10 mg total) by mouth daily. 07/17/16   Florencia Reasons, MD  aspirin EC 81 MG EC tablet Take 1 tablet (81 mg total) by mouth daily. 07/30/16   Rosita Fire, MD  cholecalciferol (VITAMIN D) 1000 units tablet  Take 1,000 Units by mouth 2 (two) times daily.    [provider]  clonazePAM (KLONOPIN) 0.5 MG tablet Take 0.5 tablets (0.25 mg total) by mouth every 8 (eight) hours as needed for anxiety. 07/30/16   Reed, Tiffany L, DO  clopidogrel (PLAVIX) 75 MG tablet Take 1 tablet (75 mg total) by mouth daily. 07/30/16   Rosita Fire, MD  fluticasone (FLONASE) 50 MCG/ACT nasal spray Place 1 spray into both nostrils daily. 05/08/18   Tacy Learn, PA-C  gabapentin (NEURONTIN) 300 MG capsule Take 1 capsule (300 mg total) by mouth 2 (two) times daily. 12/27/17   Hedges, Dellis Filbert, PA-C   hydrochlorothiazide (HYDRODIURIL) 50 MG tablet Take 50 mg by mouth daily.    [provider]  loratadine (CLARITIN) 10 MG tablet Take 1 tablet (10 mg total) by mouth daily. 09/15/19   Carlisle Cater, PA-C  losartan (COZAAR) 50 MG tablet Take 50 mg by mouth daily.    [provider]  magnesium oxide (MAG-OX) 400 MG tablet Take 1 tablet (400 mg total) by mouth daily. 07/17/16   Florencia Reasons, MD  Multiple Vitamin (MULTIVITAMIN WITH MINERALS) TABS tablet Take 1 tablet by mouth daily.    [provider]  nystatin (MYCOSTATIN/NYSTOP) powder Apply to perineal area topically daily    [provider]  omeprazole (PRILOSEC) 10 MG capsule Take 10 mg by mouth daily.    [provider]  polyethylene glycol (MIRALAX / GLYCOLAX) packet Take 17 g by mouth 2 (two) times daily. 07/17/16   Florencia Reasons, MD  saccharomyces boulardii (FLORASTOR) 250 MG capsule Take 250 mg by mouth 2 (two) times daily.    [provider]  senna-docusate (SENOKOT-S) 8.6-50 MG tablet Take 1 tablet by mouth at bedtime. 07/17/16   Florencia Reasons, MD  sodium chloride 1 g tablet Take 1 g by mouth 2 (two) times daily with a meal.    [provider]  thiamine 100 MG tablet Take 1 tablet (100 mg total) by mouth daily. 07/17/16   Florencia Reasons, MD    Family History History reviewed. No pertinent family history.  Social History Social History   Tobacco Use   Smoking status: Former    Packs/day: 1.00    Years: 24.00    Pack years: 24.00    Types: Cigarettes    Start date: 1996    Quit date: 06/2018    Years since quitting: 3.0   Smokeless tobacco: Never  Substance Use Topics   Alcohol use: Yes    Comment: occassionally   Drug use: Yes    Types: "Crack" cocaine, Other-see comments    Comment: percocet      Allergies   No known allergies   Review of Systems Review of Systems   Physical Exam Triage Vital Signs ED Triage Vitals  Enc Vitals Group     BP 06/15/21 0819 (!) 187/78      Pulse Rate 06/15/21 0819 80     Resp 06/15/21 0819 18     Temp 06/15/21 0819 98.8 F (37.1 C)     Temp src --      SpO2 06/15/21 0819 95 %     Weight --      Height --      Head Circumference --      Peak Flow --      Pain Score 06/15/21 0817 2     Pain Loc --      Pain Edu? --  Excl. in GC? --    No data found.  Updated Vital Signs BP (!) 187/78    Pulse 80    Temp 98.8 F (37.1 C)    Resp 18    SpO2 95%   Visual Acuity Right Eye Distance:   Left Eye Distance:   Bilateral Distance:    Right Eye Near:   Left Eye Near:    Bilateral Near:     Physical Exam Vitals reviewed.  Constitutional:      General: He is not in acute distress.    Appearance: He is not ill-appearing, toxic-appearing or diaphoretic.  HENT:     Head:     Comments: There is still erythema in diameter of 5 cm, with an eschar over wound that is about 4 mm in diameter in the center of that.  There is actually not much induration over that erythema.  There is some swelling in his glabella area with some mild erythema there.  There is also some mild swelling superior and lateral to his right eyebrow on the frontal area there  Eyes:     Extraocular Movements: Extraocular movements intact.  Cardiovascular:     Rate and Rhythm: Normal rate and regular rhythm.     Heart sounds: No murmur heard. Pulmonary:     Breath sounds: Normal breath sounds.  Skin:    Coloration: Skin is not jaundiced or pale.  Neurological:     Mental Status: He is alert and oriented to person, place, and time.  Psychiatric:        Behavior: Behavior normal.     UC Treatments / Results  Labs (all labs ordered are listed, but only abnormal results are displayed) Labs Reviewed - No data to display  EKG   Radiology No results found.  Procedures Procedures (including critical care time)  Medications Ordered in UC Medications - No data to display  Initial Impression / Assessment and Plan / UC Course  I have reviewed  the triage vital signs and the nursing notes.  Pertinent labs & imaging results that were available during my care of the patient were reviewed by me and considered in my medical decision making (see chart for details).     I will change his antibiotic to Augmentin, and add mupirocin topical.  Also since it is itching a good bit I will send in some hydroxyzine.  Reviewed with the patient reasons to go to the emergency room, mainly worsening swelling or not improving in the next 48 hours.  I explained that we do not order advanced imaging here and if he is worsening he most likely will need that.  He will not see his new primary doctor until later this month Final Clinical Impressions(s) / UC Diagnoses   Final diagnoses:  Cellulitis of head except face     Discharge Instructions      He can stop doxycycline  Take amoxicillin-clavulanate 875 mg, 1 tab twice daily with food for 7 days  Use mupirocin antibiotic ointment on the open area on your scalp, twice daily till better  You can take hydroxyzine 25 mg, 1/2 to 1 tablet every 8 hours as needed for itching  If your swelling is worsening, or if you are not improving in 2 to 3 days, consider going to the emergency room for possible imaging evaluation     ED Prescriptions     Medication Sig Dispense Auth. Provider   amoxicillin-clavulanate (AUGMENTIN) 875-125 MG tablet Take 1 tablet by mouth  2 (two) times daily for 7 days. 14 tablet Ethelwyn Gilbertson, Gwenlyn Perking, MD   mupirocin ointment (BACTROBAN) 2 % Apply 1 application topically 2 (two) times daily. To affected area till better 22 g Barrett Henle, MD   hydrOXYzine (ATARAX) 25 MG tablet Take 0.5-1 tablets (12.5-25 mg total) by mouth every 8 (eight) hours as needed. 15 tablet Majorie Santee, Gwenlyn Perking, MD      I have reviewed the PDMP during this encounter.   Barrett Henle, MD 06/15/21 [phone]

## 2021-06-16 ENCOUNTER — Emergency Department (HOSPITAL_COMMUNITY)
Admission: EM | Admit: 2021-06-16 | Discharge: 2021-06-16 | Disposition: A | Discharge status: Home / Self Care | Payer: No Typology Code available for payment source | Attending: Emergency Medicine | Admitting: Emergency Medicine

## 2021-06-16 ENCOUNTER — Emergency Department (HOSPITAL_COMMUNITY): Payer: No Typology Code available for payment source

## 2021-06-16 ENCOUNTER — Encounter (HOSPITAL_COMMUNITY): Payer: Self-pay | Admitting: Emergency Medicine

## 2021-06-16 ENCOUNTER — Other Ambulatory Visit: Payer: Self-pay

## 2021-06-16 DIAGNOSIS — W04XXXA Fall while being carried or supported by other persons, initial encounter: Secondary | ICD-10-CM | POA: Diagnosis not present

## 2021-06-16 DIAGNOSIS — I10 Essential (primary) hypertension: Secondary | ICD-10-CM | POA: Diagnosis not present

## 2021-06-16 DIAGNOSIS — L03213 Periorbital cellulitis: Secondary | ICD-10-CM | POA: Diagnosis not present

## 2021-06-16 DIAGNOSIS — Z79899 Other long term (current) drug therapy: Secondary | ICD-10-CM | POA: Insufficient documentation

## 2021-06-16 DIAGNOSIS — S0001XA Abrasion of scalp, initial encounter: Secondary | ICD-10-CM | POA: Insufficient documentation

## 2021-06-16 DIAGNOSIS — G3101 Pick's disease: Secondary | ICD-10-CM | POA: Insufficient documentation

## 2021-06-16 DIAGNOSIS — L03211 Cellulitis of face: Secondary | ICD-10-CM

## 2021-06-16 DIAGNOSIS — Z7982 Long term (current) use of aspirin: Secondary | ICD-10-CM | POA: Diagnosis not present

## 2021-06-16 DIAGNOSIS — S0990XA Unspecified injury of head, initial encounter: Secondary | ICD-10-CM | POA: Diagnosis present

## 2021-06-16 LAB — CBC WITH DIFFERENTIAL/PLATELET
Abs Immature Granulocytes: 0.01 10*3/uL (ref 0.00–0.07)
Basophils Absolute: 0.1 10*3/uL (ref 0.0–0.1)
Basophils Relative: 1 %
Eosinophils Absolute: 0.2 10*3/uL (ref 0.0–0.5)
Eosinophils Relative: 5 %
HCT: 39.4 % (ref 39.0–52.0)
Hemoglobin: 13.3 g/dL (ref 13.0–17.0)
Immature Granulocytes: 0 %
Lymphocytes Relative: 61 %
Lymphs Abs: 2.6 10*3/uL (ref 0.7–4.0)
MCH: 29 pg (ref 26.0–34.0)
MCHC: 33.8 g/dL (ref 30.0–36.0)
MCV: 85.8 fL (ref 80.0–100.0)
Monocytes Absolute: 0.5 10*3/uL (ref 0.1–1.0)
Monocytes Relative: 11 %
Neutro Abs: 0.9 10*3/uL — ABNORMAL LOW (ref 1.7–7.7)
Neutrophils Relative %: 22 %
Platelets: 275 10*3/uL (ref 150–400)
RBC: 4.59 MIL/uL (ref 4.22–5.81)
RDW: 14.6 % (ref 11.5–15.5)
WBC: 4.3 10*3/uL (ref 4.0–10.5)
nRBC: 0 % (ref 0.0–0.2)

## 2021-06-16 LAB — BASIC METABOLIC PANEL
Anion gap: 12 (ref 5–15)
BUN: 12 mg/dL (ref 8–23)
CO2: 27 mmol/L (ref 22–32)
Calcium: 9.4 mg/dL (ref 8.9–10.3)
Chloride: 99 mmol/L (ref 98–111)
Creatinine, Ser: 1.16 mg/dL (ref 0.61–1.24)
GFR, Estimated: >60 mL/min (ref >60)
Glucose, Bld: 106 mg/dL — ABNORMAL HIGH (ref 70–99)
Potassium: 3.9 mmol/L (ref 3.5–5.1)
Sodium: 138 mmol/L (ref 135–145)

## 2021-06-16 MED ORDER — IOHEXOL 300 MG/ML  SOLN
75.0000 mL | Freq: Once | INTRAMUSCULAR | Status: AC | PRN
  Administered 2021-06-16: 75 mL via INTRAVENOUS

## 2021-06-16 MED ORDER — CEFTRIAXONE SODIUM 1 G IJ SOLR
1.0000 g | Freq: Once | INTRAMUSCULAR | Status: AC
  Administered 2021-06-16: 1 g via INTRAMUSCULAR
  Filled 2021-06-16: qty 10

## 2021-06-16 MED ORDER — LIDOCAINE HCL (PF) 1 % IJ SOLN
INTRAMUSCULAR | Status: AC
  Administered 2021-06-16: 2.1 mL
  Filled 2021-06-16: qty 5

## 2021-06-16 NOTE — Discharge Instructions (Addendum)
Continue the Augmentin and the Mupirocin cream. ?
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
## 2021-06-16 NOTE — ED Notes (Signed)
Patient transported to CT 
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
## 2021-06-16 NOTE — ED Triage Notes (Signed)
Patient presents with scalp redness / abrasion sustained last week when a brush stick fell on his head , prescribed with oral antibiotic with no improvement .  ?

## 2021-06-25 NOTE — Progress Notes (Deleted)
?Subjective:  ? ? Calvin Mckee - 71 y.o. male MRN [id number]  Date of birth: 24-May-1950 ? ?HPI ? ?Calvin Mckee is to establish care. ? ?Current issues and/or concerns: ?URGENT CARE FOLLOW-UP: ?06/16/2021 [facility] Emergency Department per MD note: ?Medicines ordered and prescription drug management: ?  ?I ordered medication including rocephin  for infection  ?Reevaluation of the patient after these medicines showed that the patient improved ?I have reviewed the patients home medicines and have made adjustments as needed ?  ?Critical Interventions: ?  ?abx ?  ?  ?Problem List / ED Course: ?  ?Periorbital cellulitis:  Continue augmentin/mupirocin.  1 dose rocephin given in the ED. ?  ?  ?Reevaluation: ?  ?After the interventions noted above, I reevaluated the patient and found that they have :improved ?  ?  ?Social Determinants of Health: ?  ?Lives at home ?  ?  ?Dispostion: ?  ?After consideration of the diagnostic results and the patients response to treatment, I feel that the patent would benefit from discharge with outpatient f/u..   ?  ?06/30/2021: ?Right eye   ?  ?  ?  ? ?ROS per HPI  ? ? ? ?Health Maintenance:  ?Health Maintenance Due  ?Topic Date Due  ? COVID-19 Vaccine (1) Never done  ? Pneumonia Vaccine 56+ Years old (1 - PCV) Never done  ? Hepatitis C Screening  Never done  ? COLONOSCOPY (Pts 45-45yr Insurance coverage will need to be confirmed)  Never done  ? Zoster Vaccines- Shingrix (1 of 2) Never done  ? INFLUENZA VACCINE  11/10/2020  ? ? ? ?Past Medical History: ?Patient Active Problem List  ? Diagnosis Date Noted  ? Bilateral hand numbness 02/14/2018  ? S/P BKA (below knee amputation), right ([place] 07/30/2016  ? Constipation due to opioid therapy 07/30/2016  ? Anemia   ? Right foot infection   ? Gangrene of right foot ([facility] 07/20/2016  ? 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 Benign essential HTN 07/20/2016  ? Osteomyelitis ([place] 07/10/2016  ? Hypokalemia  07/10/2016  ? Pain and swelling of right lower leg 07/10/2016  ? Alcohol use 07/10/2016  ? Polysubstance abuse ([facility] 07/10/2016  ? Tobacco use disorder 07/10/2016  ? Cellulitis and abscess of toe of right foot 07/10/2016  ? ? ? ? ?Social History  ? reports that he quit smoking about 3 years ago. His smoking use included cigarettes. He started smoking about 27 years ago. He has a 24.00 pack-year smoking history. He has never used smokeless tobacco. He reports current alcohol use. He reports current drug use. Drugs: "Crack" cocaine and Other-see comments.  ? ?Family History  ?family history is not on file.  ? ?Medications: reviewed and updated ?  ?Objective:  ? Physical Exam ?There were no vitals taken for this visit. ?Physical Exam  ? ?   ?Assessment & Plan:  ? ? ? ? ? ? ? ?Patient was given clear instructions to go to Emergency Department or return to medical center if symptoms don't improve, worsen, or new problems develop.The patient verbalized understanding. ? ?I discussed the assessment and treatment plan with the patient. The patient was provided an opportunity to ask questions and all were answered. The patient agreed with the plan and demonstrated an understanding of the instructions. ?  ?The patient was advised to call back or seek an in-person evaluation if the symptoms worsen or if the condition fails to improve as anticipated. ? ? ? ?Calvin Mckee  Minette Brine, NP ?06/25/2021, 8:34 AM ?Primary Care at [facility]  ? ?

## 2021-06-30 ENCOUNTER — Ambulatory Visit: Payer: Non-veteran care | Admitting: Family

## 2021-06-30 DIAGNOSIS — L03211 Cellulitis of face: Secondary | ICD-10-CM

## 2021-06-30 DIAGNOSIS — Z7689 Persons encountering health services in other specified circumstances: Secondary | ICD-10-CM

## 2021-07-12 DIAGNOSIS — Z0100 Encounter for examination of eyes and vision without abnormal findings: Secondary | ICD-10-CM | POA: Diagnosis not present

## 2021-08-20 NOTE — Progress Notes (Signed)
Subjective:    Calvin Mckee - 71 y.o. male MRN [id number]  Date of birth: November 25, 1950  HPI  Calvin Mckee is to establish care.   Current issues and/or concerns: Patient reports he is established with the [place]. States he wanted to find a primary care closer to home to utilize if needed. Requesting handicap placard. 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 Followed by Orthopedics. No additional issues/concerns.   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, in remission ([facility] 08/28/2021   Depression 08/28/2021   Erectile dysfunction 08/28/2021   GERD (gastroesophageal reflux disease) 08/28/2021   Lack of housing 08/28/2021   MRSA (methicillin resistant Staphylococcus aureus) carrier 08/28/2021   Nicotine dependence 08/28/2021   Alcohol-induced mood disorder ([facility] 08/28/2021   Cannabis abuse 08/28/2021   Chronic pain syndrome 08/28/2021   Cigarette smoker 08/28/2021   Low back pain 08/28/2021   Major depressive disorder, single episode, in remission ([facility] [place] 08/28/2021   Opioid abuse ([facility] 08/28/2021   Other specified housing or economic circumstances 08/28/2021   Other, mixed, or unspecified nondependent drug abuse, unspecified 08/28/2021   Pain in soft tissues of limb 08/28/2021   Problem related to housing and economic circumstances, unspecified 08/28/2021   Reason for consultation 08/28/2021   Unspecified persistent mental disorders due to conditions classified elsewhere 08/28/2021   Tenosynovitis of hand 08/28/2021   Bilateral hand  numbness 02/14/2018   S/P BKA (below knee amputation), right ([facility] 07/30/2016   Constipation due to opioid therapy 07/30/2016   Anemia    Right foot infection    Gangrene of right foot ([facility] 07/20/2016   SIAD (syndrome of inappropriate antidiuresis) ([facility] 07/20/2016   Hypertension 07/20/2016   Osteomyelitis ([facility] 07/10/2016   Hypokalemia 07/10/2016   Pain and swelling of right lower leg 07/10/2016   Alcohol use 07/10/2016   Polysubstance abuse ([facility] 07/10/2016   Tobacco use disorder 07/10/2016   Cellulitis and abscess of toe of right foot 07/10/2016      Social History   reports that he quit smoking about 3 years ago. His smoking use included cigarettes. He started smoking about 27 years ago. He has a 24.00 pack-year smoking history. He has never used smokeless tobacco. He reports current alcohol use. He reports current drug use. Drugs: "Crack" cocaine and Other-see comments.   Family History  Family history is unknown by patient.   Medications: reviewed and updated   Objective:   Physical Exam BP 138/88 (BP Location: Left Arm, Patient Position: Sitting, Cuff Size: Large)   Pulse 81   Temp 98.3 F (36.8 C)   Resp 18   Ht 5' 7.72" (1.72 m)   Wt 190 lb (86.2 kg)   SpO2 93%   BMI 29.13 kg/m   Physical Exam HENT:     Head: Normocephalic and atraumatic.  Eyes:     Extraocular Movements: Extraocular movements intact.     Conjunctiva/sclera: Conjunctivae normal.     Pupils: Pupils are equal, round, and reactive to light.  Cardiovascular:     Rate and Rhythm: Normal rate and regular rhythm.     Pulses: Normal pulses.     Heart sounds: Normal heart sounds.  Pulmonary:     Effort: Pulmonary effort is normal.     Breath sounds: Normal breath sounds.  Musculoskeletal:     Cervical back: Normal range of motion and neck supple.  Neurological:     General: No focal deficit present.     Mental Status: He is alert and oriented to person, place, and time.  Psychiatric:         Mood and Affect: Mood normal.        Behavior: Behavior normal.       Assessment & Plan:  1. Encounter to establish care: - Patient presents today to establish care.  - Return for annual physical examination, labs, and health maintenance. Arrive fasting meaning having no food for at least 8 hours prior to appointment. You may have only water or black coffee. Please take scheduled medications as normal.  2. Encounter for completion of form with patient: 3. Hx of right B[facility]): - Per patient request handicap placard completed today in office.      Patient was given clear instructions to go to Emergency Department or return to medical center if symptoms don't improve, worsen, or new problems develop.The patient verbalized understanding.  I discussed the assessment and treatment plan with the patient. The patient was provided an opportunity to ask questions and all were answered. The patient agreed with the plan and demonstrated an understanding of the instructions.   The patient was advised to call back or seek an in-person evaluation if the symptoms worsen or if the condition fails to improve as anticipated.    Durene Fruits, NP 08/28/2021, 1:55 PM Primary Care at [facility]

## 2021-08-25 ENCOUNTER — Ambulatory Visit (HOSPITAL_COMMUNITY)
Admission: EM | Admit: 2021-08-25 | Discharge: 2021-08-25 | Disposition: A | Discharge status: Home / Self Care | Payer: No Typology Code available for payment source | Attending: Internal Medicine | Admitting: Internal Medicine

## 2021-08-25 ENCOUNTER — Encounter (HOSPITAL_COMMUNITY): Payer: Self-pay

## 2021-08-25 DIAGNOSIS — J3089 Other allergic rhinitis: Secondary | ICD-10-CM | POA: Diagnosis not present

## 2021-08-25 MED ORDER — AZELASTINE HCL 0.1 % NA SOLN: 1.0000 | Freq: Two times a day (BID) | NASAL | 0 refills | Status: DC

## 2021-08-25 MED ORDER — METHYLPREDNISOLONE SODIUM SUCC 125 MG IJ SOLR
80.0000 mg | Freq: Once | INTRAMUSCULAR | Status: AC
  Administered 2021-08-25: 80 mg via INTRAMUSCULAR

## 2021-08-25 MED ORDER — METHYLPREDNISOLONE SODIUM SUCC 125 MG IJ SOLR
INTRAMUSCULAR | Status: AC
  Filled 2021-08-25: qty 2

## 2021-08-25 NOTE — Discharge Instructions (Signed)
You were given a steroid shot in urgent care today to help alleviate your allergies.  You were also prescribed a nasal spray to help alleviate allergies.  Please follow-up if symptoms persist or worsen. ?

## 2021-08-25 NOTE — ED Triage Notes (Signed)
Pt presents with chronic nasal drainage, sneezing, and watery eyes that he states is unrelieved with OTC medication. ?

## 2021-08-25 NOTE — ED Provider Notes (Signed)
?[facility] ? ? ? ?CSN: [id number] ?Arrival date & time: 08/25/21  3295 ? ? ?  ? ?History   ?Chief Complaint ?Chief Complaint  ?Patient presents with  ? ALLERGY  ? ? ?HPI ?[name] is a 71 y.o. male.  ? ?Patient presents with nasal drainage, sneezing, watery eyes that has been present for approximately 2 to 3 weeks.  Patient is attributing his symptoms to allergies, therefore he has taken several antihistamines over-the-counter, Sudafed, over-the-counter nasal sprays with no improvement in symptoms.  Denies associated fever, cough, shortness of breath, chest pain, sore throat, ear pain, nausea, vomiting, diarrhea, abdominal pain.   Patient is requesting steroid injection today. ? ? ? ?Past Medical History:  ?Diagnosis Date  ? Arthritis   ? Hypertension   ? PAD (peripheral artery disease) ([place])   ? ? ?Patient Active Problem List  ? Diagnosis Date Noted  ? Bilateral hand numbness 02/14/2018  ? S/P BKA (below knee amputation), right ([place]) 07/30/2016  ? Constipation due to opioid therapy 07/30/2016  ? Anemia   ? Right foot infection   ? Gangrene of right foot ([place]) 07/20/2016  ? SIAD (syndrome of inappropriate antidiuresis) ([place]) 07/20/2016  ? Benign essential HTN 07/20/2016  ? Osteomyelitis ([place]) 07/10/2016  ? Hypokalemia 07/10/2016  ? Pain and swelling of right lower leg 07/10/2016  ? Alcohol use 07/10/2016  ? Polysubstance abuse ([place]) 07/10/2016  ? Tobacco use disorder 07/10/2016  ? Cellulitis and abscess of toe of right foot 07/10/2016  ? ? ?Past Surgical History:  ?Procedure Laterality Date  ? ABDOMINAL AORTOGRAM N/A 07/23/2016  ? Procedure: Abdominal Aortogram;  Surgeon: Elam Dutch, MD;  Location: [facility];  Service: Cardiovascular;  Laterality: N/A;  ? AMPUTATION Right 07/27/2016  ? Procedure: RIGHT BELOW KNEE AMPUTATION;  Surgeon: Waynetta Sandy, MD;  Location: [facility];  Service: Vascular;  Laterality: Right;  ? CERVICAL FUSION    ? LOWER EXTREMITY ANGIOGRAPHY Right  07/23/2016  ? Procedure: Lower Extremity Angiography;  Surgeon: Elam Dutch, MD;  Location: [facility];  Service: Cardiovascular;  Laterality: Right;  ? PERIPHERAL VASCULAR INTERVENTION Right 07/23/2016  ? Procedure: Peripheral Vascular Intervention;  Surgeon: Elam Dutch, MD;  Location: [facility];  Service: Cardiovascular;  Laterality: Right;  SFA ?  ? ? ? ? ? ?Home Medications   ? ?Prior to Admission medications   ?Medication Sig Start Date End Date Taking? Authorizing Provider  ?azelastine (ASTELIN) 0.1 % nasal spray Place 1 spray into both nostrils 2 (two) times daily. Use in each nostril as directed 08/25/21  Yes , Vilas E, FNP  ?amLODipine (NORVASC) 10 MG tablet Take 1 tablet (10 mg total) by mouth daily. 07/17/16   Florencia Reasons, MD  ?aspirin EC 81 MG EC tablet Take 1 tablet (81 mg total) by mouth daily. 07/30/16   Rosita Fire, MD  ?cholecalciferol (VITAMIN D) 1000 units tablet Take 1,000 Units by mouth 2 (two) times daily.    [provider]  ?clonazePAM (KLONOPIN) 0.5 MG tablet Take 0.5 tablets (0.25 mg total) by mouth every 8 (eight) hours as needed for anxiety. 07/30/16   Reed, Tiffany L, DO  ?clopidogrel (PLAVIX) 75 MG tablet Take 1 tablet (75 mg total) by mouth daily. 07/30/16   Rosita Fire, MD  ?fluticasone Asencion Islam) 50 MCG/ACT nasal spray Place 1 spray into both nostrils daily. 05/08/18   Tacy Learn, PA-C  ?gabapentin (NEURONTIN) 300 MG capsule Take 1 capsule (  300 mg total) by mouth 2 (two) times daily. 12/27/17   Hedges, Dellis Filbert, PA-C  ?hydrochlorothiazide (HYDRODIURIL) 50 MG tablet Take 50 mg by mouth daily.    [provider]  ?hydrOXYzine (ATARAX) 25 MG tablet Take 0.5-1 tablets (12.5-25 mg total) by mouth every 8 (eight) hours as needed. 06/15/21   Barrett Henle, MD  ?loratadine (CLARITIN) 10 MG tablet Take 1 tablet (10 mg total) by mouth daily. 09/15/19   Carlisle Cater, PA-C  ?losartan (COZAAR) 50 MG tablet Take 50 mg by mouth daily.     [provider]  ?magnesium oxide (MAG-OX) 400 MG tablet Take 1 tablet (400 mg total) by mouth daily. 07/17/16   Florencia Reasons, MD  ?Multiple Vitamin (MULTIVITAMIN WITH MINERALS) TABS tablet Take 1 tablet by mouth daily.    [provider]  ?mupirocin ointment (BACTROBAN) 2 % Apply 1 application topically 2 (two) times daily. To affected area till better 06/15/21   Barrett Henle, MD  ?nystatin (MYCOSTATIN/NYSTOP) powder Apply to perineal area topically daily    [provider]  ?omeprazole (PRILOSEC) 10 MG capsule Take 10 mg by mouth daily.    [provider]  ?polyethylene glycol (MIRALAX / GLYCOLAX) packet Take 17 g by mouth 2 (two) times daily. 07/17/16   Florencia Reasons, MD  ?saccharomyces boulardii (FLORASTOR) 250 MG capsule Take 250 mg by mouth 2 (two) times daily.    [provider]  ?senna-docusate (SENOKOT-S) 8.6-50 MG tablet Take 1 tablet by mouth at bedtime. 07/17/16   Florencia Reasons, MD  ?sodium chloride 1 g tablet Take 1 g by mouth 2 (two) times daily with a meal.    [provider]  ?thiamine 100 MG tablet Take 1 tablet (100 mg total) by mouth daily. 07/17/16   Florencia Reasons, MD  ? ? ?Family History ?Family History  ?Family history unknown: Yes  ? ? ?Social History ?Social History  ? ?Tobacco Use  ? Smoking status: Former  ?  Packs/day: 1.00  ?  Years: 24.00  ?  Pack years: 24.00  ?  Types: Cigarettes  ?  Start date: 67  ?  Quit date: 06/2018  ?  Years since quitting: 3.2  ? Smokeless tobacco: Never  ?Substance Use Topics  ? Alcohol use: Yes  ?  Comment: occassionally  ? Drug use: Yes  ?  Types: "Crack" cocaine, Other-see comments  ?  Comment: percocet   ? ? ? ?Allergies   ?No known allergies ? ? ?Review of Systems ?Review of Systems ?Per HPI ? ?Physical Exam ?Triage Vital Signs ?ED Triage Vitals  ?Enc Vitals Group  ?   BP 08/25/21 0953 (!) 165/91  ?   Pulse Rate 08/25/21 0953 77  ?   Resp 08/25/21 0953 17  ?   Temp 08/25/21 0953 98.1 ?F (36.7 ?C)  ?   Temp Source 08/25/21  0953 Oral  ?   SpO2 08/25/21 0953 95 %  ?   Weight --   ?   Height --   ?   Head Circumference --   ?   Peak Flow --   ?   Pain Score 08/25/21 0958 0  ?   Pain Loc --   ?   Pain Edu? --   ?   Excl. in [place]? --   ? ?No data found. ? ?Updated Vital Signs ?BP (!) 165/91 (BP Location: Right Arm)   Pulse 77   Temp 98.1 ?F (36.7 ?C) (Oral)   Resp 17  SpO2 95%  ? ?Visual Acuity ?Right Eye Distance:   ?Left Eye Distance:   ?Bilateral Distance:   ? ?Right Eye Near:   ?Left Eye Near:    ?Bilateral Near:    ? ?Physical Exam ?Constitutional:   ?   General: He is not in acute distress. ?   Appearance: Normal appearance. He is not toxic-appearing or diaphoretic.  ?HENT:  ?   Head: Normocephalic and atraumatic.  ?   Right Ear: Tympanic membrane and ear canal normal.  ?   Left Ear: Tympanic membrane and ear canal normal.  ?   Nose: Congestion present.  ?   Mouth/Throat:  ?   Mouth: Mucous membranes are moist.  ?   Pharynx: No posterior oropharyngeal erythema.  ?Eyes:  ?   Extraocular Movements: Extraocular movements intact.  ?   Conjunctiva/sclera: Conjunctivae normal.  ?   Pupils: Pupils are equal, round, and reactive to light.  ?Cardiovascular:  ?   Rate and Rhythm: Normal rate and regular rhythm.  ?   Pulses: Normal pulses.  ?   Heart sounds: Normal heart sounds.  ?Pulmonary:  ?   Effort: Pulmonary effort is normal. No respiratory distress.  ?   Breath sounds: Normal breath sounds. No stridor. No wheezing, rhonchi or rales.  ?Abdominal:  ?   General: Abdomen is flat. Bowel sounds are normal.  ?   Palpations: Abdomen is soft.  ?Musculoskeletal:     ?   General: Normal range of motion.  ?   Cervical back: Normal range of motion.  ?Skin: ?   General: Skin is warm and dry.  ?Neurological:  ?   General: No focal deficit present.  ?   Mental Status: He is alert and oriented to person, place, and time. Mental status is at baseline.  ?Psychiatric:     ?   Mood and Affect: Mood normal.     ?   Behavior: Behavior normal.     ?    Thought Content: Thought content normal.     ?   Judgment: Judgment normal.  ? ? ? ?UC Treatments / Results  ?Labs ?(all labs ordered are listed, but only abnormal results are displayed) ?Labs Reviewed - No data to

## 2021-08-28 ENCOUNTER — Encounter: Payer: Self-pay | Admitting: Family

## 2021-08-28 ENCOUNTER — Ambulatory Visit (INDEPENDENT_AMBULATORY_CARE_PROVIDER_SITE_OTHER): Payer: No Typology Code available for payment source | Admitting: Family

## 2021-08-28 VITALS — BP 138/88 | HR 81 | Temp 98.3°F | Resp 18 | Ht 67.72 in | Wt 190.0 lb

## 2021-08-28 DIAGNOSIS — Z599 Problem related to housing and economic circumstances, unspecified: Secondary | ICD-10-CM | POA: Insufficient documentation

## 2021-08-28 DIAGNOSIS — M79609 Pain in unspecified limb: Secondary | ICD-10-CM | POA: Insufficient documentation

## 2021-08-28 DIAGNOSIS — F09 Unspecified mental disorder due to known physiological condition: Secondary | ICD-10-CM | POA: Insufficient documentation

## 2021-08-28 DIAGNOSIS — K219 Gastro-esophageal reflux disease without esophagitis: Secondary | ICD-10-CM | POA: Insufficient documentation

## 2021-08-28 DIAGNOSIS — F17210 Nicotine dependence, cigarettes, uncomplicated: Secondary | ICD-10-CM | POA: Insufficient documentation

## 2021-08-28 DIAGNOSIS — Z89511 Acquired absence of right leg below knee: Secondary | ICD-10-CM

## 2021-08-28 DIAGNOSIS — F1094 Alcohol use, unspecified with alcohol-induced mood disorder: Secondary | ICD-10-CM | POA: Insufficient documentation

## 2021-08-28 DIAGNOSIS — F17200 Nicotine dependence, unspecified, uncomplicated: Secondary | ICD-10-CM | POA: Insufficient documentation

## 2021-08-28 DIAGNOSIS — F1921 Other psychoactive substance dependence, in remission: Secondary | ICD-10-CM | POA: Insufficient documentation

## 2021-08-28 DIAGNOSIS — IMO0001 Reserved for inherently not codable concepts without codable children: Secondary | ICD-10-CM | POA: Insufficient documentation

## 2021-08-28 DIAGNOSIS — Z5989 Other problems related to housing and economic circumstances: Secondary | ICD-10-CM | POA: Insufficient documentation

## 2021-08-28 DIAGNOSIS — F32A Depression, unspecified: Secondary | ICD-10-CM | POA: Insufficient documentation

## 2021-08-28 DIAGNOSIS — N529 Male erectile dysfunction, unspecified: Secondary | ICD-10-CM | POA: Insufficient documentation

## 2021-08-28 DIAGNOSIS — F1910 Other psychoactive substance abuse, uncomplicated: Secondary | ICD-10-CM | POA: Insufficient documentation

## 2021-08-28 DIAGNOSIS — F1110 Opioid abuse, uncomplicated: Secondary | ICD-10-CM | POA: Insufficient documentation

## 2021-08-28 DIAGNOSIS — Z0289 Encounter for other administrative examinations: Secondary | ICD-10-CM | POA: Diagnosis not present

## 2021-08-28 DIAGNOSIS — F325 Major depressive disorder, single episode, in full remission: Secondary | ICD-10-CM | POA: Insufficient documentation

## 2021-08-28 DIAGNOSIS — B182 Chronic viral hepatitis C: Secondary | ICD-10-CM | POA: Insufficient documentation

## 2021-08-28 DIAGNOSIS — M545 Low back pain, unspecified: Secondary | ICD-10-CM | POA: Insufficient documentation

## 2021-08-28 DIAGNOSIS — G894 Chronic pain syndrome: Secondary | ICD-10-CM | POA: Insufficient documentation

## 2021-08-28 DIAGNOSIS — Z22322 Carrier or suspected carrier of Methicillin resistant Staphylococcus aureus: Secondary | ICD-10-CM | POA: Insufficient documentation

## 2021-08-28 DIAGNOSIS — Z5900 Homelessness unspecified: Secondary | ICD-10-CM | POA: Insufficient documentation

## 2021-08-28 DIAGNOSIS — M659 Synovitis and tenosynovitis, unspecified: Secondary | ICD-10-CM | POA: Insufficient documentation

## 2021-08-28 DIAGNOSIS — Z7689 Persons encountering health services in other specified circumstances: Secondary | ICD-10-CM

## 2021-08-28 DIAGNOSIS — F1210 Cannabis abuse, uncomplicated: Secondary | ICD-10-CM | POA: Insufficient documentation

## 2021-08-28 NOTE — Progress Notes (Signed)
.  Pt presents to establish care, need handicap placard signed due to prosthetic right leg, states that he does have primary care with the [place] in [place] needed something closer to home

## 2021-09-03 ENCOUNTER — Ambulatory Visit: Payer: Self-pay | Admitting: *Deleted

## 2021-09-03 NOTE — Telephone Encounter (Signed)
Left a voicemail to call back to discuss symptoms with a nurse.

## 2021-09-03 NOTE — Telephone Encounter (Signed)
Message from Oneta Rack sent at 09/03/2021  3:42 PM EDT  Summary: Watery eyes   Patient states both eyes are watering all day for 1 month, eye drops are not working, Claritin is not working, allergies is not working and steroid shot is not working. Patient requesting a prescription.

## 2021-09-04 ENCOUNTER — Ambulatory Visit: Payer: Self-pay

## 2021-09-04 ENCOUNTER — Ambulatory Visit
Admission: EM | Admit: 2021-09-04 | Discharge: 2021-09-04 | Disposition: A | Discharge status: Home / Self Care | Payer: No Typology Code available for payment source | Attending: Physician Assistant | Admitting: Physician Assistant

## 2021-09-04 DIAGNOSIS — H109 Unspecified conjunctivitis: Secondary | ICD-10-CM

## 2021-09-04 MED ORDER — OLOPATADINE HCL 0.1 % OP SOLN: 1.0000 [drp] | Freq: Two times a day (BID) | OPHTHALMIC | 12 refills | Status: AC

## 2021-09-04 NOTE — Telephone Encounter (Signed)
Summary: Watery eyes   Patient states both eyes are watering all day for 1 month, eye drops are not working, Claritin is not working, allergies is not working and steroid shot is not working. Patient requesting a prescription.     Called pt  - LMOM

## 2021-09-04 NOTE — Progress Notes (Unsigned)
Patient ID: Calvin Mckee, male    DOB: 11-Jun-1950  MRN: [id number]  CC: Referral and Sinusitis   Subjective: Calvin Mckee is a 71 y.o. male who presents for referral and sinusitis.   His concerns today include:  Patient presents today requesting antibiotic for what he says is a sinus infection. Endorses frontal sinus pressure and year round allergies. States "I have tried every medication you can imagine for allergies and it doesn't work." "I want to see an allergy doctor."  Patient expresses concern that he was seen at the [facility] next door on 08/25/2021 and 09/04/2021. States "How come the Urgent Care did not give me an antibiotic?" "I told them what was wrong." Patient goes on the say "When I went back to the Urgent Care on last week she only gave me some eyedrops." "Why didn't she give me an antibiotic?"  I explained to patient that I cannot answer on behalf other providers.  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, in remission ([place]) 08/28/2021   Depression 08/28/2021   Erectile dysfunction 08/28/2021   GERD (gastroesophageal reflux disease) 08/28/2021   Lack of housing 08/28/2021   MRSA (methicillin resistant Staphylococcus aureus) carrier 08/28/2021   Nicotine dependence 08/28/2021   Alcohol-induced mood disorder ([place]) 08/28/2021   Cannabis abuse 08/28/2021   Chronic pain syndrome 08/28/2021   Cigarette smoker 08/28/2021   Low back pain 08/28/2021   Major depressive disorder, single episode, in remission ([place]) 08/28/2021   Opioid abuse ([place]) 08/28/2021   Other specified housing or economic circumstances 08/28/2021   Other, mixed, or unspecified nondependent drug abuse, unspecified 08/28/2021   Pain in soft tissues of limb 08/28/2021   Problem related to housing and economic circumstances, unspecified 08/28/2021   Reason for consultation 08/28/2021    Unspecified persistent mental disorders due to conditions classified elsewhere 08/28/2021   Tenosynovitis of hand 08/28/2021   Bilateral hand numbness 02/14/2018   S/P BKA (below knee amputation), right ([place]) 07/30/2016   Constipation due to opioid therapy 07/30/2016   Anemia    Right foot infection    Gangrene of right foot ([place]) 07/20/2016   SIAD (syndrome of inappropriate antidiuresis) ([place]) 07/20/2016   Hypertension 07/20/2016   Osteomyelitis ([place]) 07/10/2016   Hypokalemia 07/10/2016   Pain and swelling of right lower leg 07/10/2016   Alcohol use 07/10/2016   Polysubstance abuse ([place]) 07/10/2016   Tobacco use disorder 07/10/2016   Cellulitis and abscess of toe of right foot 07/10/2016     Current Outpatient Medications on File Prior to Visit  Medication Sig Dispense Refill   amLODipine (NORVASC) 10 MG tablet Take 1 tablet (10 mg total) by mouth daily. 30 tablet 0   cholecalciferol (VITAMIN D) 1000 units tablet Take 1,000 Units by mouth 2 (two) times daily.     clopidogrel (PLAVIX) 75 MG tablet Take 1 tablet (75 mg total) by mouth daily.     fluticasone (FLONASE) 50 MCG/ACT nasal spray Place 1 spray into both nostrils daily. 16 g 2   gabapentin (NEURONTIN) 300 MG capsule Take 1 capsule (300 mg total) by mouth 2 (two) times daily. 10 capsule 0   hydrochlorothiazide (HYDRODIURIL) 50 MG tablet Take 50 mg by mouth daily.     loratadine (CLARITIN) 10 MG tablet Take 1 tablet (10 mg total) by mouth daily. 30 tablet 0   losartan (COZAAR) 50 MG tablet  Take 50 mg by mouth daily.     magnesium oxide (MAG-OX) 400 MG tablet Take 1 tablet (400 mg total) by mouth daily. 30 tablet 0   Multiple Vitamin (MULTIVITAMIN WITH MINERALS) TABS tablet Take 1 tablet by mouth daily.     mupirocin ointment (BACTROBAN) 2 % Apply 1 application topically 2 (two) times daily. To affected area till better 22 g 0   olopatadine (PATADAY) 0.1 % ophthalmic solution Place 1 drop into both eyes 2 (two) times daily. 5  mL 12   omeprazole (PRILOSEC) 10 MG capsule Take 10 mg by mouth daily.     sertraline (ZOLOFT) 100 MG tablet TAKE ONE-HALF TABLET BY MOUTH DAILY FOR MENTAL HEALTH     sildenafil (VIAGRA) 100 MG tablet TAKE ONE-HALF TABLET BY MOUTH AS INSTRUCTED (TAKE 1 HOUR PRIOR TO SEXUAL ACTIVITY *DO NOT EXCEED 1 DOSE PER 24 HOUR PERIOD*)     No current facility-administered medications on file prior to visit.    Allergies  Allergen Reactions   Ace Inhibitors Cough   Angiotensin Cough   No Known Allergies     Social History   Socioeconomic History   Marital status: Single    Spouse name: Not on file   Number of children: Not on file   Years of education: Not on file   Highest education level: Not on file  Occupational History   Not on file  Tobacco Use   Smoking status: Former    Packs/day: 1.00    Years: 24.00    Pack years: 24.00    Types: Cigarettes    Start date: 25    Quit date: 06/2018    Years since quitting: 3.2    Passive exposure: Current   Smokeless tobacco: Never  Substance and Sexual Activity   Alcohol use: Yes    Comment: occassionally   Drug use: Yes    Types: "Crack" cocaine, Other-see comments    Comment: percocet    Sexual activity: Never  Other Topics Concern   Not on file  Social History Narrative   Not on file   Social Determinants of Health   Financial Resource Strain: Not on file  Food Insecurity: Not on file  Transportation Needs: Not on file  Physical Activity: Not on file  Stress: Not on file  Social Connections: Not on file  Intimate Partner Violence: Not on file    Family History  Family history unknown: Yes    Past Surgical History:  Procedure Laterality Date   ABDOMINAL AORTOGRAM N/A 07/23/2016   Procedure: Abdominal Aortogram;  Surgeon: Elam Dutch, MD;  Location: [facility];  Service: Cardiovascular;  Laterality: N/A;   AMPUTATION Right 07/27/2016   Procedure: RIGHT BELOW KNEE AMPUTATION;  Surgeon: Waynetta Sandy, MD;  Location: [facility];  Service: Vascular;  Laterality: Right;   CERVICAL FUSION     LOWER EXTREMITY ANGIOGRAPHY Right 07/23/2016   Procedure: Lower Extremity Angiography;  Surgeon: Elam Dutch, MD;  Location: [facility];  Service: Cardiovascular;  Laterality: Right;   PERIPHERAL VASCULAR INTERVENTION Right 07/23/2016   Procedure: Peripheral Vascular Intervention;  Surgeon: Elam Dutch, MD;  Location: [facility];  Service: Cardiovascular;  Laterality: Right;  SFA     ROS: Review of Systems Negative except as stated above  PHYSICAL EXAM: BP 115/71 (BP Location: Left Arm, Patient Position: Sitting, Cuff Size: Large)   Pulse 88   Temp 98.3 F (36.8 C)   Resp 18  Ht 5' 7.72" (1.72 m)   Wt 190 lb (86.2 kg)   SpO2 92%   BMI 29.13 kg/m   Physical Exam HENT:     Head: Normocephalic and atraumatic.  Eyes:     Extraocular Movements: Extraocular movements intact.     Conjunctiva/sclera: Conjunctivae normal.     Pupils: Pupils are equal, round, and reactive to light.  Cardiovascular:     Rate and Rhythm: Normal rate and regular rhythm.     Pulses: Normal pulses.     Heart sounds: Normal heart sounds.  Pulmonary:     Effort: Pulmonary effort is normal.     Breath sounds: Normal breath sounds.  Musculoskeletal:     Cervical back: Normal range of motion and neck supple.  Neurological:     General: No focal deficit present.     Mental Status: He is alert and oriented to person, place, and time.  Psychiatric:        Mood and Affect: Affect is angry.        Behavior: Behavior is agitated.   ASSESSMENT AND PLAN: When I walked into the room patient asked who I was. I reintroduced myself. As I initially introduced myself to patient on 08/28/2021 when he established care with me. Patient went on to say that he wanted to meet/see his new primary provider listed on his insurance card as Dorna Mai, MD. I asked patient if he would like to continue today's  appointment with me or if he would prefer to reschedule with Dorna Mai, MD. Patient states "Margarita Sermons are something else."This office is something else." "Y'all don't have anything together at this place." "This place is a joke." "I don't think I'm coming back to this place again."   I confirmed with patient again if he would like to continue appointment or reschedule appointment with Dorna Mai, MD. Patient states "If she hasn't came in here to see me yet then she doesn't want to see me!" "And I don't want to see her either!" "You can do the same thing she can do!"  1. Perennial allergic rhinitis: - Per patient request referral to Allergy for further evaluation and management.  - Ambulatory referral to Allergy  2. Sinusitis, unspecified chronicity, unspecified location: - Amoxicillin-Clavulanate as prescribed. Counseled on medication adherence and adverse effects.  - amoxicillin-clavulanate (AUGMENTIN) 875-125 MG tablet; Take 1 tablet by mouth 2 (two) times daily for 7 days.  Dispense: 14 tablet; Refill: 0   Patient was given the opportunity to ask questions.  Patient verbalized understanding of the plan and was able to repeat key elements of the plan. Patient was given clear instructions to go to Emergency Department or return to medical center if symptoms don't improve, worsen, or new problems develop.The patient verbalized understanding.   Orders Placed This Encounter  Procedures   Ambulatory referral to Allergy     Requested Prescriptions   Signed Prescriptions Disp Refills   amoxicillin-clavulanate (AUGMENTIN) 875-125 MG tablet 14 tablet 0    Sig: Take 1 tablet by mouth 2 (two) times daily for 7 days.    Follow-up with primary provider as scheduled.   Camillia Herter, NP

## 2021-09-04 NOTE — Telephone Encounter (Signed)
Attempted to call patient- left message to call office

## 2021-09-04 NOTE — ED Triage Notes (Signed)
Pt presents with recurrent eye drainage that is unrelieved with eye drops.

## 2021-09-04 NOTE — Telephone Encounter (Signed)
Summary: Watery eyes   Patient states both eyes are watering all day for 1 month, eye drops are not working, Claritin is not working, allergies is not working and steroid shot is not working. Patient requesting a prescription.      Called pt and LMOM to return call.

## 2021-09-05 NOTE — ED Provider Notes (Signed)
[facility]    CSN: [id number] Arrival date & time: 09/04/21  1045      History   Chief Complaint Chief Complaint  Patient presents with   Eye Drainage    HPI Calvin Mckee is a 71 y.o. male.   Pt reports he has redness and drainage from both eyes.  Pt reports no relief with over the counter medications.    The history is provided by the patient. No language interpreter was used.  Conjunctivitis This is a new problem. The current episode started more than 2 days ago. The problem occurs constantly. The problem has been gradually worsening. Nothing aggravates the symptoms. Nothing relieves the symptoms. He has tried nothing for the symptoms.   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   Nicotine dependence 08/28/2021   Alcohol-induced mood disorder ([place]) 08/28/2021   Cannabis abuse 08/28/2021   Chronic pain syndrome 08/28/2021   Cigarette smoker 08/28/2021   Low back pain 08/28/2021   Major depressive disorder, single episode, in remission ([place]) 08/28/2021   Opioid abuse ([place]) 08/28/2021   Other specified housing or economic circumstances 08/28/2021   Other, mixed, or unspecified nondependent drug abuse, unspecified 08/28/2021   Pain in soft tissues of limb 08/28/2021   Problem related to housing and economic circumstances, unspecified 08/28/2021   Reason for consultation 08/28/2021   Unspecified persistent mental disorders due to conditions classified elsewhere 08/28/2021   Tenosynovitis of  hand 08/28/2021   Bilateral hand numbness 02/14/2018   S/P BKA (below knee amputation), right ([place]) 07/30/2016   Constipation due to opioid therapy 07/30/2016   Anemia    Right foot infection    Gangrene of right foot ([place]) 07/20/2016   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   Hypertension 07/20/2016   Osteomyelitis ([place]) 07/10/2016   Hypokalemia 07/10/2016   Pain and swelling of right lower leg 07/10/2016   Alcohol use 07/10/2016   Polysubstance abuse ([place]) 07/10/2016   Tobacco use disorder 07/10/2016   Cellulitis and abscess of toe of right foot 07/10/2016    Past Surgical History:  Procedure Laterality Date   ABDOMINAL AORTOGRAM N/A 07/23/2016   Procedure: Abdominal Aortogram;  Surgeon: Elam Dutch, MD;  Location: [facility];  Service: Cardiovascular;  Laterality: N/A;   AMPUTATION Right 07/27/2016   Procedure: RIGHT BELOW KNEE AMPUTATION;  Surgeon: Waynetta Sandy, MD;  Location: [facility];  Service: Vascular;  Laterality: Right;   CERVICAL FUSION     LOWER EXTREMITY ANGIOGRAPHY Right 07/23/2016   Procedure: Lower Extremity Angiography;  Surgeon: Elam Dutch, MD;  Location: [facility];  Service: Cardiovascular;  Laterality: Right;   PERIPHERAL VASCULAR INTERVENTION Right 07/23/2016   Procedure: Peripheral Vascular Intervention;  Surgeon: Elam Dutch, MD;  Location: [facility];  Service: Cardiovascular;  Laterality: Right;  SFA        Home Medications    Prior to Admission medications   Medication Sig Start Date End Date Taking? Authorizing Provider  olopatadine (PATADAY) 0.1 %  ophthalmic solution Place 1 drop into both eyes 2 (two) times daily. 09/04/21  Yes Caryl Ada K, PA-C  amLODipine (NORVASC) 10 MG tablet Take 1 tablet (10 mg total) by mouth daily. 07/17/16   Florencia Reasons, MD  cholecalciferol (VITAMIN D) 1000 units tablet Take 1,000 Units by mouth 2 (two) times daily.    [provider]   clopidogrel (PLAVIX) 75 MG tablet Take 1 tablet (75 mg total) by mouth daily. 07/30/16   Rosita Fire, MD  fluticasone (FLONASE) 50 MCG/ACT nasal spray Place 1 spray into both nostrils daily. 05/08/18   Tacy Learn, PA-C  gabapentin (NEURONTIN) 300 MG capsule Take 1 capsule (300 mg total) by mouth 2 (two) times daily. 12/27/17   Hedges, Dellis Filbert, PA-C  hydrochlorothiazide (HYDRODIURIL) 50 MG tablet Take 50 mg by mouth daily.    [provider]  loratadine (CLARITIN) 10 MG tablet Take 1 tablet (10 mg total) by mouth daily. 09/15/19   Carlisle Cater, PA-C  losartan (COZAAR) 50 MG tablet Take 50 mg by mouth daily.    [provider]  magnesium oxide (MAG-OX) 400 MG tablet Take 1 tablet (400 mg total) by mouth daily. 07/17/16   Florencia Reasons, MD  Multiple Vitamin (MULTIVITAMIN WITH MINERALS) TABS tablet Take 1 tablet by mouth daily.    [provider]  mupirocin ointment (BACTROBAN) 2 % Apply 1 application topically 2 (two) times daily. To affected area till better 06/15/21   Barrett Henle, MD  omeprazole (PRILOSEC) 10 MG capsule Take 10 mg by mouth daily.    [provider]  sertraline (ZOLOFT) 100 MG tablet TAKE ONE-HALF TABLET BY MOUTH DAILY FOR MENTAL HEALTH 06/11/21   [provider]  sildenafil (VIAGRA) 100 MG tablet TAKE ONE-HALF TABLET BY MOUTH AS INSTRUCTED (TAKE 1 HOUR PRIOR TO SEXUAL ACTIVITY *DO NOT EXCEED 1 DOSE PER 24 HOUR PERIOD*) 06/11/21   [provider]    Family History Family History  Family history unknown: Yes    Social History Social History   Tobacco Use   Smoking status: Former    Packs/day: 1.00    Years: 24.00    Pack years: 24.00    Types: Cigarettes    Start date: 1996    Quit date: 06/2018    Years since quitting: 3.2   Smokeless tobacco: Never  Substance Use Topics   Alcohol use: Yes    Comment: occassionally   Drug use: Yes    Types: "Crack" cocaine, Other-see comments    Comment: percocet       Allergies   Ace inhibitors, Angiotensin, and No known allergies   Review of Systems Review of Systems  All other systems reviewed and are negative.   Physical Exam Triage Vital Signs ED Triage Vitals  Enc Vitals Group     BP 09/04/21 1107 (!) 146/85     Pulse Rate 09/04/21 1107 84     Resp 09/04/21 1107 18     Temp 09/04/21 1107 98.2 F (36.8 C)     Temp Source 09/04/21 1107 Oral     SpO2 09/04/21 1107 93 %     Weight --      Height --      Head Circumference --      Peak Flow --      Pain Score 09/04/21 1106 0     Pain Loc --      Pain Edu? --      Excl. in [place]? --  No data found.  Updated Vital Signs BP (!) 146/85 (BP Location: Left Arm)   Pulse 84   Temp 98.2 F (36.8 C) (Oral)   Resp 18   SpO2 93%   Visual Acuity Right Eye Distance:   Left Eye Distance:   Bilateral Distance:    Right Eye Near:   Left Eye Near:    Bilateral Near:     Physical Exam Vitals and nursing note reviewed.  Constitutional:      General: He is not in acute distress.    Appearance: He is well-developed.  HENT:     Head: Normocephalic and atraumatic.  Eyes:     General:        Right eye: Discharge present.        Left eye: Discharge present.    Conjunctiva/sclera: Conjunctivae normal.  Cardiovascular:     Rate and Rhythm: Normal rate and regular rhythm.     Heart sounds: No murmur heard. Pulmonary:     Effort: Pulmonary effort is normal.  Musculoskeletal:        General: No swelling.     Cervical back: Neck supple.  Skin:    General: Skin is warm and dry.     Capillary Refill: Capillary refill takes less than 2 seconds.  Neurological:     Mental Status: He is alert.  Psychiatric:        Mood and Affect: Mood normal.     UC Treatments / Results  Labs (all labs ordered are listed, but only abnormal results are displayed) Labs Reviewed - No data to display  EKG   Radiology No results found.  Procedures Procedures (including critical care  time)  Medications Ordered in UC Medications - No data to display  Initial Impression / Assessment and Plan / UC Course  I have reviewed the triage vital signs and the nursing notes.  Pertinent labs & imaging results that were available during my care of the patient were reviewed by me and considered in my medical decision making (see chart for details).      Final Clinical Impressions(s) / UC Diagnoses   Final diagnoses:  Conjunctivitis of both eyes, unspecified conjunctivitis type   Discharge Instructions   None    ED Prescriptions     Medication Sig Dispense Auth. Provider   olopatadine (PATADAY) 0.1 % ophthalmic solution Place 1 drop into both eyes 2 (two) times daily. 5 mL Fransico Meadow, [place]      PDMP not reviewed this encounter. An After Visit Summary was printed and given to the patient.    Fransico Meadow, [place] 09/05/21 1850

## 2021-09-10 ENCOUNTER — Ambulatory Visit (INDEPENDENT_AMBULATORY_CARE_PROVIDER_SITE_OTHER): Payer: No Typology Code available for payment source | Admitting: Family

## 2021-09-10 ENCOUNTER — Encounter: Payer: Self-pay | Admitting: Family

## 2021-09-10 VITALS — BP 115/71 | HR 88 | Temp 98.3°F | Resp 18 | Ht 67.72 in | Wt 190.0 lb

## 2021-09-10 DIAGNOSIS — J3089 Other allergic rhinitis: Secondary | ICD-10-CM | POA: Diagnosis not present

## 2021-09-10 DIAGNOSIS — J329 Chronic sinusitis, unspecified: Secondary | ICD-10-CM

## 2021-09-10 MED ORDER — AMOXICILLIN-POT CLAVULANATE 875-125 MG PO TABS: 1.0000 | ORAL_TABLET | Freq: Two times a day (BID) | ORAL | 0 refills | Status: AC

## 2021-09-10 NOTE — Patient Instructions (Signed)

## 2021-09-10 NOTE — Progress Notes (Signed)
Pt presents for sinus infection states been going on for about 2 months,pt desires some type of antibiotic to clear it up, states he wants allergy injections so wants referral to allergist

## 2022-06-03 ENCOUNTER — Telehealth: Payer: Self-pay

## 2022-06-03 NOTE — Telephone Encounter (Signed)
Att to contact pt to schedule AWV for 06/07/22 no ans lvm to call office to schedule

## 2022-07-14 DIAGNOSIS — Z0100 Encounter for examination of eyes and vision without abnormal findings: Secondary | ICD-10-CM | POA: Diagnosis not present

## 2022-07-14 DIAGNOSIS — H524 Presbyopia: Secondary | ICD-10-CM | POA: Diagnosis not present

## 2022-07-14 DIAGNOSIS — H2513 Age-related nuclear cataract, bilateral: Secondary | ICD-10-CM | POA: Diagnosis not present

## 2022-08-24 DIAGNOSIS — M13842 Other specified arthritis, left hand: Secondary | ICD-10-CM | POA: Diagnosis not present

## 2022-12-23 ENCOUNTER — Telehealth: Payer: Self-pay

## 2022-12-23 NOTE — Telephone Encounter (Signed)
Pt called requesting prescription for new socket for prosthetic.  [facility] who did not have this pt in their system, but his BKA was in 2018. Stated that insurance needs a face-to-face doctor visit within the past 6 months.  Called pt, two identifiers used. Pt does not use [employer], but uses [employer] instead. Pt is having the molding cast currently and is [facility] with the [facility] for other requirements. Once he gets that information, he will call and schedule an appt if needed. Confirmed understanding.

## 2023-07-05 DIAGNOSIS — R195 Other fecal abnormalities: Secondary | ICD-10-CM | POA: Diagnosis not present

## 2023-07-05 DIAGNOSIS — Z800 Family history of malignant neoplasm of digestive organs: Secondary | ICD-10-CM | POA: Diagnosis not present

## 2023-07-05 DIAGNOSIS — D649 Anemia, unspecified: Secondary | ICD-10-CM | POA: Diagnosis not present

## 2023-07-05 DIAGNOSIS — R1013 Epigastric pain: Secondary | ICD-10-CM | POA: Diagnosis not present

## 2024-01-10 ENCOUNTER — Telehealth: Payer: Self-pay | Admitting: *Deleted

## 2024-01-10 ENCOUNTER — Emergency Department (HOSPITAL_COMMUNITY)
Admission: EM | Admit: 2024-01-10 | Discharge: 2024-01-11 | Disposition: A | Discharge status: Home / Self Care | Attending: Emergency Medicine | Admitting: Emergency Medicine

## 2024-01-10 DIAGNOSIS — E871 Hypo-osmolality and hyponatremia: Secondary | ICD-10-CM | POA: Diagnosis not present

## 2024-01-10 DIAGNOSIS — L03116 Cellulitis of left lower limb: Secondary | ICD-10-CM | POA: Diagnosis not present

## 2024-01-10 DIAGNOSIS — E119 Type 2 diabetes mellitus without complications: Secondary | ICD-10-CM | POA: Insufficient documentation

## 2024-01-10 DIAGNOSIS — M79672 Pain in left foot: Secondary | ICD-10-CM

## 2024-01-10 LAB — CBC WITH DIFFERENTIAL/PLATELET
Abs Immature Granulocytes: 0 K/uL (ref 0.00–0.07)
Basophils Absolute: 0 K/uL (ref 0.0–0.1)
Basophils Relative: 1 %
Eosinophils Absolute: 0.2 K/uL (ref 0.0–0.5)
Eosinophils Relative: 5 %
HCT: 35.9 % — ABNORMAL LOW (ref 39.0–52.0)
Hemoglobin: 12.1 g/dL — ABNORMAL LOW (ref 13.0–17.0)
Immature Granulocytes: 0 %
Lymphocytes Relative: 57 %
Lymphs Abs: 3.1 K/uL (ref 0.7–4.0)
MCH: 28.5 pg (ref 26.0–34.0)
MCHC: 33.7 g/dL (ref 30.0–36.0)
MCV: 84.5 fL (ref 80.0–100.0)
Monocytes Absolute: 0.4 K/uL (ref 0.1–1.0)
Monocytes Relative: 8 %
Neutro Abs: 1.5 K/uL — ABNORMAL LOW (ref 1.7–7.7)
Neutrophils Relative %: 29 %
Platelets: 297 K/uL (ref 150–400)
RBC: 4.25 MIL/uL (ref 4.22–5.81)
RDW: 14.1 % (ref 11.5–15.5)
WBC: 5.2 K/uL (ref 4.0–10.5)
nRBC: 0 % (ref 0.0–0.2)

## 2024-01-10 LAB — BASIC METABOLIC PANEL WITH GFR
Anion gap: 10 (ref 5–15)
BUN: 7 mg/dL — ABNORMAL LOW (ref 8–23)
CO2: 25 mmol/L (ref 22–32)
Calcium: 9.3 mg/dL (ref 8.9–10.3)
Chloride: 98 mmol/L (ref 98–111)
Creatinine, Ser: 1.08 mg/dL (ref 0.61–1.24)
GFR, Estimated: >60 mL/min (ref >60)
Glucose, Bld: 95 mg/dL (ref 70–99)
Potassium: 3.7 mmol/L (ref 3.5–5.1)
Sodium: 133 mmol/L — ABNORMAL LOW (ref 135–145)

## 2024-01-10 NOTE — ED Provider Triage Note (Signed)
 Emergency Medicine Provider Triage Evaluation Note  Calvin Mckee , a 73 y.o. male  was evaluated in triage.  Pt complains of foot pain. Pt sts he has had numbness to his LLE for several months.  Also notice two blisters on the dorsum of his L foot from rubbing against something in his shoes.  He was told by his vascular surgeon Dr. Sheree that he might need vascular surgery for his L leg several years ago.  Pt denies fever, or recent injury  Review of Systems  Positive: As above Negative: As above  Physical Exam  BP (!) 165/89 (BP Location: Right Arm)   Pulse 72   Temp 98 F (36.7 C)   Resp 17   SpO2 95%  Gen:   Awake, no distress   Resp:  Normal effort  MSK:   Moves extremities without difficulty  Other:  L foot: 2 shallow ulcers noted to dorsum of foot.  Cap refills intact, able to palpate DP pulse  Medical Decision Making  Medically screening exam initiated at 8:04 PM.  Appropriate orders placed.  Calvin Mckee was informed that the remainder of the evaluation will be completed by another provider, this initial triage assessment does not replace that evaluation, and the importance of remaining in the ED until their evaluation is complete.  Pt has PAD, sts he concerns about losing his foot   Nivia Colon, PA-C 01/10/24 2007

## 2024-01-10 NOTE — ED Triage Notes (Signed)
 Patient via POV with complaints of left foot blister. Patient reports the foot is bad and numb and can't feel it. Patient reports the blister appeared yesterday.

## 2024-01-10 NOTE — ED Triage Notes (Signed)
 Pt report numbness on left foot for a month, pt reports hx of poor circulation and amputation on right foot. Pt on Plavix , last dose today.

## 2024-01-11 ENCOUNTER — Emergency Department (HOSPITAL_COMMUNITY)

## 2024-01-11 MED ORDER — CEPHALEXIN 250 MG PO CAPS
500.0000 mg | ORAL_CAPSULE | Freq: Once | ORAL | Status: AC
  Administered 2024-01-11: 500 mg via ORAL
  Filled 2024-01-11: qty 2

## 2024-01-11 MED ORDER — CEPHALEXIN 500 MG PO CAPS: 500.0000 mg | ORAL_CAPSULE | Freq: Two times a day (BID) | ORAL | 0 refills | Status: AC

## 2024-01-11 NOTE — ED Provider Notes (Signed)
 [place] EMERGENCY DEPARTMENT AT [facility] Provider Note   CSN: [id number] Arrival date & time: 01/10/24  1936     History Chief Complaint  Patient presents with   Foot Pain    HPI Calvin Mckee is a 73 y.o. male presenting for chief complaint of foot pain. 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 States that he put a shoe on very tightly yesterday on accident.  He has no feeling in his left foot and did not realize until he noticed a skin tear on the top of his left foot yesterday.  He denies fevers chills nausea vomiting syncope shortness of breath.  He is otherwise ambulatory tolerating p.o. intake.  No known sick contacts. Patient's recorded medical, surgical, social, medication list and allergies were reviewed in the Snapshot window as part of the initial history.   Review of Systems   Review of Systems  Constitutional:  Negative for chills and fever.  HENT:  Negative for ear pain and sore throat.   Eyes:  Negative for pain and visual disturbance.  Respiratory:  Negative for cough and shortness of breath.   Cardiovascular:  Negative for chest pain and palpitations.  Gastrointestinal:  Negative for abdominal pain and vomiting.  Genitourinary:  Negative for dysuria and hematuria.  Musculoskeletal:  Negative for arthralgias and back pain.  Skin:  Negative for color change and rash.  Neurological:  Negative for seizures and syncope.  All other systems reviewed and are negative.   Physical Exam Updated Vital Signs BP (!) 172/87 (BP Location: Right Arm)   Pulse 67   Temp 97.9 F (36.6 C)   Resp 16   SpO2 94%  Physical Exam Vitals and nursing note reviewed.  Constitutional:      General: He is not in acute distress.    Appearance: He is well-developed.  HENT:     Head: Normocephalic and atraumatic.  Eyes:     Conjunctiva/sclera: Conjunctivae normal.  Cardiovascular:     Rate and Rhythm: Normal rate and regular rhythm.     Heart sounds:  No murmur heard. Pulmonary:     Effort: Pulmonary effort is normal. No respiratory distress.     Breath sounds: Normal breath sounds.  Abdominal:     Palpations: Abdomen is soft.     Tenderness: There is no abdominal tenderness.  Musculoskeletal:        General: Signs of injury present. No swelling.     Cervical back: Neck supple.  Skin:    General: Skin is warm and dry.     Capillary Refill: Capillary refill takes less than 2 seconds.  Neurological:     Mental Status: He is alert.  Psychiatric:        Mood and Affect: Mood normal.      ED Course/ Medical Decision Making/ A&P    Procedures Procedures   Medications Ordered in ED Medications  cephALEXin  (KEFLEX ) capsule 500 mg (has no administration in time range)    Medical Decision Making:   This is a 73 year old male present with a chief complaint of left foot pain.  He has a small skin tear on the top of the left foot from putting his shoe on too tight yesterday.  X-ray shows no orthopedic fracture.  No evidence osteomyelitis.  Lab work grossly reassuring.  He does have an area of circumscribed erythema.  He states he had a diabetic foot ulcer once that started with a toenail injury and is  worried about progression of disease. He is worried since he already lost his right leg that he is going to progress to lose this foot as well from this injury. Fortunately right now the wound is clean dry intact and patient has strong pulses of the left foot.  He states that he was supposed to follow-up with vascular surgery in the past due to vascular stenosis but was lost to follow-up. Given the surrounding area of erythema, will treat with Keflex  to prevent worsening cellulitis. Will refer back to vascular surgery for outpatient care and management of peripheral vasculopathy.  Recommended patient follow-up with his PCP in the interim for ongoing care and management reassessment within 48 hours for ongoing disease process.  Clinical  Impression:  1. Foot pain, left   2. Cellulitis of left lower extremity      Data Unavailable   Final Clinical Impression(s) / ED Diagnoses Final diagnoses:  Foot pain, left  Cellulitis of left lower extremity    Rx / DC Orders ED Discharge Orders          Ordered    cephALEXin  (KEFLEX ) 500 MG capsule  2 times daily        01/11/24 0303              Jerral Meth, MD 01/11/24 [phone]

## 2024-01-11 NOTE — ED Notes (Signed)
 Discharge instructions reviewed.   Newly prescribed medications discussed. Pharmacy verified.   Opportunity for questions and concerns provided.   Alert, oriented and ambulatory.   Displays no signs of distress.   Encouraged to follow up with Vascular surgery as indicated. Referral provided.

## 2024-01-20 ENCOUNTER — Other Ambulatory Visit: Payer: Self-pay | Admitting: *Deleted

## 2024-01-20 DIAGNOSIS — I739 Peripheral vascular disease, unspecified: Secondary | ICD-10-CM

## 2024-02-08 ENCOUNTER — Encounter: Payer: Self-pay | Admitting: Vascular Surgery

## 2024-02-08 ENCOUNTER — Ambulatory Visit (HOSPITAL_COMMUNITY)
Admission: RE | Admit: 2024-02-08 | Discharge: 2024-02-08 | Disposition: A | Discharge status: Home / Self Care | Source: Ambulatory Visit | Attending: Vascular Surgery | Admitting: Vascular Surgery

## 2024-02-08 ENCOUNTER — Other Ambulatory Visit: Payer: Self-pay

## 2024-02-08 ENCOUNTER — Ambulatory Visit (INDEPENDENT_AMBULATORY_CARE_PROVIDER_SITE_OTHER): Admitting: Vascular Surgery

## 2024-02-08 VITALS — BP 171/80 | HR 85 | Temp 98.2°F | Ht 67.0 in | Wt 200.0 lb

## 2024-02-08 DIAGNOSIS — I7025 Atherosclerosis of native arteries of other extremities with ulceration: Secondary | ICD-10-CM

## 2024-02-08 DIAGNOSIS — I739 Peripheral vascular disease, unspecified: Secondary | ICD-10-CM | POA: Insufficient documentation

## 2024-02-08 LAB — VAS US ABI WITH/WO TBI: Left ABI: 0.84

## 2024-02-08 NOTE — H&P (View-Only) (Signed)
 Patient ID: Calvin Mckee, male   DOB: 1950/12/14, 73 y.o.   MRN: [id number]  Reason for Consult: New Patient (Initial Visit)   Referred by Shelda Atlas, MD  Subjective:     HPI:  Calvin Mckee is a 73 y.o. male with history of stenting of his right lower extremity ultimately required right below-knee amputation.  He had a new prosthetic made today at [facility] clinic decorated with the [employer] and states that this is fitting very well.  He continues to go to the gym 4 to 5 days a week.  He is a former smoker quit 10 years ago.  He does have hypertension his underlying risk factor.  Now has a small ulcer on his left foot where he had a small skin tear.  He thinks this is healing without of antibiotics but has been persistent for several weeks.  He does not have any claudication in the left lower extremity.  He is on Plavix  as single agent antiplatelet.  Past Medical History:  Diagnosis Date   Arthritis    Hypertension    PAD (peripheral artery disease)    Family History  Family history unknown: Yes   Past Surgical History:  Procedure Laterality Date   ABDOMINAL AORTOGRAM N/A 07/23/2016   Procedure: Abdominal Aortogram;  Surgeon: Carlin FORBES Haddock, MD;  Location: [facility] INVASIVE CV LAB;  Service: Cardiovascular;  Laterality: N/A;   AMPUTATION Right 07/27/2016   Procedure: RIGHT BELOW KNEE AMPUTATION;  Surgeon: Penne Lonni Colorado, MD;  Location: [facility] OR;  Service: Vascular;  Laterality: Right;   CERVICAL FUSION     LOWER EXTREMITY ANGIOGRAPHY Right 07/23/2016   Procedure: Lower Extremity Angiography;  Surgeon: Carlin FORBES Haddock, MD;  Location: [facility] INVASIVE CV LAB;  Service: Cardiovascular;  Laterality: Right;   PERIPHERAL VASCULAR INTERVENTION Right 07/23/2016   Procedure: Peripheral Vascular Intervention;  Surgeon: Carlin FORBES Haddock, MD;  Location: [facility] INVASIVE CV LAB;  Service: Cardiovascular;  Laterality: Right;  SFA     Short Social History:  Social History   Tobacco Use    Smoking status: Former    Current packs/day: 0.00    Average packs/day: 1 pack/day for 24.2 years (24.2 ttl pk-yrs)    Types: Cigarettes    Start date: 23    Quit date: 06/2018    Years since quitting: 5.6    Passive exposure: Current   Smokeless tobacco: Never  Substance Use Topics   Alcohol use: Yes    Comment: occassionally    Allergies  Allergen Reactions   Ace Inhibitors Cough   Angiotensin Cough   No Known Allergies     Current Outpatient Medications  Medication Sig Dispense Refill   amLODipine  (NORVASC ) 10 MG tablet Take 1 tablet (10 mg total) by mouth daily. 30 tablet 0   cholecalciferol  (VITAMIN D) 1000 units tablet Take 1,000 Units by mouth 2 (two) times daily.     clopidogrel  (PLAVIX ) 75 MG tablet Take 1 tablet (75 mg total) by mouth daily.     fluticasone  (FLONASE ) 50 MCG/ACT nasal spray Place 1 spray into both nostrils daily. 16 g 2   gabapentin  (NEURONTIN ) 300 MG capsule Take 1 capsule (300 mg total) by mouth 2 (two) times daily. 10 capsule 0   hydrochlorothiazide (HYDRODIURIL) 50 MG tablet Take 50 mg by mouth daily.     loratadine  (CLARITIN ) 10 MG tablet Take 1 tablet (10 mg total) by mouth daily. 30 tablet 0   losartan (COZAAR) 50 MG tablet Take 50  mg by mouth daily.     magnesium  oxide (MAG-OX) 400 MG tablet Take 1 tablet (400 mg total) by mouth daily. 30 tablet 0   Multiple Vitamin (MULTIVITAMIN WITH MINERALS) TABS tablet Take 1 tablet by mouth daily.     mupirocin  ointment (BACTROBAN ) 2 % Apply 1 application topically 2 (two) times daily. To affected area till better 22 g 0   olopatadine  (PATADAY ) 0.1 % ophthalmic solution Place 1 drop into both eyes 2 (two) times daily. 5 mL 12   omeprazole (PRILOSEC) 10 MG capsule Take 10 mg by mouth daily.     sertraline (ZOLOFT) 100 MG tablet TAKE ONE-HALF TABLET BY MOUTH DAILY FOR MENTAL HEALTH     sildenafil (VIAGRA) 100 MG tablet TAKE ONE-HALF TABLET BY MOUTH AS INSTRUCTED (TAKE 1 HOUR PRIOR TO SEXUAL ACTIVITY *DO NOT  EXCEED 1 DOSE PER 24 HOUR PERIOD*)     No current facility-administered medications for this visit.    Review of Systems  Constitutional:  Constitutional negative. HENT: HENT negative.  Eyes: Eyes negative.  Respiratory: Respiratory negative.  Cardiovascular: Cardiovascular negative.  GI: Gastrointestinal negative.  Musculoskeletal: Musculoskeletal negative.  Skin:       Left foot ulcer Neurological: Neurological negative. Hematologic: Hematologic/lymphatic negative.  Psychiatric: Psychiatric negative.        Objective:  Objective   Vitals:   02/08/24 1357  BP: (!) 171/80  Pulse: 85  Temp: 98.2 F (36.8 C)  SpO2: 92%  Weight: 200 lb (90.7 kg)  Height: 5' 7 (1.702 m)   Body mass index is 31.32 kg/m.  Physical Exam HENT:     Head: Normocephalic.     Nose: Nose normal.     Mouth/Throat:     Mouth: Mucous membranes are moist.  Cardiovascular:     Rate and Rhythm: Normal rate.     Pulses:          Femoral pulses are 1+ on the right side and 2+ on the left side.      Popliteal pulses are 0 on the left side.       Dorsalis pedis pulses are 0 on the left side.       Posterior tibial pulses are 0 on the left side.  Pulmonary:     Effort: Pulmonary effort is normal.  Abdominal:     General: Abdomen is flat.  Musculoskeletal:     Comments: Right lower extremity prosthetic  Neurological:     General: No focal deficit present.     Mental Status: He is alert.     Data: ABI Findings:  +---------+------------------+-----+--------+--------+  Right   Rt Pressure (mmHg)IndexWaveformComment   +---------+------------------+-----+--------+--------+  Brachial 173                                      +---------+------------------+-----+--------+--------+  PTA                                    BKA       +---------+------------------+-----+--------+--------+  DP                                     BKA        +---------+------------------+-----+--------+--------+  Great Toe  BKA       +---------+------------------+-----+--------+--------+   +---------+------------------+-----+----------+-------+  Left    Lt Pressure (mmHg)IndexWaveform  Comment  +---------+------------------+-----+----------+-------+  Brachial 180                                       +---------+------------------+-----+----------+-------+  PTA     151               0.84 biphasic           +---------+------------------+-----+----------+-------+  DP      142               0.79 monophasic         +---------+------------------+-----+----------+-------+  Great Toe60                0.33                    +---------+------------------+-----+----------+-------+   +-------+-----------+-----------+------------+------------+  ABI/TBIToday's ABIToday's TBIPrevious ABIPrevious TBI  +-------+-----------+-----------+------------+------------+  Right BKA        BKA        BKA         BKA           +-------+-----------+-----------+------------+------------+  Left  0.84       0.33       1.04        0.80          +-------+-----------+-----------+------------+------------+        Left ABIs and TBIs appear decreased compared to prior study on 11/21/17.    Summary:  Left: Resting left ankle-brachial index indicates mild left lower  extremity arterial disease.  Left toe pressure is >60 mmHg which suggests adequate perfusion for  healing.      Assessment/Plan:     73 year old male with history of right BKA and slow to heal left foot ulcer secondary to low-grade trauma with marginal blood flow for healing.  He does not have palpable pulses on the left beyond the left common femoral.  We have discussed continued watchful waiting versus angiography.  Will plan for angiography from a right common femoral approach to include the left lower extremity with  possible intervention. We discussed the risk benefits and alternatives he demonstrates good understanding and we will proceed on a Monday in the near future.  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 The patient is on best medical therapy for peripheral arterial disease. The patient has been counseled about the risks of tobacco use in atherosclerotic disease. The patient has been counseled to abstain from any tobacco use. An aortogram with bilateral lower extremity runoff angiography and Left lower extremity intervention and is indicated to better evaluate the patient's lower extremity circulation because of the limb threatening nature of the patient's diagnosis. Based on the patient's clinical exam and non-invasive data, we anticipate an endovascular intervention in the femoropopliteal vessels. Stenting would be favored because of the improved primary patency of these interventions as compared to plain balloon angioplasty.     Penne Lonni Colorado MD [facility]

## 2024-02-08 NOTE — Progress Notes (Signed)
 Patient ID: Calvin Mckee, male   DOB: 1950/12/14, 73 y.o.   MRN: [id number]  Reason for Consult: New Patient (Initial Visit)   Referred by Calvin Atlas, MD  Subjective:     HPI:  Calvin Mckee is a 73 y.o. male with history of stenting of his right lower extremity ultimately required right below-knee amputation.  He had a new prosthetic made today at [facility] clinic decorated with the [employer] and states that this is fitting very well.  He continues to go to the gym 4 to 5 days a week.  He is a former smoker quit 10 years ago.  He does have hypertension his underlying risk factor.  Now has a small ulcer on his left foot where he had a small skin tear.  He thinks this is healing without of antibiotics but has been persistent for several weeks.  He does not have any claudication in the left lower extremity.  He is on Plavix  as single agent antiplatelet.  Past Medical History:  Diagnosis Date   Arthritis    Hypertension    PAD (peripheral artery disease)    Family History  Family history unknown: Yes   Past Surgical History:  Procedure Laterality Date   ABDOMINAL AORTOGRAM N/A 07/23/2016   Procedure: Abdominal Aortogram;  Surgeon: Carlin FORBES Haddock, MD;  Location: [facility] INVASIVE CV LAB;  Service: Cardiovascular;  Laterality: N/A;   AMPUTATION Right 07/27/2016   Procedure: RIGHT BELOW KNEE AMPUTATION;  Surgeon: Penne Lonni Colorado, MD;  Location: [facility] OR;  Service: Vascular;  Laterality: Right;   CERVICAL FUSION     LOWER EXTREMITY ANGIOGRAPHY Right 07/23/2016   Procedure: Lower Extremity Angiography;  Surgeon: Carlin FORBES Haddock, MD;  Location: [facility] INVASIVE CV LAB;  Service: Cardiovascular;  Laterality: Right;   PERIPHERAL VASCULAR INTERVENTION Right 07/23/2016   Procedure: Peripheral Vascular Intervention;  Surgeon: Carlin FORBES Haddock, MD;  Location: [facility] INVASIVE CV LAB;  Service: Cardiovascular;  Laterality: Right;  SFA     Short Social History:  Social History   Tobacco Use    Smoking status: Former    Current packs/day: 0.00    Average packs/day: 1 pack/day for 24.2 years (24.2 ttl pk-yrs)    Types: Cigarettes    Start date: 23    Quit date: 06/2018    Years since quitting: 5.6    Passive exposure: Current   Smokeless tobacco: Never  Substance Use Topics   Alcohol use: Yes    Comment: occassionally    Allergies  Allergen Reactions   Ace Inhibitors Cough   Angiotensin Cough   No Known Allergies     Current Outpatient Medications  Medication Sig Dispense Refill   amLODipine  (NORVASC ) 10 MG tablet Take 1 tablet (10 mg total) by mouth daily. 30 tablet 0   cholecalciferol  (VITAMIN D) 1000 units tablet Take 1,000 Units by mouth 2 (two) times daily.     clopidogrel  (PLAVIX ) 75 MG tablet Take 1 tablet (75 mg total) by mouth daily.     fluticasone  (FLONASE ) 50 MCG/ACT nasal spray Place 1 spray into both nostrils daily. 16 g 2   gabapentin  (NEURONTIN ) 300 MG capsule Take 1 capsule (300 mg total) by mouth 2 (two) times daily. 10 capsule 0   hydrochlorothiazide (HYDRODIURIL) 50 MG tablet Take 50 mg by mouth daily.     loratadine  (CLARITIN ) 10 MG tablet Take 1 tablet (10 mg total) by mouth daily. 30 tablet 0   losartan (COZAAR) 50 MG tablet Take 50  mg by mouth daily.     magnesium  oxide (MAG-OX) 400 MG tablet Take 1 tablet (400 mg total) by mouth daily. 30 tablet 0   Multiple Vitamin (MULTIVITAMIN WITH MINERALS) TABS tablet Take 1 tablet by mouth daily.     mupirocin  ointment (BACTROBAN ) 2 % Apply 1 application topically 2 (two) times daily. To affected area till better 22 g 0   olopatadine  (PATADAY ) 0.1 % ophthalmic solution Place 1 drop into both eyes 2 (two) times daily. 5 mL 12   omeprazole (PRILOSEC) 10 MG capsule Take 10 mg by mouth daily.     sertraline (ZOLOFT) 100 MG tablet TAKE ONE-HALF TABLET BY MOUTH DAILY FOR MENTAL HEALTH     sildenafil (VIAGRA) 100 MG tablet TAKE ONE-HALF TABLET BY MOUTH AS INSTRUCTED (TAKE 1 HOUR PRIOR TO SEXUAL ACTIVITY *DO NOT  EXCEED 1 DOSE PER 24 HOUR PERIOD*)     No current facility-administered medications for this visit.    Review of Systems  Constitutional:  Constitutional negative. HENT: HENT negative.  Eyes: Eyes negative.  Respiratory: Respiratory negative.  Cardiovascular: Cardiovascular negative.  GI: Gastrointestinal negative.  Musculoskeletal: Musculoskeletal negative.  Skin:       Left foot ulcer Neurological: Neurological negative. Hematologic: Hematologic/lymphatic negative.  Psychiatric: Psychiatric negative.        Objective:  Objective   Vitals:   02/08/24 1357  BP: (!) 171/80  Pulse: 85  Temp: 98.2 F (36.8 C)  SpO2: 92%  Weight: 200 lb (90.7 kg)  Height: 5' 7 (1.702 m)   Body mass index is 31.32 kg/m.  Physical Exam HENT:     Head: Normocephalic.     Nose: Nose normal.     Mouth/Throat:     Mouth: Mucous membranes are moist.  Cardiovascular:     Rate and Rhythm: Normal rate.     Pulses:          Femoral pulses are 1+ on the right side and 2+ on the left side.      Popliteal pulses are 0 on the left side.       Dorsalis pedis pulses are 0 on the left side.       Posterior tibial pulses are 0 on the left side.  Pulmonary:     Effort: Pulmonary effort is normal.  Abdominal:     General: Abdomen is flat.  Musculoskeletal:     Comments: Right lower extremity prosthetic  Neurological:     General: No focal deficit present.     Mental Status: He is alert.     Data: ABI Findings:  +---------+------------------+-----+--------+--------+  Right   Rt Pressure (mmHg)IndexWaveformComment   +---------+------------------+-----+--------+--------+  Brachial 173                                      +---------+------------------+-----+--------+--------+  PTA                                    BKA       +---------+------------------+-----+--------+--------+  DP                                     BKA        +---------+------------------+-----+--------+--------+  Great Toe  BKA       +---------+------------------+-----+--------+--------+   +---------+------------------+-----+----------+-------+  Left    Lt Pressure (mmHg)IndexWaveform  Comment  +---------+------------------+-----+----------+-------+  Brachial 180                                       +---------+------------------+-----+----------+-------+  PTA     151               0.84 biphasic           +---------+------------------+-----+----------+-------+  DP      142               0.79 monophasic         +---------+------------------+-----+----------+-------+  Great Toe60                0.33                    +---------+------------------+-----+----------+-------+   +-------+-----------+-----------+------------+------------+  ABI/TBIToday's ABIToday's TBIPrevious ABIPrevious TBI  +-------+-----------+-----------+------------+------------+  Right BKA        BKA        BKA         BKA           +-------+-----------+-----------+------------+------------+  Left  0.84       0.33       1.04        0.80          +-------+-----------+-----------+------------+------------+        Left ABIs and TBIs appear decreased compared to prior study on 11/21/17.    Summary:  Left: Resting left ankle-brachial index indicates mild left lower  extremity arterial disease.  Left toe pressure is >60 mmHg which suggests adequate perfusion for  healing.      Assessment/Plan:     73 year old male with history of right BKA and slow to heal left foot ulcer secondary to low-grade trauma with marginal blood flow for healing.  He does not have palpable pulses on the left beyond the left common femoral.  We have discussed continued watchful waiting versus angiography.  Will plan for angiography from a right common femoral approach to include the left lower extremity with  possible intervention. We discussed the risk benefits and alternatives he demonstrates good understanding and we will proceed on a Monday in the near future.  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 The patient is on best medical therapy for peripheral arterial disease. The patient has been counseled about the risks of tobacco use in atherosclerotic disease. The patient has been counseled to abstain from any tobacco use. An aortogram with bilateral lower extremity runoff angiography and Left lower extremity intervention and is indicated to better evaluate the patient's lower extremity circulation because of the limb threatening nature of the patient's diagnosis. Based on the patient's clinical exam and non-invasive data, we anticipate an endovascular intervention in the femoropopliteal vessels. Stenting would be favored because of the improved primary patency of these interventions as compared to plain balloon angioplasty.     Penne Lonni Colorado MD [facility]

## 2024-02-13 ENCOUNTER — Observation Stay (HOSPITAL_COMMUNITY)
Admission: RE | Admit: 2024-02-13 | Discharge: 2024-02-14 | Disposition: A | Discharge status: Home / Self Care | Attending: Vascular Surgery | Admitting: Vascular Surgery

## 2024-02-13 ENCOUNTER — Ambulatory Visit (HOSPITAL_COMMUNITY): Admission: RE | Disposition: A | Payer: Self-pay | Source: Home / Self Care | Attending: Vascular Surgery

## 2024-02-13 ENCOUNTER — Other Ambulatory Visit: Payer: Self-pay

## 2024-02-13 DIAGNOSIS — Z7982 Long term (current) use of aspirin: Secondary | ICD-10-CM | POA: Diagnosis not present

## 2024-02-13 DIAGNOSIS — I70245 Atherosclerosis of native arteries of left leg with ulceration of other part of foot: Principal | ICD-10-CM | POA: Insufficient documentation

## 2024-02-13 DIAGNOSIS — I739 Peripheral vascular disease, unspecified: Secondary | ICD-10-CM | POA: Diagnosis present

## 2024-02-13 DIAGNOSIS — Z7902 Long term (current) use of antithrombotics/antiplatelets: Secondary | ICD-10-CM | POA: Insufficient documentation

## 2024-02-13 DIAGNOSIS — Z79899 Other long term (current) drug therapy: Secondary | ICD-10-CM | POA: Insufficient documentation

## 2024-02-13 DIAGNOSIS — I10 Essential (primary) hypertension: Secondary | ICD-10-CM | POA: Diagnosis not present

## 2024-02-13 DIAGNOSIS — Z87891 Personal history of nicotine dependence: Secondary | ICD-10-CM | POA: Insufficient documentation

## 2024-02-13 DIAGNOSIS — Z89511 Acquired absence of right leg below knee: Secondary | ICD-10-CM | POA: Diagnosis not present

## 2024-02-13 DIAGNOSIS — I7025 Atherosclerosis of native arteries of other extremities with ulceration: Secondary | ICD-10-CM

## 2024-02-13 DIAGNOSIS — L97529 Non-pressure chronic ulcer of other part of left foot with unspecified severity: Secondary | ICD-10-CM | POA: Diagnosis not present

## 2024-02-13 HISTORY — PX: LOWER EXTREMITY INTERVENTION: CATH118252

## 2024-02-13 HISTORY — PX: LOWER EXTREMITY ANGIOGRAPHY: CATH118251

## 2024-02-13 HISTORY — PX: ABDOMINAL AORTOGRAM W/LOWER EXTREMITY: CATH118223

## 2024-02-13 LAB — CBC
HCT: 37 % — ABNORMAL LOW (ref 39.0–52.0)
Hemoglobin: 12.4 g/dL — ABNORMAL LOW (ref 13.0–17.0)
MCH: 28.1 pg (ref 26.0–34.0)
MCHC: 33.5 g/dL (ref 30.0–36.0)
MCV: 83.9 fL (ref 80.0–100.0)
Platelets: 308 K/uL (ref 150–400)
RBC: 4.41 MIL/uL (ref 4.22–5.81)
RDW: 14 % (ref 11.5–15.5)
WBC: 6 K/uL (ref 4.0–10.5)
nRBC: 0 % (ref 0.0–0.2)

## 2024-02-13 LAB — POCT I-STAT, CHEM 8
BUN: 10 mg/dL (ref 8–23)
Calcium, Ion: 1.24 mmol/L (ref 1.15–1.40)
Chloride: 97 mmol/L — ABNORMAL LOW (ref 98–111)
Creatinine, Ser: 1.1 mg/dL (ref 0.61–1.24)
Glucose, Bld: 101 mg/dL — ABNORMAL HIGH (ref 70–99)
HCT: 39 % (ref 39.0–52.0)
Hemoglobin: 13.3 g/dL (ref 13.0–17.0)
Potassium: 4.1 mmol/L (ref 3.5–5.1)
Sodium: 137 mmol/L (ref 135–145)
TCO2: 27 mmol/L (ref 22–32)

## 2024-02-13 LAB — CREATININE, SERUM
Creatinine, Ser: 1.16 mg/dL (ref 0.61–1.24)
GFR, Estimated: >60 mL/min (ref >60)

## 2024-02-13 MED ORDER — SODIUM CHLORIDE 0.9% FLUSH
3.0000 mL | Freq: Two times a day (BID) | INTRAVENOUS | Status: DC
  Administered 2024-02-13 – 2024-02-14 (×2): 3 mL via INTRAVENOUS

## 2024-02-13 MED ORDER — ASPIRIN 81 MG PO TBEC
81.0000 mg | DELAYED_RELEASE_TABLET | Freq: Every day | ORAL | Status: DC
  Administered 2024-02-13 – 2024-02-14 (×2): 81 mg via ORAL
  Filled 2024-02-13 (×2): qty 1

## 2024-02-13 MED ORDER — FENTANYL CITRATE (PF) 100 MCG/2ML IJ SOLN
INTRAMUSCULAR | Status: DC | PRN
  Administered 2024-02-13: 50 ug via INTRAVENOUS

## 2024-02-13 MED ORDER — MAGNESIUM OXIDE -MG SUPPLEMENT 400 (240 MG) MG PO TABS
400.0000 mg | ORAL_TABLET | Freq: Every day | ORAL | Status: DC
  Administered 2024-02-13 – 2024-02-14 (×2): 400 mg via ORAL
  Filled 2024-02-13 (×2): qty 1

## 2024-02-13 MED ORDER — HYDRALAZINE HCL 20 MG/ML IJ SOLN
INTRAMUSCULAR | Status: AC
  Filled 2024-02-13: qty 1

## 2024-02-13 MED ORDER — ROSUVASTATIN CALCIUM 5 MG PO TABS
10.0000 mg | ORAL_TABLET | Freq: Every day | ORAL | Status: DC
  Administered 2024-02-14: 10 mg via ORAL
  Filled 2024-02-13: qty 2

## 2024-02-13 MED ORDER — ADULT MULTIVITAMIN W/MINERALS CH
1.0000 | ORAL_TABLET | Freq: Every day | ORAL | Status: DC
  Administered 2024-02-13 – 2024-02-14 (×2): 1 via ORAL
  Filled 2024-02-13 (×2): qty 1

## 2024-02-13 MED ORDER — MORPHINE SULFATE (PF) 2 MG/ML IV SOLN: 2.0000 mg | INTRAVENOUS | Status: DC | PRN

## 2024-02-13 MED ORDER — SODIUM CHLORIDE 0.9 % IV SOLN: INTRAVENOUS | Status: DC

## 2024-02-13 MED ORDER — IODIXANOL 320 MG/ML IV SOLN
INTRAVENOUS | Status: DC | PRN
  Administered 2024-02-13: 55 mL

## 2024-02-13 MED ORDER — ACETAMINOPHEN 325 MG PO TABS
650.0000 mg | ORAL_TABLET | ORAL | Status: DC | PRN
  Administered 2024-02-13: 650 mg via ORAL
  Filled 2024-02-13: qty 2

## 2024-02-13 MED ORDER — HYDROCHLOROTHIAZIDE 25 MG PO TABS
50.0000 mg | ORAL_TABLET | Freq: Every day | ORAL | Status: DC
Start: 2024-02-14 — End: 2024-02-14
  Administered 2024-02-14: 50 mg via ORAL
  Filled 2024-02-13 (×3): qty 2

## 2024-02-13 MED ORDER — SODIUM CHLORIDE 0.9 % IV SOLN: INTRAVENOUS | Status: AC

## 2024-02-13 MED ORDER — OXYCODONE HCL 5 MG PO TABS: 5.0000 mg | ORAL_TABLET | ORAL | Status: DC | PRN

## 2024-02-13 MED ORDER — SERTRALINE HCL 100 MG PO TABS
100.0000 mg | ORAL_TABLET | Freq: Every day | ORAL | Status: DC
  Administered 2024-02-14: 100 mg via ORAL
  Filled 2024-02-13: qty 1

## 2024-02-13 MED ORDER — SILDENAFIL CITRATE 100 MG PO TABS: 50.0000 mg | ORAL_TABLET | ORAL | Status: DC | PRN

## 2024-02-13 MED ORDER — MIDAZOLAM HCL 2 MG/2ML IJ SOLN
INTRAMUSCULAR | Status: AC
  Filled 2024-02-13: qty 2

## 2024-02-13 MED ORDER — MIDAZOLAM HCL (PF) 2 MG/2ML IJ SOLN
INTRAMUSCULAR | Status: DC | PRN
Start: 2024-02-13 — End: 2024-02-13
  Administered 2024-02-13: 1 mg via INTRAVENOUS

## 2024-02-13 MED ORDER — ONDANSETRON HCL 4 MG/2ML IJ SOLN: 4.0000 mg | Freq: Four times a day (QID) | INTRAMUSCULAR | Status: DC | PRN

## 2024-02-13 MED ORDER — LIDOCAINE HCL (PF) 1 % IJ SOLN
INTRAMUSCULAR | Status: AC
  Filled 2024-02-13: qty 30

## 2024-02-13 MED ORDER — HYDRALAZINE HCL 20 MG/ML IJ SOLN: 5.0000 mg | INTRAMUSCULAR | Status: DC | PRN

## 2024-02-13 MED ORDER — SODIUM CHLORIDE 0.9% FLUSH: 3.0000 mL | INTRAVENOUS | Status: DC | PRN

## 2024-02-13 MED ORDER — SODIUM CHLORIDE 0.9 % IV SOLN: 250.0000 mL | INTRAVENOUS | Status: DC | PRN

## 2024-02-13 MED ORDER — PANTOPRAZOLE SODIUM 40 MG PO TBEC
40.0000 mg | DELAYED_RELEASE_TABLET | Freq: Every day | ORAL | Status: DC
  Administered 2024-02-13 – 2024-02-14 (×2): 40 mg via ORAL
  Filled 2024-02-13 (×2): qty 1

## 2024-02-13 MED ORDER — HYDRALAZINE HCL 20 MG/ML IJ SOLN
INTRAMUSCULAR | Status: DC | PRN
  Administered 2024-02-13: 20 mg via INTRAVENOUS

## 2024-02-13 MED ORDER — LIDOCAINE HCL (PF) 1 % IJ SOLN
INTRAMUSCULAR | Status: DC | PRN
  Administered 2024-02-13: 15 mL

## 2024-02-13 MED ORDER — HEPARIN SODIUM (PORCINE) 5000 UNIT/ML IJ SOLN
5000.0000 [IU] | Freq: Three times a day (TID) | INTRAMUSCULAR | Status: DC
  Administered 2024-02-13 – 2024-02-14 (×2): 5000 [IU] via SUBCUTANEOUS
  Filled 2024-02-13 (×2): qty 1

## 2024-02-13 MED ORDER — LABETALOL HCL 5 MG/ML IV SOLN: 10.0000 mg | INTRAVENOUS | Status: DC | PRN

## 2024-02-13 MED ORDER — LOSARTAN POTASSIUM 50 MG PO TABS
50.0000 mg | ORAL_TABLET | Freq: Every day | ORAL | Status: DC
  Administered 2024-02-14: 50 mg via ORAL
  Filled 2024-02-13: qty 1

## 2024-02-13 MED ORDER — HEPARIN (PORCINE) IN NACL 2000-0.9 UNIT/L-% IV SOLN
INTRAVENOUS | Status: DC | PRN
  Administered 2024-02-13: 1000 mL

## 2024-02-13 MED ORDER — FENTANYL CITRATE (PF) 100 MCG/2ML IJ SOLN
INTRAMUSCULAR | Status: AC
  Filled 2024-02-13: qty 2

## 2024-02-13 NOTE — Op Note (Signed)
    Patient name: Calvin Mckee MRN: [id number] DOB: Mar 06, 1951 Sex: male  02/13/2024 Pre-operative Diagnosis: Atherosclerosis native arteries with left lower extremity ulceration Post-operative diagnosis:  Same Surgeon:  Penne BROCKS. Sheree, MD Procedure Performed: 1.  Percutaneous ultrasound-guided cannulation right common femoral artery 2.  Catheter in aorta and aortogram with bilateral lower extremity angiography 3.  Catheter selection left common femoral artery 4.  Moderate sedation with fentanyl  and Versed  for 19 minutes  Indications: 73 year old male history of right lower extremity below-knee amputation now with left lower extremity foot ulceration and mildly depressed ABI without distal palpable pulses.  He is now indicated for angiography with possible invention.  Findings: Aorta and iliac segments are free of luminal stenosis however heavily calcified.  Both common femoral arteries are patent without flow limitation.  The left SFA and popliteal arteries are heavily calcified.  Runoff is via the posterior tibial as the dominant and peroneal runoff secondarily and the foot is filled briskly via the posterior tibial artery.  Patient does not have any occlusive disease to intervene upon.   Procedure:  The patient was identified in the holding area and taken to room 8.  The patient was then placed supine on the table and prepped and draped in the usual sterile fashion.  A time out was called.  Ultrasound was used to evaluate the right common femoral artery which was calcified.  We were able to cannulate this in a softer area more cephalad near the external iliac artery using micropuncture followed by wire and sheath.  Ultrasound images saved the permanent record.  Concomitantly we administered fentanyl  and Versed  with moderate sedation as vital signs were monitored throughout the case.  We placed a Bentson wire followed by 5 French sheath and Omni catheter to the level of L1 and performed  aortogram followed by pelvic angiography including bilateral common femoral arteries.  We then crossed the bifurcation with Bentson and Omni catheter perform left lower extremity angiography and we elected for no intervention.  We then removed the catheter over wire.  Sheath will be pulled in postoperative holding.  He tolerated the procedure without any complication.  Contrast: 55cc   Rennie Hack C. Sheree, MD [facility] Office: [phone] Pager: [phone]

## 2024-02-13 NOTE — Interval H&P Note (Signed)
 History and Physical Interval Note:  02/13/2024 8:29 AM  Calvin Mckee  has presented today for surgery, with the diagnosis of atherosclerosis bilaterial with ulcers.  The various methods of treatment have been discussed with the patient and family. After consideration of risks, benefits and other options for treatment, the patient has consented to  Procedure(s): ABDOMINAL AORTOGRAM W/LOWER EXTREMITY (N/A) Lower Extremity Angiography (N/A) LOWER EXTREMITY INTERVENTION (N/A) as a surgical intervention.  The patient's history has been reviewed, patient examined, no change in status, stable for surgery.  I have reviewed the patient's chart and labs.  Questions were answered to the patient's satisfaction.     Penne Colorado

## 2024-02-13 NOTE — Progress Notes (Signed)
 Site area: R fem art 5Fr sheath Site Prior to Removal:  Level 0 Pressure Applied For: Manual:   yes Patient Status During Pull:  stable Post Pull Site:  Level 0 Post Pull Instructions Given:  yes with teach back Post Pull Pulses Present: R pop  Dressing Applied:  gauze and tegaderm Bedrest begins @ 1340

## 2024-02-13 NOTE — Progress Notes (Signed)
 Patient arrived at the unit from Cath lab,upon arrival pt is alert and oriented X4,CHG bath given,vitals taken,rt groin incision level 0,lt  dorsalis pedis pulse doppler,pt oriented to the unit,call bell in reach

## 2024-02-14 ENCOUNTER — Encounter (HOSPITAL_COMMUNITY): Payer: Self-pay | Admitting: Vascular Surgery

## 2024-02-14 ENCOUNTER — Other Ambulatory Visit (HOSPITAL_COMMUNITY): Payer: Self-pay

## 2024-02-14 DIAGNOSIS — I70245 Atherosclerosis of native arteries of left leg with ulceration of other part of foot: Secondary | ICD-10-CM | POA: Diagnosis not present

## 2024-02-14 LAB — CBC
HCT: 39.5 % (ref 39.0–52.0)
Hemoglobin: 13.2 g/dL (ref 13.0–17.0)
MCH: 28.6 pg (ref 26.0–34.0)
MCHC: 33.4 g/dL (ref 30.0–36.0)
MCV: 85.5 fL (ref 80.0–100.0)
Platelets: 276 K/uL (ref 150–400)
RBC: 4.62 MIL/uL (ref 4.22–5.81)
RDW: 14.1 % (ref 11.5–15.5)
WBC: 5.3 K/uL (ref 4.0–10.5)
nRBC: 0 % (ref 0.0–0.2)

## 2024-02-14 LAB — BASIC METABOLIC PANEL WITH GFR
Anion gap: 11 (ref 5–15)
BUN: 11 mg/dL (ref 8–23)
CO2: 27 mmol/L (ref 22–32)
Calcium: 9.3 mg/dL (ref 8.9–10.3)
Chloride: 100 mmol/L (ref 98–111)
Creatinine, Ser: 1.15 mg/dL (ref 0.61–1.24)
GFR, Estimated: >60 mL/min (ref >60)
Glucose, Bld: 96 mg/dL (ref 70–99)
Potassium: 4.4 mmol/L (ref 3.5–5.1)
Sodium: 138 mmol/L (ref 135–145)

## 2024-02-14 MED ORDER — ASPIRIN 81 MG PO TBEC
81.0000 mg | DELAYED_RELEASE_TABLET | Freq: Every day | ORAL | 12 refills | Status: AC
  Filled 2024-02-14: qty 30, 30d supply, fill #0

## 2024-02-14 NOTE — Progress Notes (Signed)
  Progress Note    02/14/2024 7:55 AM 1 Day Post-Op  Subjective:  no complaints    Vitals:   02/13/24 2341 02/14/24 0422  BP: 139/80 (!) 143/75  Pulse: 81 75  Resp:  12  Temp: 98.4 F (36.9 C) 98.7 F (37.1 C)  SpO2:      Physical Exam: General: Resting in bed, NAD Cardiac: Regular Lungs: Nonlabored Incisions: Right CFA access site soft, intact, and dry Extremities: Left foot warm and well-perfused with brisk PT Doppler signal   CBC    Component Value Date/Time   WBC 5.3 02/14/2024 0257   RBC 4.62 02/14/2024 0257   HGB 13.2 02/14/2024 0257   HCT 39.5 02/14/2024 0257   PLT 276 02/14/2024 0257   MCV 85.5 02/14/2024 0257   MCH 28.6 02/14/2024 0257   MCHC 33.4 02/14/2024 0257   RDW 14.1 02/14/2024 0257   LYMPHSABS 3.1 01/10/2024 2014   MONOABS 0.4 01/10/2024 2014   EOSABS 0.2 01/10/2024 2014   BASOSABS 0.0 01/10/2024 2014    BMET    Component Value Date/Time   NA 138 02/14/2024 0257   NA 139 08/02/2016 0000   K 4.4 02/14/2024 0257   CL 100 02/14/2024 0257   CO2 27 02/14/2024 0257   GLUCOSE 96 02/14/2024 0257   BUN 11 02/14/2024 0257   BUN 7 08/02/2016 0000   CREATININE 1.15 02/14/2024 0257   CALCIUM 9.3 02/14/2024 0257   GFRNONAA >60 02/14/2024 0257   GFRAA >60 07/29/2016 0200    INR    Component Value Date/Time   INR 1.14 07/23/2016 0540     Intake/Output Summary (Last 24 hours) at 02/14/2024 0755 Last data filed at 02/14/2024 0300 Gross per 24 hour  Intake 683.83 ml  Output 700 ml  Net -16.17 ml      Assessment/Plan:  73 y.o. male is 1 day postop, s/p: Left lower extremity diagnostic angiogram   - The patient underwent left lower extremity angiogram yesterday for a foot ulceration without palpable distal pulses -Angiography did not demonstrate any occlusive disease amenable to intervention -Right groin cath site is soft and dry without hematoma -Left lower extremity remains well-perfused with a brisk PT Doppler signal -Will arrange  follow-up in a few weeks and discharge home today  Ahmed Holster, PA-C [facility] [phone] 02/14/2024 7:55 AM

## 2024-02-14 NOTE — Plan of Care (Signed)
  Problem: Education: Goal: Knowledge of General Education information will improve Description: Including pain rating scale, medication(s)/side effects and non-pharmacologic comfort measures Outcome: Progressing   Problem: Clinical Measurements: Goal: Ability to maintain clinical measurements within normal limits will improve Outcome: Progressing Goal: Will remain free from infection Outcome: Progressing Goal: Diagnostic test results will improve Outcome: Progressing Goal: Respiratory complications will improve Outcome: Progressing Goal: Cardiovascular complication will be avoided Outcome: Progressing   Problem: Activity: Goal: Risk for activity intolerance will decrease Outcome: Progressing   Problem: Coping: Goal: Level of anxiety will decrease Outcome: Progressing   Problem: Elimination: Goal: Will not experience complications related to bowel motility Outcome: Progressing Goal: Will not experience complications related to urinary retention Outcome: Progressing   Problem: Pain Managment: Goal: General experience of comfort will improve and/or be controlled Outcome: Progressing   Problem: Safety: Goal: Ability to remain free from injury will improve Outcome: Progressing   Problem: Skin Integrity: Goal: Risk for impaired skin integrity will decrease Outcome: Progressing   Problem: Cardiovascular: Goal: Ability to achieve and maintain adequate cardiovascular perfusion will improve Outcome: Progressing Goal: Vascular access site(s) Level 0-1 will be maintained Outcome: Progressing

## 2024-02-14 NOTE — Progress Notes (Signed)
 Reviewed AVS, patient expressed understanding of medications, MD follow up reviewed.   Removed IV, Site clean, dry and intact.  Patient states all belongings brought to the hospital at time of admission are accounted for and packed to take home.  Picked up medications from [facility] pharmacy. [facility] contacted to transport patient to entrance A, where family member was waiting in vehicle to transport home.

## 2024-02-14 NOTE — Progress Notes (Signed)
   02/14/24 1145  TOC Brief Assessment  Insurance and Status Reviewed  Patient has primary care physician Yes  Home environment has been reviewed home/apartment  Prior level of function: self/independent  Prior/Current Home Services No current home services  Social Drivers of Health Review SDOH reviewed no interventions necessary  Readmission risk has been reviewed Yes  Transition of care needs no transition of care needs at this time    Pt stable for transition home, meds to be picked up at [facility] pharmacy.

## 2024-02-15 MED FILL — Hydralazine HCl Inj 20 MG/ML: INTRAMUSCULAR | Qty: 1 | Status: AC

## 2024-02-17 NOTE — Discharge Summary (Signed)
 Discharge Summary  Patient ID: Calvin Mckee [id number] 73 y.o. 01-May-1950  Admit date: 02/13/2024  Discharge date and time: 02/14/2024  9:33 AM   Admitting Physician: Penne Lonni [place], MD   Discharge Physician: Penne Lonni [place], MD   Admission Diagnoses: PAD (peripheral artery disease) [I73.9]  Discharge Diagnoses: PAD (peripheral artery disease) [I73.9]  Admission Condition: fair  Discharged Condition: fair  Indication for Admission: 73 year old male history of right lower extremity below-knee amputation now with left lower extremity foot ulceration and mildly depressed ABI without distal palpable pulses. He is now indicated for angiography with possible invention.   Hospital Course: The patient was admitted to the hospital on 02/13/2024 and underwent left lower extremity angiogram by Dr. Colorado.  No intervention was performed given that there was no occlusive disease to intervene upon.  He was admitted to the hospital overnight for observation.  On POD 1 he was doing well without any complaints.  Right groin cath site was intact and soft without hematoma.  Left lower extremity was well-perfused with a brisk PT Doppler signal.  He was tolerating a normal diet, voiding without difficulty, and mobilizing at baseline.  He was discharged home on POD 1.  Consults: None  Treatments: anticoagulation: ASA and surgery: Diagnostic left lower extremity angiogram  Discharge Exam: See progress note  Vitals:   02/14/24 0422 02/14/24 0812  BP: (!) 143/75 132/68  Pulse: 75 82  Resp: 12 14  Temp: 98.7 F (37.1 C) 97.6 F (36.4 C)  SpO2:  96%     Disposition: Discharge disposition: 01-Home or Self Care       Patient Instructions:  Allergies as of 02/14/2024       Reactions   Ace Inhibitors Cough   Angiotensin Cough   No Known Allergies         Medication List     TAKE these medications    aspirin  EC 81 MG tablet Take 1 tablet (81 mg total) by  mouth daily. Swallow whole.   FERROUS SULFATE PO Take 1 tablet by mouth every Monday, Wednesday, and Friday.   fluticasone  50 MCG/ACT nasal spray Commonly known as: FLONASE  Place 1 spray into both nostrils daily. What changed:  when to take this reasons to take this   hydrochlorothiazide 50 MG tablet Commonly known as: HYDRODIURIL Take 50 mg by mouth daily.   losartan 100 MG tablet Commonly known as: COZAAR Take 50 mg by mouth daily.   magnesium  oxide 400 MG tablet Commonly known as: MAG-OX Take 1 tablet (400 mg total) by mouth daily. What changed:  when to take this reasons to take this   multivitamin with minerals Tabs tablet Take 1 tablet by mouth daily.   olopatadine  0.1 % ophthalmic solution Commonly known as: Pataday  Place 1 drop into both eyes 2 (two) times daily. What changed:  when to take this reasons to take this   omeprazole 40 MG capsule Commonly known as: PRILOSEC Take 40 mg by mouth every morning.   rosuvastatin 20 MG tablet Commonly known as: CRESTOR Take 10 mg by mouth daily.   sertraline 100 MG tablet Commonly known as: ZOLOFT TAKE ONE-HALF TABLET BY MOUTH DAILY FOR MENTAL HEALTH   sildenafil 100 MG tablet Commonly known as: VIAGRA Take 50 mg by mouth as needed for erectile dysfunction.       Activity: activity as tolerated, no driving for 2 weeks, and no heavy lifting for 2 weeks Diet: regular diet Wound Care: keep wound clean and dry  Follow-up with [facility] in 3 months.  SignedBETHA Ahmed Holster, PA-C 02/17/2024 11:17 AM [facility] Office: [phone]

## 2024-04-26 ENCOUNTER — Other Ambulatory Visit: Payer: Self-pay | Admitting: *Deleted

## 2024-04-26 DIAGNOSIS — I7025 Atherosclerosis of native arteries of other extremities with ulceration: Secondary | ICD-10-CM

## 2024-04-26 DIAGNOSIS — I739 Peripheral vascular disease, unspecified: Secondary | ICD-10-CM

## 2024-05-16 ENCOUNTER — Ambulatory Visit (HOSPITAL_COMMUNITY)

## 2024-07-25 ENCOUNTER — Ambulatory Visit (HOSPITAL_COMMUNITY)

## 2024-07-25 ENCOUNTER — Ambulatory Visit
